# Patient Record
Sex: Female | Born: 1990 | Hispanic: Yes | Marital: Single | Smoking: Never smoker
Health system: Southern US, Community
[De-identification: ages and names within clinical notes are randomized; demographics above are authoritative.]

## PROBLEM LIST (undated history)

## (undated) ENCOUNTER — Inpatient Hospital Stay: Payer: Self-pay

## (undated) ENCOUNTER — Inpatient Hospital Stay (HOSPITAL_COMMUNITY): Payer: Self-pay

## (undated) DIAGNOSIS — IMO0002 Reserved for concepts with insufficient information to code with codable children: Secondary | ICD-10-CM

## (undated) DIAGNOSIS — R87619 Unspecified abnormal cytological findings in specimens from cervix uteri: Secondary | ICD-10-CM

## (undated) DIAGNOSIS — D649 Anemia, unspecified: Secondary | ICD-10-CM

## (undated) DIAGNOSIS — B977 Papillomavirus as the cause of diseases classified elsewhere: Secondary | ICD-10-CM

## (undated) HISTORY — PX: CHOLECYSTECTOMY: SHX55

## (undated) HISTORY — PX: CONDYLOMA EXCISION/FULGURATION: SHX1389

## (undated) HISTORY — PX: TONSILLECTOMY: SUR1361

## (undated) NOTE — *Deleted (*Deleted)
Preventive Care 21-39 Years Old, Female Preventive care refers to visits with your health care provider and lifestyle choices that can promote health and wellness. This includes:  A yearly physical exam. This may also be called an annual well check.  Regular dental visits and eye exams.  Immunizations.  Screening for certain conditions.  Healthy lifestyle choices, such as eating a healthy diet, getting regular exercise, not using drugs or products that contain nicotine and tobacco, and limiting alcohol use. What can I expect for my preventive care visit? Physical exam Your health care provider will check your:  Height and weight. This may be used to calculate body mass index (BMI), which tells if you are at a healthy weight.  Heart rate and blood pressure.  Skin for abnormal spots. Counseling Your health care provider may ask you questions about your:  Alcohol, tobacco, and drug use.  Emotional well-being.  Home and relationship well-being.  Sexual activity.  Eating habits.  Work and work environment.  Method of birth control.  Menstrual cycle.  Pregnancy history. What immunizations do I need?  Influenza (flu) vaccine  This is recommended every year. Tetanus, diphtheria, and pertussis (Tdap) vaccine  You may need a Td booster every 10 years. Varicella (chickenpox) vaccine  You may need this if you have not been vaccinated. Human papillomavirus (HPV) vaccine  If recommended by your health care provider, you may need three doses over 6 months. Measles, mumps, and rubella (MMR) vaccine  You may need at least one dose of MMR. You may also need a second dose. Meningococcal conjugate (MenACWY) vaccine  One dose is recommended if you are age 19-21 years and a first-year college student living in a residence hall, or if you have one of several medical conditions. You may also need additional booster doses. Pneumococcal conjugate (PCV13) vaccine  You may need  this if you have certain conditions and were not previously vaccinated. Pneumococcal polysaccharide (PPSV23) vaccine  You may need one or two doses if you smoke cigarettes or if you have certain conditions. Hepatitis A vaccine  You may need this if you have certain conditions or if you travel or work in places where you may be exposed to hepatitis A. Hepatitis B vaccine  You may need this if you have certain conditions or if you travel or work in places where you may be exposed to hepatitis B. Haemophilus influenzae type b (Hib) vaccine  You may need this if you have certain conditions. You may receive vaccines as individual doses or as more than one vaccine together in one shot (combination vaccines). Talk with your health care provider about the risks and benefits of combination vaccines. What tests do I need?  Blood tests  Lipid and cholesterol levels. These may be checked every 5 years starting at age 20.  Hepatitis C test.  Hepatitis B test. Screening  Diabetes screening. This is done by checking your blood sugar (glucose) after you have not eaten for a while (fasting).  Sexually transmitted disease (STD) testing.  BRCA-related cancer screening. This may be done if you have a family history of breast, ovarian, tubal, or peritoneal cancers.  Pelvic exam and Pap test. This may be done every 3 years starting at age 21. Starting at age 30, this may be done every 5 years if you have a Pap test in combination with an HPV test. Talk with your health care provider about your test results, treatment options, and if necessary, the need for more tests.   Follow these instructions at home: Eating and drinking   Eat a diet that includes fresh fruits and vegetables, whole grains, lean protein, and low-fat dairy.  Take vitamin and mineral supplements as recommended by your health care provider.  Do not drink alcohol if: ? Your health care provider tells you not to drink. ? You are  pregnant, may be pregnant, or are planning to become pregnant.  If you drink alcohol: ? Limit how much you have to 0-1 drink a day. ? Be aware of how much alcohol is in your drink. In the [place] one drink equals one 12 oz bottle of beer (355 mL), one 5 oz glass of wine (148 mL), or one 1 oz glass of hard liquor (44 mL). Lifestyle  Take daily care of your teeth and gums.  Stay active. Exercise for at least 30 minutes on 5 or more days each week.  Do not use any products that contain nicotine or tobacco, such as cigarettes, e-cigarettes, and chewing tobacco. If you need help quitting, ask your health care provider.  If you are sexually active, practice safe sex. Use a condom or other form of birth control (contraception) in order to prevent pregnancy and STIs (sexually transmitted infections). If you plan to become pregnant, see your health care provider for a preconception visit. What's next?  Visit your health care provider once a year for a well check visit.  Ask your health care provider how often you should have your eyes and teeth checked.  Stay up to date on all vaccines. This information is not intended to replace advice given to you by your health care provider. Make sure you discuss any questions you have with your health care provider. Document Revised: 05/07/2018 Document Reviewed: 05/07/2018 Elsevier Patient Education  2020 [employer] Breast Self-Awareness Breast self-awareness is knowing how your breasts look and feel. Doing breast self-awareness is important. It allows you to catch a breast problem early while it is still small and can be treated. All women should do breast self-awareness, including women who have had breast implants. Tell your doctor if you notice a change in your breasts. What you need:  A mirror.  A well-lit room. How to do a breast self-exam A breast self-exam is one way to learn what is normal for your breasts and to check for changes. To do a  breast self-exam: Look for changes  1. Take off all the clothes above your waist. 2. Stand in front of a mirror in a room with good lighting. 3. Put your hands on your hips. 4. Push your hands down. 5. Look at your breasts and nipples in the mirror to see if one breast or nipple looks different from the other. Check to see if: ? The shape of one breast is different. ? The size of one breast is different. ? There are wrinkles, dips, and bumps in one breast and not the other. 6. Look at each breast for changes in the skin, such as: ? Redness. ? Scaly areas. 7. Look for changes in your nipples, such as: ? Liquid around the nipples. ? Bleeding. ? Dimpling. ? Redness. ? A change in where the nipples are. Feel for changes  1. Lie on your back on the floor. 2. Feel each breast. To do this, follow these steps: ? Pick a breast to feel. ? Put the arm closest to that breast above your head. ? Use your other arm to feel the nipple area of your breast. Feel   the area with the pads of your three middle fingers by making small circles with your fingers. For the first circle, press lightly. For the second circle, press harder. For the third circle, press even harder. ? Keep making circles with your fingers at the different pressures as you move down your breast. Stop when you feel your ribs. ? Move your fingers a little toward the center of your body. ? Start making circles with your fingers again, this time going up until you reach your collarbone. ? Keep making up-and-down circles until you reach your armpit. Remember to keep using the three pressures. ? Feel the other breast in the same way. 3. Sit or stand in the tub or shower. 4. With soapy water on your skin, feel each breast the same way you did in step 2 when you were lying on the floor. Write down what you find Writing down what you find can help you remember what to tell your doctor. Write down:  What is normal for each breast.  Any  changes you find in each breast, including: ? The kind of changes you find. ? Whether you have pain. ? Size and location of any lumps.  When you last had your menstrual period. General tips  Check your breasts every month.  If you are breastfeeding, the best time to check your breasts is after you feed your baby or after you use a breast pump.  If you get menstrual periods, the best time to check your breasts is 5-7 days after your menstrual period is over.  With time, you will become comfortable with the self-exam, and you will begin to know if there are changes in your breasts. Contact a doctor if you:  See a change in the shape or size of your breasts or nipples.  See a change in the skin of your breast or nipples, such as red or scaly skin.  Have fluid coming from your nipples that is not normal.  Find a lump or thick area that was not there before.  Have pain in your breasts.  Have any concerns about your breast health. Summary  Breast self-awareness includes looking for changes in your breasts, as well as feeling for changes within your breasts.  Breast self-awareness should be done in front of a mirror in a well-lit room.  You should check your breasts every month. If you get menstrual periods, the best time to check your breasts is 5-7 days after your menstrual period is over.  Let your doctor know of any changes you see in your breasts, including changes in size, changes on the skin, pain or tenderness, or fluid from your nipples that is not normal. This information is not intended to replace advice given to you by your health care provider. Make sure you discuss any questions you have with your health care provider. Document Revised: 04/14/2018 Document Reviewed: 04/14/2018 Elsevier Patient Education  2020 Elsevier Inc.  

---

## 2010-05-19 ENCOUNTER — Inpatient Hospital Stay: Payer: Self-pay | Admitting: Unknown Physician Specialty

## 2011-03-03 ENCOUNTER — Emergency Department: Payer: Self-pay | Admitting: Internal Medicine

## 2011-03-16 ENCOUNTER — Emergency Department: Payer: Self-pay | Admitting: Emergency Medicine

## 2011-06-06 ENCOUNTER — Encounter: Payer: Self-pay | Admitting: Maternal & Fetal Medicine

## 2011-06-08 LAB — CBC
HCT: 33 % — AB (ref 36–46)
Hemoglobin: 11.1 g/dL — AB (ref 12.0–16.0)
Platelets: 260 10*3/uL (ref 150–399)

## 2011-06-08 LAB — ABO/RH: RH Type: POSITIVE

## 2011-06-08 LAB — CULTURE, OB URINE
Pap: ABNORMAL — AB
Urine Culture, OB: NO GROWTH
Urine Culture, OB: NO GROWTH

## 2011-06-08 LAB — GC/CHLAMYDIA PROBE AMP, GENITAL: Chlamydia: NEGATIVE

## 2011-08-19 ENCOUNTER — Ambulatory Visit: Payer: Self-pay | Admitting: Obstetrics and Gynecology

## 2011-08-23 ENCOUNTER — Ambulatory Visit: Payer: Self-pay | Admitting: Obstetrics and Gynecology

## 2011-08-27 LAB — PATHOLOGY REPORT

## 2011-09-10 NOTE — L&D Delivery Note (Signed)
Delivery Note At 6:20 PM a viable female was delivered via Vaginal, Spontaneous Delivery (Presentation: Left Occiput Anterior).  APGAR: 8, 9; weight 7 lb 8 oz (3402 g). No difficulty with del of shoulders.  Placenta status: Intact, Spontaneous.  Cord: 3 vessels; cord blood collected for CCBB as well as hospital sample  Anesthesia: None  Episiotomy: None Lacerations: L labial; 'skidmark' on R labia (not bldg; not repaired) Suture Repair: 3.0 vicryl Est. Blood Loss (mL): 350  Mom to postpartum.  Baby to nursery-stable.  Cam Hai 12/17/2011, 6:57 PM

## 2011-10-07 ENCOUNTER — Encounter (HOSPITAL_COMMUNITY): Payer: Self-pay

## 2011-10-07 ENCOUNTER — Inpatient Hospital Stay (HOSPITAL_COMMUNITY)
Admission: AD | Admit: 2011-10-07 | Discharge: 2011-10-07 | Disposition: A | Discharge status: Home / Self Care | Payer: Medicaid Other | Source: Ambulatory Visit | Attending: Obstetrics & Gynecology | Admitting: Obstetrics & Gynecology

## 2011-10-07 DIAGNOSIS — O99891 Other specified diseases and conditions complicating pregnancy: Secondary | ICD-10-CM | POA: Insufficient documentation

## 2011-10-07 DIAGNOSIS — J069 Acute upper respiratory infection, unspecified: Secondary | ICD-10-CM

## 2011-10-07 DIAGNOSIS — J029 Acute pharyngitis, unspecified: Secondary | ICD-10-CM | POA: Insufficient documentation

## 2011-10-07 HISTORY — DX: Unspecified abnormal cytological findings in specimens from cervix uteri: R87.619

## 2011-10-07 HISTORY — DX: Reserved for concepts with insufficient information to code with codable children: IMO0002

## 2011-10-07 HISTORY — DX: Papillomavirus as the cause of diseases classified elsewhere: B97.7

## 2011-10-07 HISTORY — DX: Anemia, unspecified: D64.9

## 2011-10-07 MED ORDER — DIPHENHYDRAMINE HCL 25 MG PO CAPS: 50.0000 mg | ORAL_CAPSULE | Freq: Every evening | ORAL | Status: DC | PRN

## 2011-10-07 MED ORDER — SALINE NASAL SPRAY 0.65 % NA SOLN: 1.0000 | NASAL | Status: DC | PRN

## 2011-10-07 NOTE — ED Provider Notes (Signed)
History     Chief Complaint  Patient presents with  . Sore Throat   HPI Ms. Kimberly Dunn is a 21 y/o G1P0 who presents today at 30w 5d with a cc of sore throat, congestion, and left ear pain.  She states that the symptoms began last Monday.  She has been taking tylenol without much relief. She is especially uncomfortable at night.  She has had a milld, unproductive cough.   She denies any sick contacts, fever, nausea, vomiting, or changes in bowel habits.     Past Medical History  Diagnosis Date  . Anemia   . Abnormal Pap smear   . HPV (human papilloma virus) infection     Past Surgical History  Procedure Date  . Tonsillectomy   . Cholecystectomy   . Condyloma excision/fulguration     Family History  Problem Relation Age of Onset  . Anesthesia problems Neg Hx     History  Substance Use Topics  . Smoking status: Never Smoker   . Smokeless tobacco: Never Used  . Alcohol Use: No    Allergies: No Known Allergies  Prescriptions prior to admission  Medication Sig Dispense Refill  . ferrous fumarate-iron polysaccharide complex (TANDEM) 162-115.2 MG CAPS Take 1 capsule by mouth daily with breakfast.      . Prenatal Vit-Fe Fumarate-FA (PRENATAL MULTIVITAMIN) TABS Take 1 tablet by mouth at bedtime.        Review of Systems  All other systems reviewed and are negative.   Physical Exam   Blood pressure 115/59, pulse 105, temperature 98.5 F (36.9 C), temperature source Oral, resp. rate 20, height 5' 7.25" (1.708 m), weight 100.88 kg (222 lb 6.4 oz), SpO2 98.00%.  Physical Exam  Constitutional: She appears well-developed and well-nourished.  HENT:  Head: Normocephalic and atraumatic.  Mouth/Throat: No oropharyngeal exudate.       Erythema and "cobblestone" appearance of Posterior oropharynx.  Eyes: EOM are normal. Pupils are equal, round, and reactive to light.  Neck: Neck supple.  Cardiovascular: Normal rate and regular rhythm.   Respiratory: Effort normal.    Lymphadenopathy:    She has no cervical adenopathy.  Skin: Skin is warm and dry.  Psychiatric: Her behavior is normal.    MAU Course  Procedures  MDM NST- FHT  140, reactive with good variablity.  No decels.   Assessment and Plan  A: Viral upper respiratory infection with post nasal drip and eustachian tube involvement. Strep unlikely by C[facility]OR criteria. P:  Continue with tylenol as needed.  Benadryl at night for sleep.  Saline spray for nose and salt water gargle. D/C home with precautions to return if fever/chills, reduced fetal movement, LOF, vaginal bleeding, or contractions >4-5/hour.   Arthor Captain 22/28/2013, 10:41 AM   Sharen Counter, CNM

## 2011-10-07 NOTE — Progress Notes (Signed)
Pt states sore throat started Thursday, now affecting left ear. Has taken tylenol for pain with no relief. Has h/a since Thursday as well.

## 2011-10-07 NOTE — ED Notes (Signed)
MWilliams CNM notified of pt in [facility]U for evaluation of upper respiratory congestion, headache, earache. Provider to see pt shortly.

## 2011-10-07 NOTE — Progress Notes (Signed)
Patient states she has had a sore throat with cough and left ear pain since 1-24. No fever or fever symptoms. States she has transferred her care from the [facility] to the [facility] at [facility]. Has her first appointment 1-30. Reports no problems with the pregnancy, reports good fetal movement.

## 2011-10-09 ENCOUNTER — Ambulatory Visit (INDEPENDENT_AMBULATORY_CARE_PROVIDER_SITE_OTHER): Payer: Self-pay | Admitting: Advanced Practice Midwife

## 2011-10-09 ENCOUNTER — Other Ambulatory Visit: Payer: Self-pay | Admitting: *Deleted

## 2011-10-09 ENCOUNTER — Encounter: Payer: Self-pay | Admitting: Advanced Practice Midwife

## 2011-10-09 DIAGNOSIS — D649 Anemia, unspecified: Secondary | ICD-10-CM | POA: Insufficient documentation

## 2011-10-09 DIAGNOSIS — Z3400 Encounter for supervision of normal first pregnancy, unspecified trimester: Secondary | ICD-10-CM | POA: Insufficient documentation

## 2011-10-09 DIAGNOSIS — IMO0002 Reserved for concepts with insufficient information to code with codable children: Secondary | ICD-10-CM | POA: Insufficient documentation

## 2011-10-09 DIAGNOSIS — R8789 Other abnormal findings in specimens from female genital organs: Secondary | ICD-10-CM

## 2011-10-09 DIAGNOSIS — B977 Papillomavirus as the cause of diseases classified elsewhere: Secondary | ICD-10-CM

## 2011-10-09 LAB — POCT URINALYSIS DIP (DEVICE)
Hgb urine dipstick: NEGATIVE
Ketones, ur: NEGATIVE mg/dL
Protein, ur: NEGATIVE mg/dL
Specific Gravity, Urine: 1.02 (ref 1.005–1.030)
Urobilinogen, UA: 0.2 mg/dL (ref 0.0–1.0)
pH: 7 (ref 5.0–8.0)

## 2011-10-09 MED ORDER — PRENATAL MULTIVITAMIN CH: 1.0000 | ORAL_TABLET | Freq: Every day | ORAL | Status: DC

## 2011-10-09 MED ORDER — DOCUSATE SODIUM 100 MG PO CAPS: 100.0000 mg | ORAL_CAPSULE | Freq: Two times a day (BID) | ORAL | Status: AC

## 2011-10-09 MED ORDER — FERROUS FUMARATE 325 (106 FE) MG PO TABS: 1.0000 | ORAL_TABLET | Freq: Every day | ORAL | Status: DC

## 2011-10-09 NOTE — Progress Notes (Signed)
Pulse: 100

## 2011-10-09 NOTE — Progress Notes (Signed)
New transfer from [place]. Records being sent. Doing well. Denies contractions. Bleeding or leaking. Discussed Iron and also discussed abn pap. Next pap due in March. Wants Gardasil after delivery.

## 2011-10-09 NOTE — Patient Instructions (Signed)
Normal Labor and Delivery Your caregiver must first be sure you are in labor. Signs of labor include:  You may pass what is called "the mucus plug" before labor begins. This is a small amount of blood stained mucus.   Regular uterine contractions.   The time between contractions get closer together.   The discomfort and pain gradually gets more intense.   Pains are mostly located in the back.   Pains get worse when walking.   The cervix (the opening of the uterus becomes thinner (begins to efface) and opens up (dilates).  Once you are in labor and admitted into the hospital or care center, your caregiver will do the following:  A complete physical examination.   Check your vital signs (blood pressure, pulse, temperature and the fetal heart rate).   Do a vaginal examination (using a sterile glove and lubricant) to determine:   The position (presentation) of the baby (head [vertex] or buttock first).   The level (station) of the baby's head in the birth canal.   The effacement and dilatation of the cervix.   You may have your pubic hair shaved and be given an enema depending on your caregiver and the circumstance.   An electronic monitor is usually placed on your abdomen. The monitor follows the length and intensity of the contractions, as well as the baby's heart rate.   Usually, your caregiver will insert an IV in your arm with a bottle of sugar water. This is done as a precaution so that medications can be given to you quickly during labor or delivery.  NORMAL LABOR AND DELIVERY IS DIVIDED UP INTO 3 STAGES: First Stage This is when regular contractions begin and the cervix begins to efface and dilate. This stage can last from 3 to 15 hours. The end of the first stage is when the cervix is 100% effaced and 10 centimeters dilated. Pain medications may be given by   Injection (morphine, demerol, etc.)   Regional anesthesia (spinal, caudal or epidural, anesthetics given in  different locations of the spine). Paracervical pain medication may be given, which is an injection of and anesthetic on each side of the cervix.  A pregnant woman may request to have "Natural Childbirth" which is not to have any medications or anesthesia during her labor and delivery. Second Stage This is when the baby comes down through the birth canal (vagina) and is born. This can take 1 to 4 hours. As the baby's head comes down through the birth canal, you may feel like you are going to have a bowel movement. You will get the urge to bear down and push until the baby is delivered. As the baby's head is being delivered, the caregiver will decide if an episiotomy (a cut in the perineum and vagina area) is needed to prevent tearing of the tissue in this area. The episiotomy is sewn up after the delivery of the baby and placenta. Sometimes a mask with nitrous oxide is given for the mother to breath during the delivery of the baby to help if there is too much pain. The end of Stage 2 is when the baby is fully delivered. Then when the umbilical cord stops pulsating it is clamped and cut. Third Stage The third stage begins after the baby is completely delivered and ends after the placenta (afterbirth) is delivered. This usually takes 5 to 30 minutes. After the placenta is delivered, a medication is given either by intravenous or injection to help contract   the uterus and prevent bleeding. The third stage is not painful and pain medication is usually not necessary. If an episiotomy was done, it is repaired at this time. After the delivery, the mother is watched and monitored closely for 1 to 2 hours to make sure there is no postpartum bleeding (hemorrhage). If there is a lot of bleeding, medication is given to contract the uterus and stop the bleeding. Document Released: 06/04/2008 Document Revised: 05/08/2011 Document Reviewed: 06/04/2008 [facility] Patient Information 2012 [place], [place].Pregnancy - Third  Trimester The third trimester of pregnancy (the last 3 months) is a period of the most rapid growth for you and your baby. The baby approaches a length of 20 inches and a weight of 6 to 10 pounds. The baby is adding on fat and getting ready for life outside your body. While inside, babies have periods of sleeping and waking, suck their thumbs, and hiccups. You can often feel small contractions of the uterus. This is false labor. It is also called Braxton-Hicks contractions. This is like a practice for labor. The usual problems in this stage of pregnancy include more difficulty breathing, swelling of the hands and feet from water retention, and having to urinate more often because of the uterus and baby pressing on your bladder.  PRENATAL EXAMS  Blood work may continue to be done during prenatal exams. These tests are done to check on your health and the probable health of your baby. Blood work is used to follow your blood levels (hemoglobin). Anemia (low hemoglobin) is common during pregnancy. Iron and vitamins are given to help prevent this. You may also continue to be checked for diabetes. Some of the past blood tests may be done again.   The size of the uterus is measured during each visit. This makes sure your baby is growing properly according to your pregnancy dates.   Your blood pressure is checked every prenatal visit. This is to make sure you are not getting toxemia.   Your urine is checked every prenatal visit for infection, diabetes and protein.   Your weight is checked at each visit. This is done to make sure gains are happening at the suggested rate and that you and your baby are growing normally.   Sometimes, an ultrasound is performed to confirm the position and the proper growth and development of the baby. This is a test done that bounces harmless sound waves off the baby so your caregiver can more accurately determine due dates.   Discuss the type of pain medication and anesthesia  you will have during your labor and delivery.   Discuss the possibility and anesthesia if a Cesarean Section might be necessary.   Inform your caregiver if there is any mental or physical violence at home.  Sometimes, a specialized non-stress test, contraction stress test and biophysical profile are done to make sure the baby is not having a problem. Checking the amniotic fluid surrounding the baby is called an amniocentesis. The amniotic fluid is removed by sticking a needle into the belly (abdomen). This is sometimes done near the end of pregnancy if an early delivery is required. In this case, it is done to help make sure the baby's lungs are mature enough for the baby to live outside of the womb. If the lungs are not mature and it is unsafe to deliver the baby, an injection of cortisone medication is given to the mother 1 to 2 days before the delivery. This helps the baby's lungs mature and  makes it safer to deliver the baby. CHANGES OCCURING IN THE THIRD TRIMESTER OF PREGNANCY Your body goes through many changes during pregnancy. They vary from person to person. Talk to your caregiver about changes you notice and are concerned about.  During the last trimester, you have probably had an increase in your appetite. It is normal to have cravings for certain foods. This varies from person to person and pregnancy to pregnancy.   You may begin to get stretch marks on your hips, abdomen, and breasts. These are normal changes in the body during pregnancy. There are no exercises or medications to take which prevent this change.   Constipation may be treated with a stool softener or adding bulk to your diet. Drinking lots of fluids, fiber in vegetables, fruits, and whole grains are helpful.   Exercising is also helpful. If you have been very active up until your pregnancy, most of these activities can be continued during your pregnancy. If you have been less active, it is helpful to start an exercise  program such as walking. Consult your caregiver before starting exercise programs.   Avoid all smoking, alcohol, un-prescribed drugs, herbs and "street drugs" during your pregnancy. These chemicals affect the formation and growth of the baby. Avoid chemicals throughout the pregnancy to ensure the delivery of a healthy infant.   Backache, varicose veins and hemorrhoids may develop or get worse.   You will tire more easily in the third trimester, which is normal.   The baby's movements may be stronger and more often.   You may become short of breath easily.   Your belly button may stick out.   A yellow discharge may leak from your breasts called colostrum.   You may have a bloody mucus discharge. This usually occurs a few days to a week before labor begins.  HOME CARE INSTRUCTIONS   Keep your caregiver's appointments. Follow your caregiver's instructions regarding medication use, exercise, and diet.   During pregnancy, you are providing food for you and your baby. Continue to eat regular, well-balanced meals. Choose foods such as meat, fish, milk and other low fat dairy products, vegetables, fruits, and whole-grain breads and cereals. Your caregiver will tell you of the ideal weight gain.   A physical sexual relationship may be continued throughout pregnancy if there are no other problems such as early (premature) leaking of amniotic fluid from the membranes, vaginal bleeding, or belly (abdominal) pain.   Exercise regularly if there are no restrictions. Check with your caregiver if you are unsure of the safety of your exercises. Greater weight gain will occur in the last 2 trimesters of pregnancy. Exercising helps:   Control your weight.   Get you in shape for labor and delivery.   You lose weight after you deliver.   Rest a lot with legs elevated, or as needed for leg cramps or low back pain.   Wear a good support or jogging bra for breast tenderness during pregnancy. This may help  if worn during sleep. Pads or tissues may be used in the bra if you are leaking colostrum.   Do not use hot tubs, steam rooms, or saunas.   Wear your seat belt when driving. This protects you and your baby if you are in an accident.   Avoid raw meat, cat litter boxes and soil used by cats. These carry germs that can cause birth defects in the baby.   It is easier to loose urine during pregnancy. Tightening up and  strengthening the pelvic muscles will help with this problem. You can practice stopping your urination while you are going to the bathroom. These are the same muscles you need to strengthen. It is also the muscles you would use if you were trying to stop from passing gas. You can practice tightening these muscles up 10 times a set and repeating this about 3 times per day. Once you know what muscles to tighten up, do not perform these exercises during urination. It is more likely to cause an infection by backing up the urine.   Ask for help if you have financial, counseling or nutritional needs during pregnancy. Your caregiver will be able to offer counseling for these needs as well as refer you for other special needs.   Make a list of emergency phone numbers and have them available.   Plan on getting help from family or friends when you go home from the hospital.   Make a trial run to the hospital.   Take prenatal classes with the father to understand, practice and ask questions about the labor and delivery.   Prepare the baby's room/nursery.   Do not travel out of the city unless it is absolutely necessary and with the advice of your caregiver.   Wear only low or no heal shoes to have better balance and prevent falling.  MEDICATIONS AND DRUG USE IN PREGNANCY  Take prenatal vitamins as directed. The vitamin should contain 1 milligram of folic acid. Keep all vitamins out of reach of children. Only a couple vitamins or tablets containing iron may be fatal to a baby or young child  when ingested.   Avoid use of all medications, including herbs, over-the-counter medications, not prescribed or suggested by your caregiver. Only take over-the-counter or prescription medicines for pain, discomfort, or fever as directed by your caregiver. Do not use aspirin, ibuprofen (Motrin, Advil, Nuprin) or naproxen (Aleve) unless OK'd by your caregiver.   Let your caregiver also know about herbs you may be using.   Alcohol is related to a number of birth defects. This includes fetal alcohol syndrome. All alcohol, in any form, should be avoided completely. Smoking will cause low birth rate and premature babies.   Street/illegal drugs are very harmful to the baby. They are absolutely forbidden. A baby born to an addicted mother will be addicted at birth. The baby will go through the same withdrawal an adult does.  SEEK MEDICAL CARE IF: You have any concerns or worries during your pregnancy. It is better to call with your questions if you feel they cannot wait, rather than worry about them. DECISIONS ABOUT CIRCUMCISION You may or may not know the sex of your baby. If you know your baby is a boy, it may be time to think about circumcision. Circumcision is the removal of the foreskin of the penis. This is the skin that covers the sensitive end of the penis. There is no proven medical need for this. Often this decision is made on what is popular at the time or based upon religious beliefs and social issues. You can discuss these issues with your caregiver or pediatrician. SEEK IMMEDIATE MEDICAL CARE IF:   An unexplained oral temperature above 102 F (38.9 C) develops, or as your caregiver suggests.   You have leaking of fluid from the vagina (birth canal). If leaking membranes are suspected, take your temperature and tell your caregiver of this when you call.   There is vaginal spotting, bleeding or passing clots. Tell  your caregiver of the amount and how many pads are used.   You develop a  bad smelling vaginal discharge with a change in the color from clear to white.   You develop vomiting that lasts more than 24 hours.   You develop chills or fever.   You develop shortness of breath.   You develop burning on urination.   You loose more than 2 pounds of weight or gain more than 2 pounds of weight or as suggested by your caregiver.   You notice sudden swelling of your face, hands, and feet or legs.   You develop belly (abdominal) pain. Round ligament discomfort is a common non-cancerous (benign) cause of abdominal pain in pregnancy. Your caregiver still must evaluate you.   You develop a severe headache that does not go away.   You develop visual problems, blurred or double vision.   If you have not felt your baby move for more than 1 hour. If you think the baby is not moving as much as usual, eat something with sugar in it and lie down on your left side for an hour. The baby should move at least 4 to 5 times per hour. Call right away if your baby moves less than that.   You fall, are in a car accident or any kind of trauma.   There is mental or physical violence at home.  Document Released: 08/20/2001 Document Revised: 05/08/2011 Document Reviewed: 02/22/2009 [facility] Patient Information 2012 [place], [place].

## 2011-10-14 ENCOUNTER — Encounter: Payer: Self-pay | Admitting: Obstetrics & Gynecology

## 2011-10-14 ENCOUNTER — Telehealth: Payer: Self-pay | Admitting: *Deleted

## 2011-10-14 DIAGNOSIS — O99340 Other mental disorders complicating pregnancy, unspecified trimester: Secondary | ICD-10-CM | POA: Insufficient documentation

## 2011-10-14 DIAGNOSIS — Z3400 Encounter for supervision of normal first pregnancy, unspecified trimester: Secondary | ICD-10-CM

## 2011-10-14 DIAGNOSIS — IMO0002 Reserved for concepts with insufficient information to code with codable children: Secondary | ICD-10-CM | POA: Insufficient documentation

## 2011-10-14 NOTE — Telephone Encounter (Signed)
Pt left message stating she has dental appt tomorrow and would like a dental referral letter faxed to [facility] is staying.   Fax #  [phone], she can be reached @ [phone] if there are any questions.

## 2011-10-18 ENCOUNTER — Telehealth: Payer: Self-pay | Admitting: Obstetrics and Gynecology

## 2011-10-18 NOTE — Telephone Encounter (Signed)
Patient called to verify the Iron Pills she was prescribed for. Called and spelled out the Iron pills she have listed under Med list. Pt. Satisfied.

## 2011-10-23 ENCOUNTER — Encounter: Payer: Self-pay | Admitting: Physician Assistant

## 2011-10-23 ENCOUNTER — Ambulatory Visit (INDEPENDENT_AMBULATORY_CARE_PROVIDER_SITE_OTHER): Payer: Medicaid Other | Admitting: Physician Assistant

## 2011-10-23 VITALS — BP 126/76 | Temp 97.8°F | Wt 229.1 lb

## 2011-10-23 DIAGNOSIS — O99340 Other mental disorders complicating pregnancy, unspecified trimester: Secondary | ICD-10-CM

## 2011-10-23 DIAGNOSIS — F489 Nonpsychotic mental disorder, unspecified: Secondary | ICD-10-CM

## 2011-10-23 LAB — POCT URINALYSIS DIP (DEVICE)
Bilirubin Urine: NEGATIVE
Glucose, UA: NEGATIVE mg/dL
Hgb urine dipstick: NEGATIVE
Ketones, ur: NEGATIVE mg/dL
Nitrite: NEGATIVE
Protein, ur: NEGATIVE mg/dL
Specific Gravity, Urine: 1.02 (ref 1.005–1.030)
Urobilinogen, UA: 0.2 mg/dL (ref 0.0–1.0)
pH: 7 (ref 5.0–8.0)

## 2011-10-23 NOTE — Patient Instructions (Signed)
Breastfeeding BENEFITS OF BREASTFEEDING For the baby  The first milk (colostrum) helps the baby's digestive system function better.   There are antibodies from the mother in the milk that help the baby fight off infections.   The baby has a lower incidence of asthma, allergies, and SIDS (sudden infant death syndrome).   The nutrients in breast milk are better than formulas for the baby and helps the baby's brain grow better.   Babies who breastfeed have less gas, colic, and constipation.  For the mother  Breastfeeding helps develop a very special bond between mother and baby.   It is more convenient, always available at the correct temperature and cheaper than formula feeding.   It burns calories in the mother and helps with losing weight that was gained during pregnancy.   It makes the uterus contract back down to normal size faster and slows bleeding following delivery.   Breastfeeding mothers have a lower risk of developing breast cancer.  NURSE FREQUENTLY  A healthy, full-term baby may breastfeed as often as every hour or space his or her feedings to every 3 hours.   How often to nurse will vary from baby to baby. Watch your baby for signs of hunger, not the clock.   Nurse as often as the baby requests, or when you feel the need to reduce the fullness of your breasts.   Awaken the baby if it has been 3 to 4 hours since the last feeding.   Frequent feeding will help the mother make more milk and will prevent problems like sore nipples and engorgement of the breasts.  BABY'S POSITION AT THE BREAST  Whether lying down or sitting, be sure that the baby's tummy is facing your tummy.   Support the breast with 4 fingers underneath the breast and the thumb above. Make sure your fingers are well away from the nipple and baby's mouth.   Stroke the baby's lips and cheek closest to the breast gently with your finger or nipple.   When the baby's mouth is open wide enough, place  all of your nipple and as much of the dark area around the nipple as possible into your baby's mouth.   Pull the baby in close so the tip of the nose and the baby's cheeks touch the breast during the feeding.  FEEDINGS  The length of each feeding varies from baby to baby and from feeding to feeding.   The baby must suck about 2 to 3 minutes for your milk to get to him or her. This is called a "let down." For this reason, allow the baby to feed on each breast as long as he or she wants. Your baby will end the feeding when he or she has received the right balance of nutrients.   To break the suction, put your finger into the corner of the baby's mouth and slide it between his or her gums before removing your breast from his or her mouth. This will help prevent sore nipples.  REDUCING BREAST ENGORGEMENT  In the first week after your baby is born, you may experience signs of breast engorgement. When breasts are engorged, they feel heavy, warm, full, and may be tender to the touch. You can reduce engorgement if you:   Nurse frequently, every 2 to 3 hours. Mothers who breastfeed early and often have fewer problems with engorgement.   Place light ice packs on your breasts between feedings. This reduces swelling. Wrap the ice packs in a   lightweight towel to protect your skin.   Apply moist hot packs to your breast for 5 to 10 minutes before each feeding. This increases circulation and helps the milk flow.   Gently massage your breast before and during the feeding.   Make sure that the baby empties at least one breast at every feeding before switching sides.   Use a breast pump to empty the breasts if your baby is sleepy or not nursing well. You may also want to pump if you are returning to work or or you feel you are getting engorged.   Avoid bottle feeds, pacifiers or supplemental feedings of water or juice in place of breastfeeding.   Be sure the baby is latched on and positioned properly while  breastfeeding.   Prevent fatigue, stress, and anemia.   Wear a supportive bra, avoiding underwire styles.   Eat a balanced diet with enough fluids.  If you follow these suggestions, your engorgement should improve in 24 to 48 hours. If you are still experiencing difficulty, call your lactation consultant or caregiver. IS MY BABY GETTING ENOUGH MILK? Sometimes, mothers worry about whether their babies are getting enough milk. You can be assured that your baby is getting enough milk if:  The baby is actively sucking and you hear swallowing.   The baby nurses at least 8 to 12 times in a 24 hour time period. Nurse your baby until he or she unlatches or falls asleep at the first breast (at least 10 to 20 minutes), then offer the second side.   The baby is wetting 5 to 6 disposable diapers (6 to 8 cloth diapers) in a 24 hour period by 5 to 6 days of age.   The baby is having at least 2 to 3 stools every 24 hours for the first few months. Breast milk is all the food your baby needs. It is not necessary for your baby to have water or formula. In fact, to help your breasts make more milk, it is best not to give your baby supplemental feedings during the early weeks.   The stool should be soft and yellow.   The baby should gain 4 to 7 ounces per week after he is 4 days old.  TAKE CARE OF YOURSELF Take care of your breasts by:  Bathing or showering daily.   Avoiding the use of soaps on your nipples.   Start feedings on your left breast at one feeding and on your right breast at the next feeding.   You will notice an increase in your milk supply 2 to 5 days after delivery. You may feel some discomfort from engorgement, which makes your breasts very firm and often tender. Engorgement "peaks" out within 24 to 48 hours. In the meantime, apply warm moist towels to your breasts for 5 to 10 minutes before feeding. Gentle massage and expression of some milk before feeding will soften your breasts, making  it easier for your baby to latch on. Wear a well fitting nursing bra and air dry your nipples for 10 to 15 minutes after each feeding.   Only use cotton bra pads.   Only use pure lanolin on your nipples after nursing. You do not need to wash it off before nursing.  Take care of yourself by:   Eating well-balanced meals and nutritious snacks.   Drinking milk, fruit juice, and water to satisfy your thirst (about 8 glasses a day).   Getting plenty of rest.   Increasing calcium in   your diet (1200 mg a day).   Avoiding foods that you notice affect the baby in a bad way.  SEEK MEDICAL CARE IF:   You have any questions or difficulty with breastfeeding.   You need help.   You have a hard, red, sore area on your breast, accompanied by a fever of 100.5 F (38.1 C) or more.   Your baby is too sleepy to eat well or is having trouble sleeping.   Your baby is wetting less than 6 diapers per day, by 5 days of age.   Your baby's skin or white part of his or her eyes is more yellow than it was in the hospital.   You feel depressed.  Document Released: 08/26/2005 Document Revised: 05/08/2011 Document Reviewed: 04/10/2009 [employer] Patient Information 2012 [employer]. 

## 2011-10-23 NOTE — Progress Notes (Signed)
Comfort measures reviewed for RLP and Sciatica. Discussed breastfeeding. GBS at next visit

## 2011-11-11 ENCOUNTER — Telehealth: Payer: Self-pay | Admitting: *Deleted

## 2011-11-11 DIAGNOSIS — IMO0002 Reserved for concepts with insufficient information to code with codable children: Secondary | ICD-10-CM

## 2011-11-11 DIAGNOSIS — D649 Anemia, unspecified: Secondary | ICD-10-CM

## 2011-11-11 DIAGNOSIS — O99340 Other mental disorders complicating pregnancy, unspecified trimester: Secondary | ICD-10-CM

## 2011-11-11 NOTE — Telephone Encounter (Signed)
Spoke with patient and advised her that she can take tylenol as needed for pain and claritin for nasal symptoms. Pt agrees and will followup on Wednesday as planned.

## 2011-11-11 NOTE — Telephone Encounter (Signed)
Pt left a message stating that she is having nasal congestion and a sore throat. Would like to know if she needs to come in for a visit, or what she can do.

## 2011-11-13 ENCOUNTER — Other Ambulatory Visit (HOSPITAL_COMMUNITY)
Admission: RE | Admit: 2011-11-13 | Discharge: 2011-11-13 | Disposition: A | Discharge status: Home / Self Care | Payer: Medicaid Other | Source: Ambulatory Visit | Attending: Advanced Practice Midwife | Admitting: Advanced Practice Midwife

## 2011-11-13 ENCOUNTER — Telehealth: Payer: Self-pay | Admitting: *Deleted

## 2011-11-13 ENCOUNTER — Ambulatory Visit (INDEPENDENT_AMBULATORY_CARE_PROVIDER_SITE_OTHER): Payer: Medicaid Other | Admitting: Advanced Practice Midwife

## 2011-11-13 DIAGNOSIS — Z113 Encounter for screening for infections with a predominantly sexual mode of transmission: Secondary | ICD-10-CM | POA: Insufficient documentation

## 2011-11-13 DIAGNOSIS — O99340 Other mental disorders complicating pregnancy, unspecified trimester: Secondary | ICD-10-CM

## 2011-11-13 DIAGNOSIS — Z01419 Encounter for gynecological examination (general) (routine) without abnormal findings: Secondary | ICD-10-CM | POA: Insufficient documentation

## 2011-11-13 DIAGNOSIS — Z3400 Encounter for supervision of normal first pregnancy, unspecified trimester: Secondary | ICD-10-CM

## 2011-11-13 DIAGNOSIS — F489 Nonpsychotic mental disorder, unspecified: Secondary | ICD-10-CM

## 2011-11-13 LAB — POCT URINALYSIS DIP (DEVICE)
Glucose, UA: NEGATIVE mg/dL
Ketones, ur: NEGATIVE mg/dL
Protein, ur: NEGATIVE mg/dL
Specific Gravity, Urine: 1.02 (ref 1.005–1.030)
Urobilinogen, UA: 1 mg/dL (ref 0.0–1.0)

## 2011-11-13 LAB — GC/CHLAMYDIA PROBE AMP, GENITAL: Gonorrhea: NEGATIVE

## 2011-11-13 LAB — HIV ANTIBODY (ROUTINE TESTING W REFLEX): HIV: NONREACTIVE

## 2011-11-13 NOTE — Patient Instructions (Signed)
Pregnancy - Third Trimester The third trimester of pregnancy (the last 3 months) is a period of the most rapid growth for you and your baby. The baby approaches a length of 20 inches and a weight of 6 to 10 pounds. The baby is adding on fat and getting ready for life outside your body. While inside, babies have periods of sleeping and waking, suck their thumbs, and hiccups. You can often feel small contractions of the uterus. This is false labor. It is also called Braxton-Hicks contractions. This is like a practice for labor. The usual problems in this stage of pregnancy include more difficulty breathing, swelling of the hands and feet from water retention, and having to urinate more often because of the uterus and baby pressing on your bladder.  PRENATAL EXAMS  Blood work may continue to be done during prenatal exams. These tests are done to check on your health and the probable health of your baby. Blood work is used to follow your blood levels (hemoglobin). Anemia (low hemoglobin) is common during pregnancy. Iron and vitamins are given to help prevent this. You may also continue to be checked for diabetes. Some of the past blood tests may be done again.   The size of the uterus is measured during each visit. This makes sure your baby is growing properly according to your pregnancy dates.   Your blood pressure is checked every prenatal visit. This is to make sure you are not getting toxemia.   Your urine is checked every prenatal visit for infection, diabetes and protein.   Your weight is checked at each visit. This is done to make sure gains are happening at the suggested rate and that you and your baby are growing normally.   Sometimes, an ultrasound is performed to confirm the position and the proper growth and development of the baby. This is a test done that bounces harmless sound waves off the baby so your caregiver can more accurately determine due dates.   Discuss the type of pain  medication and anesthesia you will have during your labor and delivery.   Discuss the possibility and anesthesia if a Cesarean Section might be necessary.   Inform your caregiver if there is any mental or physical violence at home.  Sometimes, a specialized non-stress test, contraction stress test and biophysical profile are done to make sure the baby is not having a problem. Checking the amniotic fluid surrounding the baby is called an amniocentesis. The amniotic fluid is removed by sticking a needle into the belly (abdomen). This is sometimes done near the end of pregnancy if an early delivery is required. In this case, it is done to help make sure the baby's lungs are mature enough for the baby to live outside of the womb. If the lungs are not mature and it is unsafe to deliver the baby, an injection of cortisone medication is given to the mother 1 to 2 days before the delivery. This helps the baby's lungs mature and makes it safer to deliver the baby. CHANGES OCCURING IN THE THIRD TRIMESTER OF PREGNANCY Your body goes through many changes during pregnancy. They vary from person to person. Talk to your caregiver about changes you notice and are concerned about.  During the last trimester, you have probably had an increase in your appetite. It is normal to have cravings for certain foods. This varies from person to person and pregnancy to pregnancy.   You may begin to get stretch marks on your hips,   abdomen, and breasts. These are normal changes in the body during pregnancy. There are no exercises or medications to take which prevent this change.   Constipation may be treated with a stool softener or adding bulk to your diet. Drinking lots of fluids, fiber in vegetables, fruits, and whole grains are helpful.   Exercising is also helpful. If you have been very active up until your pregnancy, most of these activities can be continued during your pregnancy. If you have been less active, it is helpful  to start an exercise program such as walking. Consult your caregiver before starting exercise programs.   Avoid all smoking, alcohol, un-prescribed drugs, herbs and "street drugs" during your pregnancy. These chemicals affect the formation and growth of the baby. Avoid chemicals throughout the pregnancy to ensure the delivery of a healthy infant.   Backache, varicose veins and hemorrhoids may develop or get worse.   You will tire more easily in the third trimester, which is normal.   The baby's movements may be stronger and more often.   You may become short of breath easily.   Your belly button may stick out.   A yellow discharge may leak from your breasts called colostrum.   You may have a bloody mucus discharge. This usually occurs a few days to a week before labor begins.  HOME CARE INSTRUCTIONS   Keep your caregiver's appointments. Follow your caregiver's instructions regarding medication use, exercise, and diet.   During pregnancy, you are providing food for you and your baby. Continue to eat regular, well-balanced meals. Choose foods such as meat, fish, milk and other low fat dairy products, vegetables, fruits, and whole-grain breads and cereals. Your caregiver will tell you of the ideal weight gain.   A physical sexual relationship may be continued throughout pregnancy if there are no other problems such as early (premature) leaking of amniotic fluid from the membranes, vaginal bleeding, or belly (abdominal) pain.   Exercise regularly if there are no restrictions. Check with your caregiver if you are unsure of the safety of your exercises. Greater weight gain will occur in the last 2 trimesters of pregnancy. Exercising helps:   Control your weight.   Get you in shape for labor and delivery.   You lose weight after you deliver.   Rest a lot with legs elevated, or as needed for leg cramps or low back pain.   Wear a good support or jogging bra for breast tenderness during  pregnancy. This may help if worn during sleep. Pads or tissues may be used in the bra if you are leaking colostrum.   Do not use hot tubs, steam rooms, or saunas.   Wear your seat belt when driving. This protects you and your baby if you are in an accident.   Avoid raw meat, cat litter boxes and soil used by cats. These carry germs that can cause birth defects in the baby.   It is easier to loose urine during pregnancy. Tightening up and strengthening the pelvic muscles will help with this problem. You can practice stopping your urination while you are going to the bathroom. These are the same muscles you need to strengthen. It is also the muscles you would use if you were trying to stop from passing gas. You can practice tightening these muscles up 10 times a set and repeating this about 3 times per day. Once you know what muscles to tighten up, do not perform these exercises during urination. It is more likely   to cause an infection by backing up the urine.   Ask for help if you have financial, counseling or nutritional needs during pregnancy. Your caregiver will be able to offer counseling for these needs as well as refer you for other special needs.   Make a list of emergency phone numbers and have them available.   Plan on getting help from family or friends when you go home from the hospital.   Make a trial run to the hospital.   Take prenatal classes with the father to understand, practice and ask questions about the labor and delivery.   Prepare the baby's room/nursery.   Do not travel out of the city unless it is absolutely necessary and with the advice of your caregiver.   Wear only low or no heal shoes to have better balance and prevent falling.  MEDICATIONS AND DRUG USE IN PREGNANCY  Take prenatal vitamins as directed. The vitamin should contain 1 milligram of folic acid. Keep all vitamins out of reach of children. Only a couple vitamins or tablets containing iron may be fatal  to a baby or young child when ingested.   Avoid use of all medications, including herbs, over-the-counter medications, not prescribed or suggested by your caregiver. Only take over-the-counter or prescription medicines for pain, discomfort, or fever as directed by your caregiver. Do not use aspirin, ibuprofen (Motrin, Advil, Nuprin) or naproxen (Aleve) unless OK'd by your caregiver.   Let your caregiver also know about herbs you may be using.   Alcohol is related to a number of birth defects. This includes fetal alcohol syndrome. All alcohol, in any form, should be avoided completely. Smoking will cause low birth rate and premature babies.   Street/illegal drugs are very harmful to the baby. They are absolutely forbidden. A baby born to an addicted mother will be addicted at birth. The baby will go through the same withdrawal an adult does.  SEEK MEDICAL CARE IF: You have any concerns or worries during your pregnancy. It is better to call with your questions if you feel they cannot wait, rather than worry about them. DECISIONS ABOUT CIRCUMCISION You may or may not know the sex of your baby. If you know your baby is a boy, it may be time to think about circumcision. Circumcision is the removal of the foreskin of the penis. This is the skin that covers the sensitive end of the penis. There is no proven medical need for this. Often this decision is made on what is popular at the time or based upon religious beliefs and social issues. You can discuss these issues with your caregiver or pediatrician. SEEK IMMEDIATE MEDICAL CARE IF:   An unexplained oral temperature above 102 F (38.9 C) develops, or as your caregiver suggests.   You have leaking of fluid from the vagina (birth canal). If leaking membranes are suspected, take your temperature and tell your caregiver of this when you call.   There is vaginal spotting, bleeding or passing clots. Tell your caregiver of the amount and how many pads are  used.   You develop a bad smelling vaginal discharge with a change in the color from clear to white.   You develop vomiting that lasts more than 24 hours.   You develop chills or fever.   You develop shortness of breath.   You develop burning on urination.   You loose more than 2 pounds of weight or gain more than 2 pounds of weight or as suggested by your   caregiver.   You notice sudden swelling of your face, hands, and feet or legs.   You develop belly (abdominal) pain. Round ligament discomfort is a common non-cancerous (benign) cause of abdominal pain in pregnancy. Your caregiver still must evaluate you.   You develop a severe headache that does not go away.   You develop visual problems, blurred or double vision.   If you have not felt your baby move for more than 1 hour. If you think the baby is not moving as much as usual, eat something with sugar in it and lie down on your left side for an hour. The baby should move at least 4 to 5 times per hour. Call right away if your baby moves less than that.   You fall, are in a car accident or any kind of trauma.   There is mental or physical violence at home.  Document Released: 08/20/2001 Document Revised: 08/15/2011 Document Reviewed: 02/22/2009 [employer] Patient Information 2012 [employer]. 

## 2011-11-13 NOTE — Telephone Encounter (Signed)
Kimberly Dunn from [facility] left message requesting information to be faxed of the OTC meds [name] is able to take. I prepared written letter and faxed as requested to [phone].  Copy to be scanned to media tab.

## 2011-11-13 NOTE — Progress Notes (Signed)
Edema- legs.  Pressure/pain- ligament pain.  "has lots of discharge I have to change often" Pt requests HIV testing

## 2011-11-13 NOTE — Progress Notes (Signed)
GBS done

## 2011-11-16 LAB — CULTURE, BETA STREP (GROUP B ONLY)

## 2011-11-20 ENCOUNTER — Ambulatory Visit (INDEPENDENT_AMBULATORY_CARE_PROVIDER_SITE_OTHER): Payer: Medicaid Other | Admitting: Family Medicine

## 2011-11-20 VITALS — BP 113/69 | Temp 98.3°F | Wt 235.9 lb

## 2011-11-20 DIAGNOSIS — IMO0002 Reserved for concepts with insufficient information to code with codable children: Secondary | ICD-10-CM

## 2011-11-20 DIAGNOSIS — B977 Papillomavirus as the cause of diseases classified elsewhere: Secondary | ICD-10-CM

## 2011-11-20 DIAGNOSIS — R8789 Other abnormal findings in specimens from female genital organs: Secondary | ICD-10-CM

## 2011-11-20 DIAGNOSIS — O99340 Other mental disorders complicating pregnancy, unspecified trimester: Secondary | ICD-10-CM

## 2011-11-20 LAB — POCT URINALYSIS DIP (DEVICE)
Bilirubin Urine: NEGATIVE
Ketones, ur: NEGATIVE mg/dL
Nitrite: NEGATIVE
Protein, ur: NEGATIVE mg/dL
pH: 7.5 (ref 5.0–8.0)

## 2011-11-20 NOTE — Progress Notes (Signed)
Edema- feet.  Pain/pressure- lower back, "cramps"  Pulse- 99

## 2011-11-20 NOTE — Progress Notes (Signed)
S: Doing well.  Reports good fetal movement.  Occasional Braxton-Hicks.  Thin white vaginal discharge, no odor.  No vaginal bleeding or loss of fluid.  O: vitals reviewed FH 37cm FHT 49  A/P: 21 year old G1P0 at [redacted]w[redacted]d -urine GC/Chlamydia today -labor precautions reviewed -continue prenatal vitamin -follow up in 1 week

## 2011-11-20 NOTE — Patient Instructions (Signed)

## 2011-11-21 ENCOUNTER — Inpatient Hospital Stay (HOSPITAL_COMMUNITY): Payer: Medicaid Other

## 2011-11-21 ENCOUNTER — Observation Stay (HOSPITAL_COMMUNITY)
Admission: AD | Admit: 2011-11-21 | Discharge: 2011-11-23 | DRG: 781 | Disposition: A | Discharge status: Home / Self Care | Payer: Medicaid Other | Source: Ambulatory Visit | Attending: Obstetrics & Gynecology | Admitting: Obstetrics & Gynecology

## 2011-11-21 ENCOUNTER — Encounter (HOSPITAL_COMMUNITY): Payer: Self-pay | Admitting: *Deleted

## 2011-11-21 DIAGNOSIS — W19XXXA Unspecified fall, initial encounter: Secondary | ICD-10-CM

## 2011-11-21 DIAGNOSIS — D649 Anemia, unspecified: Secondary | ICD-10-CM | POA: Diagnosis present

## 2011-11-21 DIAGNOSIS — S99929A Unspecified injury of unspecified foot, initial encounter: Secondary | ICD-10-CM

## 2011-11-21 DIAGNOSIS — O9A219 Injury, poisoning and certain other consequences of external causes complicating pregnancy, unspecified trimester: Secondary | ICD-10-CM

## 2011-11-21 DIAGNOSIS — M545 Low back pain, unspecified: Secondary | ICD-10-CM | POA: Diagnosis present

## 2011-11-21 DIAGNOSIS — O99891 Other specified diseases and conditions complicating pregnancy: Principal | ICD-10-CM | POA: Diagnosis present

## 2011-11-21 DIAGNOSIS — O479 False labor, unspecified: Secondary | ICD-10-CM | POA: Diagnosis present

## 2011-11-21 DIAGNOSIS — O99019 Anemia complicating pregnancy, unspecified trimester: Secondary | ICD-10-CM | POA: Diagnosis present

## 2011-11-21 DIAGNOSIS — IMO0002 Reserved for concepts with insufficient information to code with codable children: Secondary | ICD-10-CM | POA: Diagnosis present

## 2011-11-21 DIAGNOSIS — O99340 Other mental disorders complicating pregnancy, unspecified trimester: Secondary | ICD-10-CM

## 2011-11-21 DIAGNOSIS — R109 Unspecified abdominal pain: Secondary | ICD-10-CM | POA: Diagnosis present

## 2011-11-21 DIAGNOSIS — M25579 Pain in unspecified ankle and joints of unspecified foot: Secondary | ICD-10-CM | POA: Diagnosis present

## 2011-11-21 DIAGNOSIS — T1490XA Injury, unspecified, initial encounter: Secondary | ICD-10-CM

## 2011-11-21 DIAGNOSIS — S99912A Unspecified injury of left ankle, initial encounter: Secondary | ICD-10-CM

## 2011-11-21 DIAGNOSIS — R296 Repeated falls: Secondary | ICD-10-CM | POA: Diagnosis present

## 2011-11-21 LAB — GC/CHLAMYDIA PROBE AMP, URINE: Chlamydia, Swab/Urine, PCR: NEGATIVE

## 2011-11-21 MED ORDER — FERROUS FUMARATE 325 (106 FE) MG PO TABS
1.0000 | ORAL_TABLET | Freq: Every day | ORAL | Status: DC
  Administered 2011-11-21 – 2011-11-22 (×2): 106 mg via ORAL
  Filled 2011-11-21 (×2): qty 1

## 2011-11-21 MED ORDER — PRENATAL MULTIVITAMIN CH: 1.0000 | ORAL_TABLET | Freq: Every day | ORAL | Status: DC

## 2011-11-21 MED ORDER — ZOLPIDEM TARTRATE 10 MG PO TABS: 10.0000 mg | ORAL_TABLET | Freq: Every evening | ORAL | Status: DC | PRN

## 2011-11-21 MED ORDER — ACETAMINOPHEN 325 MG PO TABS
650.0000 mg | ORAL_TABLET | ORAL | Status: DC | PRN
  Administered 2011-11-21: 650 mg via ORAL
  Filled 2011-11-21: qty 2

## 2011-11-21 MED ORDER — FERROUS FUMARATE 325 (106 FE) MG PO TABS: 1.0000 | ORAL_TABLET | Freq: Every day | ORAL | Status: DC

## 2011-11-21 MED ORDER — PRENATAL MULTIVITAMIN CH
1.0000 | ORAL_TABLET | Freq: Every day | ORAL | Status: DC
  Administered 2011-11-21 – 2011-11-22 (×2): 1 via ORAL
  Filled 2011-11-21 (×2): qty 1

## 2011-11-21 MED ORDER — DOCUSATE SODIUM 100 MG PO CAPS: 100.0000 mg | ORAL_CAPSULE | Freq: Every day | ORAL | Status: DC

## 2011-11-21 MED ORDER — DOCUSATE SODIUM 100 MG PO CAPS
100.0000 mg | ORAL_CAPSULE | Freq: Every day | ORAL | Status: DC
  Administered 2011-11-21 – 2011-11-22 (×2): 100 mg via ORAL
  Filled 2011-11-21 (×2): qty 1

## 2011-11-21 MED ORDER — CALCIUM CARBONATE ANTACID 500 MG PO CHEW: 2.0000 | CHEWABLE_TABLET | ORAL | Status: DC | PRN

## 2011-11-21 NOTE — Progress Notes (Signed)
Pt turned her ankle and fell down on her knee today causing an abrasion to her right knee

## 2011-11-21 NOTE — MAU Provider Note (Signed)
Attestation of Attending Supervision of Advanced Practitioner: Evaluation and management procedures were performed by the PA/NP/CNM/OB Fellow under my supervision/collaboration. Chart reviewed, and agree with management.  Plan is to obtain obstetric ultrasound and monitor overnight given her abdominal pain and contractions  Jaynie Collins, M.D. 11/21/2011 8:46 PM

## 2011-11-21 NOTE — Progress Notes (Signed)
Pt admission due to a fall

## 2011-11-21 NOTE — Progress Notes (Signed)
Chart review done.  Agree with resident note.   

## 2011-11-21 NOTE — H&P (Signed)
CSN: [id number]  Arrival date and time: 11/21/11 1815  First Provider Initiated Contact with Patient 11/21/11 1838  Chief Complaint   Patient presents with   .  Fall    HPI  This is a 21 y.o. at [redacted]w[redacted]d who presents s/p fall today. She stepped off curve and twisted her left ankle, falling onto her right knee and hands. Did not strike belly. Does report pain in upper abdomen with tightening and low back pain. No leaking or bleeding.  OB History    Grav  Para  Term  Preterm  Abortions  TAB  SAB  Ect  Mult  Living    1               Past Medical History   Diagnosis  Date   .  Anemia    .  Abnormal Pap smear    .  HPV (human papilloma virus) infection     Past Surgical History   Procedure  Date   .  Tonsillectomy    .  Cholecystectomy    .  Condyloma excision/fulguration     Family History   Problem  Relation  Age of Onset   .  Anesthesia problems  Neg Hx    .  Diabetes  Mother    .  Hypertension  Father    .  Hypertension  Sister    .  Anemia  Sister    .  Depression  Sister     History   Substance Use Topics   .  Smoking status:  Never Smoker   .  Smokeless tobacco:  Never Used   .  Alcohol Use:  No    Allergies: No Known Allergies  Prescriptions prior to admission   Medication  Sig  Dispense  Refill   .  docusate sodium (COLACE) 100 MG capsule  Take 100 mg by mouth at bedtime.     .  Prenatal Vit-Fe Fumarate-FA (PRENATAL MULTIVITAMIN) TABS  Take 1 tablet by mouth at bedtime.  30 tablet  4   .  ferrous fumarate (HEMOCYTE - 106 MG FE) 325 (106 FE) MG TABS  Take 1 tablet by mouth at bedtime.     Marland Kitchen  DISCONTD: ferrous fumarate (HEMOCYTE - 106 MG FE) 325 (106 FE) MG TABS  Take 1 tablet (106 mg of iron total) by mouth daily.  30 each  0    ROS  As above  Physical Exam   Blood pressure 120/74, pulse 75, temperature 97.6 F (36.4 C), temperature source Oral, resp. rate 20, height 5\' 8"  (1.727 m), weight 248 lb (112.492 kg), last menstrual period 03/06/2011.  Physical Exam    Constitutional: She is oriented to person, place, and time. She appears well-developed and well-nourished. No distress.  HENT:  Head: Normocephalic.  Cardiovascular: Normal rate.  Respiratory: Effort normal.  GI: Soft. She exhibits no distension and no mass. There is tenderness (slight over RUQ). There is no rebound and no guarding.  Genitourinary: Vagina normal and uterus normal. No vaginal discharge found.  Musculoskeletal: Normal range of motion. She exhibits tenderness.  Abrasion right knee Tender over left lateral malleolus, minimal swelling  Neurological: She is alert and oriented to person, place, and time.  Skin: Skin is warm and dry.  Psychiatric: She has a normal mood and affect.  FHR reassuring, not strictly reactive.  Irregular mild contractions  MAU Course   Radiology *RADIOLOGY REPORT*  Clinical Data: Fall  LEFT ANKLE COMPLETE - 3+ VIEW  Comparison: None.  Findings: No acute fracture and no dislocation. Soft tissue  swelling over the medial malleolus.  IMPRESSION:  No acute bony injury. Soft tissue swelling over the medial  malleolus is noted.  Original Report Authenticated By: Donavan Burnet, M.D.   Assessment and Plan   A: IUP at [redacted]w[redacted]d  S/P fall  Left ankle pain, without fracture. Soft tissue injury Right knee abrasion   P: Admit to antenatal for 23 hour obs Supportive treatment for ankle (RICE) Continuous EFM OB Ultrasound for placenta, BPP K-Betke pending

## 2011-11-21 NOTE — MAU Note (Signed)
Pt states she fell down around 1730. Pt  Fell on her knee and left side.

## 2011-11-21 NOTE — H&P (Signed)
Attestation of Attending Supervision of Advanced Practitioner: Evaluation and management procedures were performed by the PA/NP/CNM/OB Fellow under my supervision/collaboration. Chart reviewed, and agree with management and plan.  Jaynie Collins, M.D. 11/21/2011 8:52 PM

## 2011-11-21 NOTE — MAU Provider Note (Signed)
  History     CSN: [id number]  Arrival date and time: 11/21/11 1815   First Provider Initiated Contact with Patient 11/21/11 1838      Chief Complaint  Patient presents with  . Fall   HPI This is a 21 y.o. at [redacted]w[redacted]d who presents s/p fall today. She stepped off curve and twisted her left ankle, falling onto her right knee and hands. Did not strike belly. Does report pain in upper abdomen with tightening and low back pain. No leaking or bleeding.  OB History    Grav Para Term Preterm Abortions TAB SAB Ect Mult Living   1               Past Medical History  Diagnosis Date  . Anemia   . Abnormal Pap smear   . HPV (human papilloma virus) infection     Past Surgical History  Procedure Date  . Tonsillectomy   . Cholecystectomy   . Condyloma excision/fulguration     Family History  Problem Relation Age of Onset  . Anesthesia problems Neg Hx   . Diabetes Mother   . Hypertension Father   . Hypertension Sister   . Anemia Sister   . Depression Sister     History  Substance Use Topics  . Smoking status: Never Smoker   . Smokeless tobacco: Never Used  . Alcohol Use: No    Allergies: No Known Allergies  Prescriptions prior to admission  Medication Sig Dispense Refill  . docusate sodium (COLACE) 100 MG capsule Take 100 mg by mouth at bedtime.       . Prenatal Vit-Fe Fumarate-FA (PRENATAL MULTIVITAMIN) TABS Take 1 tablet by mouth at bedtime.  30 tablet  4  . ferrous fumarate (HEMOCYTE - 106 MG FE) 325 (106 FE) MG TABS Take 1 tablet by mouth at bedtime.      Marland Kitchen DISCONTD: ferrous fumarate (HEMOCYTE - 106 MG FE) 325 (106 FE) MG TABS Take 1 tablet (106 mg of iron total) by mouth daily.  30 each  0    ROS As above  Physical Exam   Blood pressure 120/74, pulse 75, temperature 97.6 F (36.4 C), temperature source Oral, resp. rate 20, height 5\' 8"  (1.727 m), weight 248 lb (112.492 kg), last menstrual period 03/06/2011.  Physical Exam  Constitutional: She is oriented to  person, place, and time. She appears well-developed and well-nourished. No distress.  HENT:  Head: Normocephalic.  Cardiovascular: Normal rate.   Respiratory: Effort normal.  GI: Soft. She exhibits no distension and no mass. There is tenderness (slight over RUQ). There is no rebound and no guarding.  Genitourinary: Vagina normal and uterus normal. No vaginal discharge found.  Musculoskeletal: Normal range of motion. She exhibits tenderness.       Abrasion right knee Tender over left lateral malleolus, minimal swelling  Neurological: She is alert and oriented to person, place, and time.  Skin: Skin is warm and dry.  Psychiatric: She has a normal mood and affect.  FHR reassuring, not strictly reactive. Irregular mild contractions  MAU Course  Procedures  Assessment and Plan  A:  IUP at [redacted]w[redacted]d       S/P fall      Left ankle pain      Right knee abrasion P:  Will check xray of ankle      Monitor for 4 hrs      K-Betke        [facility] 11/21/2011, 7:49 PM

## 2011-11-21 NOTE — MAU Note (Signed)
Stepped off curb and fell into street,twisted left ankle, landed on rt knee.  Caught on hands.

## 2011-11-22 ENCOUNTER — Encounter (HOSPITAL_COMMUNITY): Payer: Self-pay | Admitting: *Deleted

## 2011-11-22 NOTE — Progress Notes (Signed)
 FACULTY PRACTICE ANTEPARTUM(COMPREHENSIVE) NOTE  Kimberly Dunn is a 21 y.o. G1P0 at [redacted]w[redacted]d  who is admitted for monitoring due to contractions after she sustained a fall on her knees and arms, no abdominal trauma.   Length of Stay:  1  Days  Subjective: Patient reports good fetal movement.  She reports rare uterine contractions, no bleeding and no loss of fluid per vagina.  Vitals:  Blood pressure 113/55, pulse 56, temperature 98.5 F (36.9 C), temperature source Oral, resp. rate 20, height 5\' 8"  (1.727 m), weight 112.492 kg (248 lb), last menstrual period 03/06/2011. Physical Examination: General appearance - alert, well appearing, and in no distress Fundal Height:  size equals dates Pelvic Exam:  deferred Extremities: extremities normal, atraumatic, no cyanosis or edema and Homans sign is negative, no sign of DVT with DTRs 2+ bilaterally Membranes:intact  Fetal Monitoring:  Baseline: 120s bpm, Variability: moderate, Accelerations: Reactive and Decelerations: Absent  Labs:  Recent Results (from the past 24 hour(s))  KLEIHAUER-BETKE STAIN   Collection Time   11/21/11  8:25 PM      Component Value Range   Fetal Cells % 0.0     Quantitation Fetal Hemoglobin 0      Imaging Studies:    Final read of ultrasound pending, but preliminary ultrasound did not show abruption. Baby is cephalic, AFI 11.95,BPP 10/10 Ankle X-ray did not show a fracture, just soft swelling over the malleolus  Medications:  Scheduled    . docusate sodium  100 mg Oral QHS  . ferrous fumarate  1 tablet Oral QHS  . prenatal multivitamin  1 tablet Oral QHS  . DISCONTD: docusate sodium  100 mg Oral Daily  . DISCONTD: ferrous fumarate  1 tablet Oral Daily  . DISCONTD: prenatal multivitamin  1 tablet Oral QHS   I have reviewed the patient's current medications.  ASSESSMENT: Patient Active Problem List  Diagnoses  . Supervision of normal first pregnancy  . Anemia  . Abnormal Pap smear and cervical HPV  (human papillomavirus)  . 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 Edema or excessive weight gain, antepartum    PLAN: Continue 24 hour observation. No signs/symptoms of abruption or PTL; materno-fetal unit stable Continue routine antenatal care, plan to discharge later today if stable.   , A 11/22/2011,7:19 AM

## 2011-11-23 DIAGNOSIS — W19XXXA Unspecified fall, initial encounter: Secondary | ICD-10-CM | POA: Diagnosis present

## 2011-11-23 NOTE — Discharge Summary (Signed)
Physician Discharge Summary  Patient ID: Kimberly Dunn MRN: [id number] DOB/AGE: 10/08/1990 20 y.o.  Admit date: 11/21/2011 Discharge date: 11/23/2011   Discharge Diagnoses:  Principal Problem:  *Fall from standing   Consults: None  Significant Diagnostic Studies: labs: KB negative NST showed reassuring FHR and contractions x-ray of left ankle(s) U/S-Fetal BPP 8/8, vtx, nml fluid  Hospital Course: Admitted after a fall and found to be contracting after prolonged monitoring in MAU.  Admitted for 24 hour obs, with contractions continuing.  She had a neg. KB and reassuring FHR tracing.  She did not develop bleeding or further labor and was deemed stable for discharge.    Treatments: Observation   Disposition: 01-Home or Self Care  Discharged Condition: good  Discharge Orders    Future Appointments: Provider: Department: Dept Phone: Center:   11/27/2011 9:00 AM Aviva Signs, CNM [facility] [phone] [facility]     Future Orders Please Complete By Expires   Fetal Kick Count:  Lie on our left side for one hour after a meal, and count the number of times your baby kicks.  If it is less than 5 times, get up, move around and drink some juice.  Repeat the test 30 minutes later.  If it is still less than 5 kicks in an hour, notify your doctor.      Discharge diet:  No restrictions      Discharge instructions      Comments:   If fetal movement diminishes or you have significant vaginal bleeding, return immediately.   Discharge activity:  No Restrictions        Medication List  As of 11/23/2011 12:03 PM   TAKE these medications         docusate sodium 100 MG capsule   Commonly known as: COLACE   Take 100 mg by mouth at bedtime.      ferrous fumarate 325 (106 FE) MG Tabs   Commonly known as: HEMOCYTE - 106 mg FE   Take 1 tablet by mouth at bedtime.      prenatal multivitamin Tabs   Take 1 tablet by mouth at bedtime.           Follow-up Information    Follow up with [facility]. (Keep appointment for 11/27/2011 if symptoms worsen if symptoms worsen)    Contact information:   [phone]         Signed: Avelardo Reesman S 11/23/2011, 12:03 PM

## 2011-11-23 NOTE — Progress Notes (Signed)
Patient ID: Kimberly Dunn, female   DOB: 1991/02/18, 21 y.o.   MRN: [id number] History Doing well, no complaints.  No bleeding or worsening pain.  Reports good FM.  Physical exam Filed Vitals:   11/23/11 0757  BP: 99/54  Pulse: 69  Temp: 98.7 F (37.1 C)  Resp: 18  NST performed on 11/23/2011 was reviewed and was found to be reactive.   TOCO shows few contractions. Abdomen is soft, gravid, and non-tender.  Assessment Patient Active Problem List  Diagnoses  . Supervision of normal first pregnancy  . Anemia  . 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 Edema or excessive weight gain, antepartum  . Fall from standing   Plan D/C home after fall.  No evidence of Abruption.

## 2011-11-23 NOTE — Discharge Instructions (Signed)
Normal Labor and Delivery Your caregiver must first be sure you are in labor. Signs of labor include:  You may pass what is called "the mucus plug" before labor begins. This is a small amount of blood stained mucus.   Regular uterine contractions.   The time between contractions get closer together.   The discomfort and pain gradually gets more intense.   Pains are mostly located in the back.   Pains get worse when walking.   The cervix (the opening of the uterus becomes thinner (begins to efface) and opens up (dilates).  Once you are in labor and admitted into the hospital or care center, your caregiver will do the following:  A complete physical examination.   Check your vital signs (blood pressure, pulse, temperature and the fetal heart rate).   Do a vaginal examination (using a sterile glove and lubricant) to determine:   The position (presentation) of the baby (head [vertex] or buttock first).   The level (station) of the baby's head in the birth canal.   The effacement and dilatation of the cervix.   You may have your pubic hair shaved and be given an enema depending on your caregiver and the circumstance.   An electronic monitor is usually placed on your abdomen. The monitor follows the length and intensity of the contractions, as well as the baby's heart rate.   Usually, your caregiver will insert an IV in your arm with a bottle of sugar water. This is done as a precaution so that medications can be given to you quickly during labor or delivery.  NORMAL LABOR AND DELIVERY IS DIVIDED UP INTO 3 STAGES: First Stage This is when regular contractions begin and the cervix begins to efface and dilate. This stage can last from 3 to 15 hours. The end of the first stage is when the cervix is 100% effaced and 10 centimeters dilated. Pain medications may be given by   Injection (morphine, demerol, etc.)   Regional anesthesia (spinal, caudal or epidural, anesthetics given in  different locations of the spine). Paracervical pain medication may be given, which is an injection of and anesthetic on each side of the cervix.  A pregnant woman may request to have "Natural Childbirth" which is not to have any medications or anesthesia during her labor and delivery. Second Stage This is when the baby comes down through the birth canal (vagina) and is born. This can take 1 to 4 hours. As the baby's head comes down through the birth canal, you may feel like you are going to have a bowel movement. You will get the urge to bear down and push until the baby is delivered. As the baby's head is being delivered, the caregiver will decide if an episiotomy (a cut in the perineum and vagina area) is needed to prevent tearing of the tissue in this area. The episiotomy is sewn up after the delivery of the baby and placenta. Sometimes a mask with nitrous oxide is given for the mother to breath during the delivery of the baby to help if there is too much pain. The end of Stage 2 is when the baby is fully delivered. Then when the umbilical cord stops pulsating it is clamped and cut. Third Stage The third stage begins after the baby is completely delivered and ends after the placenta (afterbirth) is delivered. This usually takes 5 to 30 minutes. After the placenta is delivered, a medication is given either by intravenous or injection to help contract   the uterus and prevent bleeding. The third stage is not painful and pain medication is usually not necessary. If an episiotomy was done, it is repaired at this time. After the delivery, the mother is watched and monitored closely for 1 to 2 hours to make sure there is no postpartum bleeding (hemorrhage). If there is a lot of bleeding, medication is given to contract the uterus and stop the bleeding. Document Released: 06/04/2008 Document Revised: 08/15/2011 Document Reviewed: 06/04/2008 [employer] Patient Information 2012 [employer]. 

## 2011-11-23 NOTE — Progress Notes (Signed)
Per report, sprain left ankle - no swelling noted, no redness, per pt. - no pain.  0/10

## 2011-11-27 ENCOUNTER — Encounter: Payer: Self-pay | Admitting: Advanced Practice Midwife

## 2011-11-27 ENCOUNTER — Ambulatory Visit (INDEPENDENT_AMBULATORY_CARE_PROVIDER_SITE_OTHER): Payer: Medicaid Other | Admitting: Advanced Practice Midwife

## 2011-11-27 VITALS — BP 126/80 | Temp 99.1°F | Wt 236.2 lb

## 2011-11-27 DIAGNOSIS — O99340 Other mental disorders complicating pregnancy, unspecified trimester: Secondary | ICD-10-CM

## 2011-11-27 DIAGNOSIS — Z3400 Encounter for supervision of normal first pregnancy, unspecified trimester: Secondary | ICD-10-CM

## 2011-11-27 DIAGNOSIS — Z23 Encounter for immunization: Secondary | ICD-10-CM

## 2011-11-27 LAB — POCT URINALYSIS DIP (DEVICE)
Bilirubin Urine: NEGATIVE
Ketones, ur: NEGATIVE mg/dL
Specific Gravity, Urine: 1.02 (ref 1.005–1.030)
pH: 7 (ref 5.0–8.0)

## 2011-11-27 MED ORDER — TETANUS-DIPHTH-ACELL PERTUSSIS 5-2.5-18.5 LF-MCG/0.5 IM SUSP
0.5000 mL | Freq: Once | INTRAMUSCULAR | Status: AC
  Administered 2011-11-27: 0.5 mL via INTRAMUSCULAR

## 2011-11-27 NOTE — Progress Notes (Signed)
Addended by: Jill Side on: 11/27/2011 10:17 AM   Modules accepted: Orders

## 2011-11-27 NOTE — Progress Notes (Signed)
P=107, Patient reports fell last 11/21/11 and came to MAU was admitted and was discharged 11/23/11. States since then feels good, no vaginal bleeding and very little contractions or cramps. States got flu shot in January at [facility]. C/o leg cramps often. Desires TDP today.

## 2011-11-27 NOTE — Progress Notes (Signed)
Addended by: Jill Side on: 11/27/2011 10:14 AM   Modules accepted: Orders

## 2011-11-27 NOTE — Progress Notes (Signed)
Doing well. TDAP today. Reviewed labor signs.

## 2011-11-29 LAB — CULTURE, OB URINE

## 2011-11-30 ENCOUNTER — Inpatient Hospital Stay (HOSPITAL_COMMUNITY)
Admission: AD | Admit: 2011-11-30 | Discharge: 2011-11-30 | Disposition: A | Discharge status: Home / Self Care | Payer: Medicaid Other | Source: Ambulatory Visit | Attending: Obstetrics & Gynecology | Admitting: Obstetrics & Gynecology

## 2011-11-30 DIAGNOSIS — O99891 Other specified diseases and conditions complicating pregnancy: Secondary | ICD-10-CM | POA: Insufficient documentation

## 2011-11-30 NOTE — MAU Note (Signed)
Pt states, " My panties got damp at 3 Pm and since then it has gotten more wet. I started having some mentrual like cramps on the way to the hospital."

## 2011-11-30 NOTE — MAU Provider Note (Signed)
  History     CSN: [id number]  Arrival date and time: 11/30/11 2113  Chief Complaint  Patient presents with  . Rupture of Membranes   HPI  Kimberly Dunn is a 21 y.o. G1P0000 who presents at [redacted]w[redacted]d with leakage of fluid since 1700. Initially felt her underwear was soaked, then changed and has just felt damp. No bleeding or discharge. + fetal movement. [facility]. No contractions. Some discomfort when baby kicks. Some pelvic pressure when standing, but none currently. Estimated Date of Delivery: 12/11/11 based on LMP. No complications with pregnancy thus far.   Past Medical History  Diagnosis Date  . Anemia   . Abnormal Pap smear   . HPV (human papilloma virus) infection     Past Surgical History  Procedure Date  . Tonsillectomy   . Cholecystectomy   . Condyloma excision/fulguration     Family History  Problem Relation Age of Onset  . Anesthesia problems Neg Hx   . Diabetes Mother   . Hypertension Father   . Hypertension Sister   . Anemia Sister   . Depression Sister     History  Substance Use Topics  . Smoking status: Never Smoker   . Smokeless tobacco: Never Used  . Alcohol Use: No    Allergies: No Known Allergies  Prescriptions prior to admission  Medication Sig Dispense Refill  . docusate sodium (COLACE) 100 MG capsule Take 100 mg by mouth at bedtime.       . ferrous fumarate (HEMOCYTE - 106 MG FE) 325 (106 FE) MG TABS Take 1 tablet by mouth at bedtime.      . Prenatal Vit-Fe Fumarate-FA (PRENATAL MULTIVITAMIN) TABS Take 1 tablet by mouth at bedtime.  30 tablet  4    Review of Systems  Constitutional: Negative for fever.  Eyes: Negative for blurred vision, double vision and photophobia.  Respiratory: Negative for shortness of breath.   Cardiovascular: Negative for chest pain.  Gastrointestinal: Negative for nausea, vomiting and abdominal pain.  Genitourinary: Negative for hematuria.  Neurological: Negative for headaches.    Physical Exam   Blood pressure 122/71, pulse 80, temperature 97.8 F (36.6 C), temperature source Oral, resp. rate 20, height 5\' 7"  (1.702 m), weight 111.585 kg (246 lb), last menstrual period 03/06/2011.  Physical Exam  Constitutional: She is oriented to person, place, and time. She appears well-developed and well-nourished. No distress.  HENT:  Head: Normocephalic and atraumatic.  Eyes: EOM are normal.  Neck: Normal range of motion.  Cardiovascular: Normal rate.   Respiratory: Effort normal. No respiratory distress.  GI: Soft. She exhibits distension (gravid).  Genitourinary: Uterus normal. Vaginal discharge (white) found.  Musculoskeletal: Normal range of motion. She exhibits no edema.  Neurological: She is alert and oriented to person, place, and time. No cranial nerve deficit.  Skin: Skin is warm and dry. She is not diaphoretic. No erythema.  Psychiatric: She has a normal mood and affect. Her behavior is normal. Judgment and thought content normal.    MAU Course  Procedures  MDM Speculum Exam: No pooling. Negative ferning under microscope. Unable to visualize cervix. FHR: 140 baseline. Moderate variability. + accels, no decels.  No contractions  Assessment and Plan  20 y.o. G1P0000 who presents at 104w3d No ROM: Negative pooling and ferning. No contractions on monitor. Discharge home. Advised on labor precautions Follow up appt 3/27 in the [facility].  Case discussed with Sid Falcon.  Ala Dach 11/30/2011, 11:03 PM

## 2011-11-30 NOTE — Discharge Instructions (Signed)
Normal Labor and Delivery Your caregiver must first be sure you are in labor. Signs of labor include:  You may pass what is called "the mucus plug" before labor begins. This is a small amount of blood stained mucus.   Regular uterine contractions.   The time between contractions get closer together.   The discomfort and pain gradually gets more intense.   Pains are mostly located in the back.   Pains get worse when walking.   The cervix (the opening of the uterus becomes thinner (begins to efface) and opens up (dilates).  Once you are in labor and admitted into the hospital or care center, your caregiver will do the following:  A complete physical examination.   Check your vital signs (blood pressure, pulse, temperature and the fetal heart rate).   Do a vaginal examination (using a sterile glove and lubricant) to determine:   The position (presentation) of the baby (head [vertex] or buttock first).   The level (station) of the baby's head in the birth canal.   The effacement and dilatation of the cervix.   You may have your pubic hair shaved and be given an enema depending on your caregiver and the circumstance.   An electronic monitor is usually placed on your abdomen. The monitor follows the length and intensity of the contractions, as well as the baby's heart rate.   Usually, your caregiver will insert an IV in your arm with a bottle of sugar water. This is done as a precaution so that medications can be given to you quickly during labor or delivery.  NORMAL LABOR AND DELIVERY IS DIVIDED UP INTO 3 STAGES: First Stage This is when regular contractions begin and the cervix begins to efface and dilate. This stage can last from 3 to 15 hours. The end of the first stage is when the cervix is 100% effaced and 10 centimeters dilated. Pain medications may be given by   Injection (morphine, demerol, etc.)   Regional anesthesia (spinal, caudal or epidural, anesthetics given in  different locations of the spine). Paracervical pain medication may be given, which is an injection of and anesthetic on each side of the cervix.  A pregnant woman may request to have "Natural Childbirth" which is not to have any medications or anesthesia during her labor and delivery. Second Stage This is when the baby comes down through the birth canal (vagina) and is born. This can take 1 to 4 hours. As the baby's head comes down through the birth canal, you may feel like you are going to have a bowel movement. You will get the urge to bear down and push until the baby is delivered. As the baby's head is being delivered, the caregiver will decide if an episiotomy (a cut in the perineum and vagina area) is needed to prevent tearing of the tissue in this area. The episiotomy is sewn up after the delivery of the baby and placenta. Sometimes a mask with nitrous oxide is given for the mother to breath during the delivery of the baby to help if there is too much pain. The end of Stage 2 is when the baby is fully delivered. Then when the umbilical cord stops pulsating it is clamped and cut. Third Stage The third stage begins after the baby is completely delivered and ends after the placenta (afterbirth) is delivered. This usually takes 5 to 30 minutes. After the placenta is delivered, a medication is given either by intravenous or injection to help contract   the uterus and prevent bleeding. The third stage is not painful and pain medication is usually not necessary. If an episiotomy was done, it is repaired at this time. After the delivery, the mother is watched and monitored closely for 1 to 2 hours to make sure there is no postpartum bleeding (hemorrhage). If there is a lot of bleeding, medication is given to contract the uterus and stop the bleeding. Document Released: 06/04/2008 Document Revised: 08/15/2011 Document Reviewed: 06/04/2008 [employer] Patient Information 2012 [employer]. 

## 2011-12-04 ENCOUNTER — Ambulatory Visit (INDEPENDENT_AMBULATORY_CARE_PROVIDER_SITE_OTHER): Payer: Medicaid Other | Admitting: Advanced Practice Midwife

## 2011-12-04 ENCOUNTER — Encounter: Payer: Self-pay | Admitting: Advanced Practice Midwife

## 2011-12-04 DIAGNOSIS — O99340 Other mental disorders complicating pregnancy, unspecified trimester: Secondary | ICD-10-CM

## 2011-12-04 LAB — POCT URINALYSIS DIP (DEVICE)
Glucose, UA: NEGATIVE mg/dL
Protein, ur: 30 mg/dL — AB
Urobilinogen, UA: 1 mg/dL (ref 0.0–1.0)

## 2011-12-04 NOTE — Progress Notes (Signed)
Pulse- 89.  Pressure- pelvic, "feels like spasms on left side"

## 2011-12-04 NOTE — Patient Instructions (Signed)
Embarazo - Tercer trimestre (Pregnancy - Third Trimester) El tercer trimestre del embarazo (los ltimos 3 meses) es el perodo de cambios ms rpidos que atraviesan usted y el beb. El aumento de peso es ms rpido. El beb alcanza un largo de aproximadamente 50 cm (20 pulgadas) y pesa entre 2,700 y 4,500 kg (6 a 10 libras). El beb gana ms tejido graso y ya est listo para la vida fuera del cuerpo de la madre. Mientras estn en el interior, los bebs tienen perodos de sueo y vigilia, succionan el pulgar y tienen hipo. Quizs sienta pequeas contracciones del tero. Este es el falso trabajo de parto. Tambin se las conoce como contracciones de Braxton-Hicks. Es como una prctica del parto. Los problemas ms habituales de esta etapa del embarazo incluyen mayor dificultad para respirar, hinchazn de las manos y los pies por retencin de lquidos y la necesidad de orinar con ms frecuencia debido a que el tero y el beb presionan sobre la vejiga.  EXAMENES PRENATALES  Durante los exmenes prenatales, deber seguir realizando pruebas de sangre, segn avance el embarazo. Estas pruebas se realizan para controlar su salud y la del beb. Tambin se realizan anlisis de sangre para conocer los niveles de hemoglobina. La anemia (bajo nivel de hemoglobina) es frecuente durante el embarazo. Para prevenirla, se administran hierro y vitaminas. Tambin le harn nuevas pruebas para descartar la diabetes. Podrn repetirle algunas de las pruebas que le hicieron previamente.   En cada visita le medirn el tamao del tero. Es para asegurarse de que el beb se desarrolla correctamente.   Tambin en cada visita la pesarn. Esto se realiza para asegurarse de que aumenta de peso al ritmo indicado y que usted y su beb evolucionan normalmente.   En algunas ocasiones se realiza una ecografa para confirmar el correcto desarrollo y evolucin del beb. Esta prueba se realiza con ondas sonoras inofensivas para el beb, de modo  que el profesional pueda calcular con ms precisin la fecha del parto.   Discuta las posibilidades de la anestesia si necesita cesrea.  Algunas veces se realizan pruebas especializadas del lquido amnitico que rodea al beb. Esta prueba se denomina amniocentesis. El lquido amnitico se obtiene introduciendo una aguja en el abdomen (vientre). En ocasiones se lleva a cabo cerca del final del embarazo, si es necesario adelantar el parto. En este caso se realiza para asegurarse de que los pulmones del beb estn lo suficientemente maduros como para que pueda vivir fuera del tero. CAMBIOS QUE OCURREN EN EL TERCER TRIMESTRE DEL EMBARAZO Su organismo atravesar diferentes cambios durante el embarazo que varan de una persona a otra. Converse con el profesional que la asiste acerca los cambios que usted note y que la preocupen.  Durante el ltimo trimestre probablemente sienta un aumento del apetito. Es normal tener "antojos" de ciertas comidas. Esto vara de una persona a otra y de un embarazo a otro.   Podrn aparecer las primeras estras en las caderas, abdomen y mamas. Estos son cambios normales del cuerpo durante el embarazo. No existen medicamentos ni ejercicios que puedan prevenir estos cambios.   El estreimiento puede tratarse con un laxante o agregando fibra a su dieta. Beber grandes cantidades de lquidos, tomar fibras en forma de verduras, frutas y granos integrales es de gran ayuda.   Tambin es beneficioso practicar actividad fsica. Si ha sido una persona activa hasta el embarazo, podr continuar con la mayora de las actividades durante el mismo. Si ha sido menos activa, puede ser beneficioso   que comience con un programa de ejercicios, como realizar caminatas. Consulte con el profesional que la asiste antes de comenzar un programa de ejercicios.   Evite el consumo de cigarrillos, el alcohol, los medicamentos no prescritos y las "drogas de la calle" durante el embarazo. Estas sustancias  qumicas afectan la formacin y el desarrollo del beb. Evite estas sustancias durante todo el embarazo para asegurar el nacimiento de un beb sano.   Dolor de espalda, venas varicosas y hemorroides podran aparecer o empeorar.   Los movimientos del beb pueden ser ms bruscos y aparecer ms a menudo.   Puede que note dificultades para respirar facilmente.   El ombligo podra salrsele hacia afuera.   Puede segregar un lquido amarillento (calostro) de las mamas.   Puede segregar mucus con sangre. Esto normalmente ocurre unos pocos das a una semana antes de que comience el trabajo de parto.  INSTRUCCIONES PARA EL CUIDADO DOMICILIARIO  La mayor parte de los cuidados que se aconsejan son los mismos que los indicados para las primeras etapas del embarazo. Es importante que concurra a todas las citas con el profesional y siga sus instrucciones con respecto a los medicamentos que deba utilizar, a la actividad fsica y a la dieta.   Durante el embarazo debe obtener nutrientes para usted y para su beb. Consuma alimentos balanceados a intervalos regulares. Elija alimentos como carne, pescado, leche y otros productos lcteos descremados, verduras, frutas, panes integrales y cereales. El profesional le informar cul es el aumento de peso ideal.   Las relaciones sexuales pueden continuarse hasta casi el final del embarazo, si no se presentan otros problemas como prdida prematura (antes de tiempo) de lquido amnitico, hemorragia vaginal o dolor abdominal (en el vientre).   Realice actividad fsica todos los das, si no tiene restricciones. Consulte con el profesional que la asiste si no sabe con certeza si determinados ejercicios son seguros. El mayor aumento de peso se produce en los dos ltimos trimestres del embarazo.   Haga reposo con frecuencia, con las piernas elevadas, o segn lo necesite para evitar los calambres y el dolor de cintura.   Use un buen sostn o como los que se usan para hacer  deportes para aliviar la sensibilidad de las mamas. Tambin puede serle til si lo usa mientras duerme. Si pierde calostro, podr utilizar apsitos en el sostn.   No utilice la baera con agua caliente, baos turcos y saunas.   Colquese el cinturn de seguridad cuando conduzca. Este la proteger a usted y al beb en caso de accidente.   Evite comer carne cruda y el contacto con los utensilios y desperdicios de los gatos. Estos elementos contienen grmenes que pueden causar defectos de nacimiento en el beb.   Es fcil perder algo de orina durante el embarazo. Apretar y fortalecer los msculos de la pelvis la ayudar con este problema. Practique detener la miccin cuando est en el bao. Estos son los mismos msculos que necesita fortalecer. Son tambin los mismos msculos que utiliza cuando trata de evitar los gases. Puede practicar apretando estos msculos diez veces, y repetir esto tres veces por da aproximadamente. Una vez que conozca qu msculos debe contraer, no realice estos ejercicios durante la miccin. Puede favorecerle una infeccin si la orina vuelve hacia atrs.   Pida ayuda si tiene necesidades econmicas, de asesoramiento o nutricionales durante el embarazo. El profesional podr ayudarla con respecto a estas necesidades, o derivarla a otros especialistas.   Practique la ida hasta el hospital a modo   de prueba.   Tome clases prenatales junto con su pareja para comprender, practicar y hacer preguntas acerca del trabajo de parto y el nacimiento.   Prepare la habitacin del beb.   No viaje fuera de la ciudad a menos que sea absolutamente necesario y con el consejo del mdico.   Use slo zapatos bajos sin taco para tener un mejor equilibrio y prevenir cadas.  EL CONSUMO DE MEDICAMENTOS Y DROGAS DURANTE EL EMBARAZO  Contine tomando las vitaminas apropiadas para esta etapa tal como se le indic. Las vitaminas deben contener un miligramo de cido flico y deben suplementarse con  hierro. Guarde todas las vitaminas fuera del alcance de los nios. La ingestin de slo un par de vitaminas o comprimidos que contengan hierro pueden ocasionar la muerte en un beb o en un nio pequeo.   Evite el uso de medicamentos, inclusive los de venta libre, que no hayan sido prescritos o indicados por el profesional que la asiste. Algunos medicamentos pueden causar problemas fsicos al beb. Utilice los medicamentos de venta libre o de prescripcin para el dolor, el malestar o la fiebre, segn se lo indique el profesional que lo asiste. No utilice aspirina, ibuprofeno (Motrin, Advil, Nuprin) o naproxeno (Aleve) a menos que el profesional la autorice.   El alcohol se asocia a cierto nmero de defectos del nacimiento, incluido el sndrome de alcoholismo fetal. Debe evitar el consumo de alcohol en cualquiera de sus formas. El cigarrillo causa nacimientos prematuros y bebs de bajo peso al nacer. Las drogas de la calle son muy nocivas para el beb y estn absolutamente prohibidas. Un beb que nace de una madre adicta, ser adicto al nacer. Ese beb tendr los mismos sntomas de abstinencia que un adulto.   Infrmele al profesional si consume alguna droga.  SOLICITE ATENCIN MDICA SI: Tiene alguna preocupacin durante el embarazo. Es mejor que llame para formular las preguntas si no puede esperar hasta la prxima visita, que sentirse preocupada por ellas.  DECISIONES ACERCA DE LA CIRCUNCISIN Usted puede saber o no cul es el sexo de su beb. Si es un varn, ste es el momento de pensar acerca de la circuncisin. La circuncisin es la extirpacin del prepucio. Esta es la piel que cubre el extremo sensible del pene. No hay un motivo mdico que lo justifique. Generalmente la decisin se toma segn lo que sea popular en ese momento, o se basa en creencias religiosas. Podr conversar estos temas con el profesional que la asiste. SOLICITE ATENCIN MDICA DE INMEDIATO SI:  La temperatura oral se eleva  sin motivo por encima de 102 F (38.9 C) o segn le indique el profesional que la asiste.   Tiene una prdida de lquido por la vagina (canal de parto). Si sospecha una ruptura de las membranas, tmese la temperatura y llame al profesional para informarlo sobre esto.   Observa unas pequeas manchas, una hemorragia vaginal o elimina cogulos. Avsele al profesional acerca de la cantidad y de cuntos apsitos est utilizando.   Presenta un olor desagradable en la secrecin vaginal y observa un cambio en el color, de transparente a blanco.   Ha vomitado durante ms de 24 horas.   Presenta escalofros o fiebre.   Comienza a sentir falta de aire.   Siente ardor al orinar.   Baja o sube ms de 900 g (ms de 2 libras), o segn lo indicado por el profesional que la asiste. Observa que sbitamente se le hinchan el rostro, las manos, los pies o las   piernas.   Presenta dolor abdominal. Las molestias en el ligamento redondo son una causa benigna (no cancerosa) frecuente de dolor abdominal durante el embarazo, pero el profesional que la asiste deber evaluarlo.   Presenta dolor de cabeza intenso que no se alivia.   Si no siente los movimientos del beb durante ms de tres horas. Si piensa que el beb no se mueve tanto como lo haca habitualmente, coma algo que contenga azcar y recustese sobre el lado izquierdo durante una hora. El beb debe moverse al menos 4  5 veces por hora. Comunquese inmediatamente si el beb se mueve menos que lo indicado.   Se cae, se ve involucrada en un accidente automovilstico o sufre algn tipo de traumatismo.   En su hogar hay violencia mental o fsica.  Document Released: 06/05/2005 Document Revised: 08/15/2011 [employer] Patient Information 2012 [employer]. 

## 2011-12-04 NOTE — Progress Notes (Signed)
Well, no c/o, rev'd precautions/labor signs. Planning on breastfeeding.

## 2011-12-11 ENCOUNTER — Ambulatory Visit (INDEPENDENT_AMBULATORY_CARE_PROVIDER_SITE_OTHER): Payer: Medicaid Other | Admitting: Advanced Practice Midwife

## 2011-12-11 VITALS — BP 130/84 | Temp 98.2°F | Wt 239.5 lb

## 2011-12-11 DIAGNOSIS — B977 Papillomavirus as the cause of diseases classified elsewhere: Secondary | ICD-10-CM

## 2011-12-11 DIAGNOSIS — D649 Anemia, unspecified: Secondary | ICD-10-CM

## 2011-12-11 DIAGNOSIS — R8789 Other abnormal findings in specimens from female genital organs: Secondary | ICD-10-CM

## 2011-12-11 DIAGNOSIS — O99340 Other mental disorders complicating pregnancy, unspecified trimester: Secondary | ICD-10-CM

## 2011-12-11 DIAGNOSIS — O99019 Anemia complicating pregnancy, unspecified trimester: Secondary | ICD-10-CM

## 2011-12-11 DIAGNOSIS — IMO0002 Reserved for concepts with insufficient information to code with codable children: Secondary | ICD-10-CM

## 2011-12-11 LAB — POCT URINALYSIS DIP (DEVICE)
Glucose, UA: NEGATIVE mg/dL
Nitrite: NEGATIVE
Specific Gravity, Urine: 1.02 (ref 1.005–1.030)
Urobilinogen, UA: 1 mg/dL (ref 0.0–1.0)

## 2011-12-11 NOTE — Patient Instructions (Signed)
It was nice to meet you Please make an appointment in one week We will arrange for you to have twice weekly testing

## 2011-12-11 NOTE — Progress Notes (Signed)
Pulse- 87  Edema- legs  Pain/pressure- "cramps, lower abd" Pt requests to be checked for dilation

## 2011-12-11 NOTE — Progress Notes (Signed)
Doing well, no complaints.  Infrequent contractions.  Good fetal movement.  Labor precautions reviewed.  Will arrange for twice weekly NST, return in 1 week if not delivered.

## 2011-12-11 NOTE — Progress Notes (Signed)
Pt seen and agree.

## 2011-12-14 NOTE — MAU Provider Note (Signed)
I examined pt and agree with documentation above and resident plan of care. MUHAMMAD,Burlene Montecalvo  

## 2011-12-16 ENCOUNTER — Telehealth: Payer: Self-pay | Admitting: *Deleted

## 2011-12-16 DIAGNOSIS — D649 Anemia, unspecified: Secondary | ICD-10-CM

## 2011-12-16 DIAGNOSIS — IMO0002 Reserved for concepts with insufficient information to code with codable children: Secondary | ICD-10-CM

## 2011-12-16 DIAGNOSIS — O99340 Other mental disorders complicating pregnancy, unspecified trimester: Secondary | ICD-10-CM

## 2011-12-16 NOTE — Telephone Encounter (Signed)
Pt left a message inquiring if she can have her membranes swept since she is 40 weeks.

## 2011-12-16 NOTE — Telephone Encounter (Signed)
Returned patients call female that answered states that Kimberly Dunn in unavailable. I told her to let patient know that we returned her call.

## 2011-12-17 ENCOUNTER — Inpatient Hospital Stay (HOSPITAL_COMMUNITY)
Admission: AD | Admit: 2011-12-17 | Discharge: 2011-12-17 | Disposition: A | Discharge status: Home / Self Care | Payer: Medicaid Other | Attending: Obstetrics and Gynecology | Admitting: Obstetrics and Gynecology

## 2011-12-17 ENCOUNTER — Encounter (HOSPITAL_COMMUNITY): Payer: Self-pay | Admitting: *Deleted

## 2011-12-17 ENCOUNTER — Inpatient Hospital Stay (HOSPITAL_COMMUNITY)
Admission: AD | Admit: 2011-12-17 | Discharge: 2011-12-19 | DRG: 774 | Disposition: A | Discharge status: Home / Self Care | Payer: Medicaid Other | Source: Ambulatory Visit | Attending: Obstetrics & Gynecology | Admitting: Obstetrics & Gynecology

## 2011-12-17 DIAGNOSIS — O479 False labor, unspecified: Secondary | ICD-10-CM | POA: Insufficient documentation

## 2011-12-17 DIAGNOSIS — Z3400 Encounter for supervision of normal first pregnancy, unspecified trimester: Secondary | ICD-10-CM

## 2011-12-17 DIAGNOSIS — IMO0002 Reserved for concepts with insufficient information to code with codable children: Secondary | ICD-10-CM

## 2011-12-17 DIAGNOSIS — D649 Anemia, unspecified: Secondary | ICD-10-CM

## 2011-12-17 DIAGNOSIS — IMO0001 Reserved for inherently not codable concepts without codable children: Secondary | ICD-10-CM

## 2011-12-17 DIAGNOSIS — O99340 Other mental disorders complicating pregnancy, unspecified trimester: Secondary | ICD-10-CM

## 2011-12-17 LAB — CBC
HCT: 37.5 % (ref 36.0–46.0)
Hemoglobin: 12.9 g/dL (ref 12.0–15.0)
MCH: 29.9 pg (ref 26.0–34.0)
MCHC: 34.4 g/dL (ref 30.0–36.0)
MCV: 86.8 fL (ref 78.0–100.0)
Platelets: 266 10*3/uL (ref 150–400)
RBC: 4.32 MIL/uL (ref 3.87–5.11)
RDW: 14.1 % (ref 11.5–15.5)
WBC: 13.2 10*3/uL — ABNORMAL HIGH (ref 4.0–10.5)

## 2011-12-17 MED ORDER — OXYCODONE-ACETAMINOPHEN 5-325 MG PO TABS
1.0000 | ORAL_TABLET | ORAL | Status: DC | PRN
  Administered 2011-12-18 – 2011-12-19 (×3): 1 via ORAL
  Filled 2011-12-17 (×3): qty 1

## 2011-12-17 MED ORDER — BENZOCAINE-MENTHOL 20-0.5 % EX AERO
1.0000 "application " | INHALATION_SPRAY | CUTANEOUS | Status: DC | PRN
  Administered 2011-12-18: 1 via TOPICAL

## 2011-12-17 MED ORDER — ONDANSETRON HCL 4 MG/2ML IJ SOLN: 4.0000 mg | INTRAMUSCULAR | Status: DC | PRN

## 2011-12-17 MED ORDER — LANOLIN HYDROUS EX OINT: TOPICAL_OINTMENT | CUTANEOUS | Status: DC | PRN

## 2011-12-17 MED ORDER — OXYTOCIN 20 UNITS IN LACTATED RINGERS INFUSION - SIMPLE
INTRAVENOUS | Status: AC
  Filled 2011-12-17: qty 1000

## 2011-12-17 MED ORDER — CITRIC ACID-SODIUM CITRATE 334-500 MG/5ML PO SOLN: 30.0000 mL | ORAL | Status: DC | PRN

## 2011-12-17 MED ORDER — FLEET ENEMA 7-19 GM/118ML RE ENEM: 1.0000 | ENEMA | RECTAL | Status: DC | PRN

## 2011-12-17 MED ORDER — SODIUM CHLORIDE 0.9 % IJ SOLN: 3.0000 mL | INTRAMUSCULAR | Status: DC | PRN

## 2011-12-17 MED ORDER — OXYTOCIN 20 UNITS IN LACTATED RINGERS INFUSION - SIMPLE: 125.0000 mL/h | Freq: Once | INTRAVENOUS | Status: DC

## 2011-12-17 MED ORDER — SODIUM CHLORIDE 0.9 % IV SOLN: 250.0000 mL | INTRAVENOUS | Status: DC | PRN

## 2011-12-17 MED ORDER — SODIUM CHLORIDE 0.9 % IJ SOLN: 3.0000 mL | Freq: Two times a day (BID) | INTRAMUSCULAR | Status: DC

## 2011-12-17 MED ORDER — OXYCODONE-ACETAMINOPHEN 5-325 MG PO TABS
1.0000 | ORAL_TABLET | ORAL | Status: DC | PRN
  Administered 2011-12-17: 1 via ORAL
  Filled 2011-12-17: qty 1

## 2011-12-17 MED ORDER — IBUPROFEN 600 MG PO TABS
600.0000 mg | ORAL_TABLET | Freq: Four times a day (QID) | ORAL | Status: DC | PRN
  Administered 2011-12-17: 600 mg via ORAL
  Filled 2011-12-17: qty 1

## 2011-12-17 MED ORDER — DIBUCAINE 1 % RE OINT: 1.0000 "application " | TOPICAL_OINTMENT | RECTAL | Status: DC | PRN

## 2011-12-17 MED ORDER — OXYTOCIN BOLUS FROM INFUSION
500.0000 mL | Freq: Once | INTRAVENOUS | Status: DC
  Filled 2011-12-17: qty 500

## 2011-12-17 MED ORDER — ONDANSETRON HCL 4 MG/2ML IJ SOLN: 4.0000 mg | Freq: Four times a day (QID) | INTRAMUSCULAR | Status: DC | PRN

## 2011-12-17 MED ORDER — ZOLPIDEM TARTRATE 5 MG PO TABS: 5.0000 mg | ORAL_TABLET | Freq: Every evening | ORAL | Status: DC | PRN

## 2011-12-17 MED ORDER — PRENATAL MULTIVITAMIN CH
1.0000 | ORAL_TABLET | Freq: Every day | ORAL | Status: DC
  Administered 2011-12-18 – 2011-12-19 (×2): 1 via ORAL
  Filled 2011-12-17 (×2): qty 1

## 2011-12-17 MED ORDER — LIDOCAINE HCL (PF) 1 % IJ SOLN
30.0000 mL | INTRAMUSCULAR | Status: DC | PRN
  Administered 2011-12-17: 30 mL via SUBCUTANEOUS
  Filled 2011-12-17: qty 30

## 2011-12-17 MED ORDER — LACTATED RINGERS IV SOLN: 500.0000 mL | INTRAVENOUS | Status: DC | PRN

## 2011-12-17 MED ORDER — ONDANSETRON HCL 4 MG PO TABS: 4.0000 mg | ORAL_TABLET | ORAL | Status: DC | PRN

## 2011-12-17 MED ORDER — WITCH HAZEL-GLYCERIN EX PADS: 1.0000 "application " | MEDICATED_PAD | CUTANEOUS | Status: DC | PRN

## 2011-12-17 MED ORDER — FENTANYL CITRATE 0.05 MG/ML IJ SOLN: 100.0000 ug | INTRAMUSCULAR | Status: DC | PRN

## 2011-12-17 MED ORDER — DIPHENHYDRAMINE HCL 25 MG PO CAPS: 25.0000 mg | ORAL_CAPSULE | Freq: Four times a day (QID) | ORAL | Status: DC | PRN

## 2011-12-17 MED ORDER — TETANUS-DIPHTH-ACELL PERTUSSIS 5-2.5-18.5 LF-MCG/0.5 IM SUSP: 0.5000 mL | Freq: Once | INTRAMUSCULAR | Status: DC

## 2011-12-17 MED ORDER — OXYTOCIN 20 UNITS IN LACTATED RINGERS INFUSION - SIMPLE: 125.0000 mL/h | INTRAVENOUS | Status: DC

## 2011-12-17 MED ORDER — SENNOSIDES-DOCUSATE SODIUM 8.6-50 MG PO TABS
2.0000 | ORAL_TABLET | Freq: Every day | ORAL | Status: DC
  Administered 2011-12-18: 2 via ORAL

## 2011-12-17 MED ORDER — ACETAMINOPHEN 325 MG PO TABS: 650.0000 mg | ORAL_TABLET | ORAL | Status: DC | PRN

## 2011-12-17 MED ORDER — MISOPROSTOL 200 MCG PO TABS
1000.0000 ug | ORAL_TABLET | Freq: Once | ORAL | Status: AC
  Administered 2011-12-17: 1000 ug via RECTAL
  Filled 2011-12-17: qty 5

## 2011-12-17 MED ORDER — SIMETHICONE 80 MG PO CHEW: 80.0000 mg | CHEWABLE_TABLET | ORAL | Status: DC | PRN

## 2011-12-17 MED ORDER — IBUPROFEN 600 MG PO TABS
600.0000 mg | ORAL_TABLET | Freq: Four times a day (QID) | ORAL | Status: DC
  Administered 2011-12-18 – 2011-12-19 (×7): 600 mg via ORAL
  Filled 2011-12-17 (×7): qty 1

## 2011-12-17 NOTE — MAU Note (Signed)
Dr. Konrad Dolores notified patient c/o of contractions. SVE 1/70/-2. Ctx every 6 mins on the monitor. Reactive fetal tracing. Orders to discharge patient home with labor precautions

## 2011-12-17 NOTE — Progress Notes (Signed)
Patient ID: Kimberly Dunn, female   DOB: 23-Jan-1991, 21 y.o.   MRN: [id number] In to see pt due to earlier RN report of pt dizzy when amb to BR along with golf ball-sized clots x 2. Orders had been given for IV Pit (had no Pit after birth due to pt request, and was saline locked) and Cytotec . Pt currently without c/o dizziness. Feels well. VSS FF with scant lochia Reassured pt CBC in AM Alayne Estrella, [facility] 12/17/2011 11:45 PM

## 2011-12-17 NOTE — MAU Note (Signed)
Pt reports contractions x 5 hours, denies bleeding or ROM

## 2011-12-17 NOTE — Progress Notes (Signed)
Patient up to bathroom to void at 2030. Patient states "I feel dizzy and faint like" Used steady to get patient back to bed safely. Vitals signs WNL and patient States "She feel better". Fundus checked and boggy but firm up with massage. Patient bleeding moderate amount with many golf ball size clots passed. Patient called back out at 2100 and states she feel "Dizzy" again. Vitals Signs WNL. CNM on call notified and orders received and initiated.

## 2011-12-17 NOTE — Discharge Instructions (Signed)
Normal Labor and Delivery Your caregiver must first be sure you are in labor. Signs of labor include:  You may pass what is called "the mucus plug" before labor begins. This is a small amount of blood stained mucus.   Regular uterine contractions.   The time between contractions get closer together.   The discomfort and pain gradually gets more intense.   Pains are mostly located in the back.   Pains get worse when walking.   The cervix (the opening of the uterus becomes thinner (begins to efface) and opens up (dilates).  Once you are in labor and admitted into the hospital or care center, your caregiver will do the following:  A complete physical examination.   Check your vital signs (blood pressure, pulse, temperature and the fetal heart rate).   Do a vaginal examination (using a sterile glove and lubricant) to determine:   The position (presentation) of the baby (head [vertex] or buttock first).   The level (station) of the baby's head in the birth canal.   The effacement and dilatation of the cervix.   You may have your pubic hair shaved and be given an enema depending on your caregiver and the circumstance.   An electronic monitor is usually placed on your abdomen. The monitor follows the length and intensity of the contractions, as well as the baby's heart rate.   Usually, your caregiver will insert an IV in your arm with a bottle of sugar water. This is done as a precaution so that medications can be given to you quickly during labor or delivery.  NORMAL LABOR AND DELIVERY IS DIVIDED UP INTO 3 STAGES: First Stage This is when regular contractions begin and the cervix begins to efface and dilate. This stage can last from 3 to 15 hours. The end of the first stage is when the cervix is 100% effaced and 10 centimeters dilated. Pain medications may be given by   Injection (morphine, demerol, etc.)   Regional anesthesia (spinal, caudal or epidural, anesthetics given in  different locations of the spine). Paracervical pain medication may be given, which is an injection of and anesthetic on each side of the cervix.  A pregnant woman may request to have "Natural Childbirth" which is not to have any medications or anesthesia during her labor and delivery. Second Stage This is when the baby comes down through the birth canal (vagina) and is born. This can take 1 to 4 hours. As the baby's head comes down through the birth canal, you may feel like you are going to have a bowel movement. You will get the urge to bear down and push until the baby is delivered. As the baby's head is being delivered, the caregiver will decide if an episiotomy (a cut in the perineum and vagina area) is needed to prevent tearing of the tissue in this area. The episiotomy is sewn up after the delivery of the baby and placenta. Sometimes a mask with nitrous oxide is given for the mother to breath during the delivery of the baby to help if there is too much pain. The end of Stage 2 is when the baby is fully delivered. Then when the umbilical cord stops pulsating it is clamped and cut. Third Stage The third stage begins after the baby is completely delivered and ends after the placenta (afterbirth) is delivered. This usually takes 5 to 30 minutes. After the placenta is delivered, a medication is given either by intravenous or injection to help contract   the uterus and prevent bleeding. The third stage is not painful and pain medication is usually not necessary. If an episiotomy was done, it is repaired at this time. After the delivery, the mother is watched and monitored closely for 1 to 2 hours to make sure there is no postpartum bleeding (hemorrhage). If there is a lot of bleeding, medication is given to contract the uterus and stop the bleeding. Document Released: 06/04/2008 Document Revised: 08/15/2011 Document Reviewed: 06/04/2008 [employer] Patient Information 2012 [employer]. 

## 2011-12-17 NOTE — Telephone Encounter (Signed)
Pt has clinic appt 12/18/11 for Ob follow up.  Her request can be addressed @ that time.

## 2011-12-17 NOTE — H&P (Signed)
Chief Complaint:  Active Labor   First Provider Initiated Contact with Patient 12/17/11 1646      Kimberly Dunn is  21 y.o. G1P0000.  Patient's last menstrual period was 03/06/2011..  [redacted]w[redacted]d  She presents complaining of Contractions . Onset is described as ongoing and has been present since midnight last night when she lost her mucous plug. Denies bleeding or LOF. + FM  Obstetrical/Gynecological History: OB History    Grav Para Term Preterm Abortions TAB SAB Ect Mult Living   1 0 0 0 0 0 0 0 0 0       Past Medical History: Past Medical History  Diagnosis Date  . Anemia   . Abnormal Pap smear   . HPV (human papilloma virus) infection     Past Surgical History: Past Surgical History  Procedure Date  . Tonsillectomy   . Cholecystectomy   . Condyloma excision/fulguration     Family History: Family History  Problem Relation Age of Onset  . Anesthesia problems Neg Hx   . Diabetes Mother   . Hypertension Father   . Hypertension Sister   . Anemia Sister   . Depression Sister     Social History: History  Substance Use Topics  . Smoking status: Never Smoker   . Smokeless tobacco: Never Used  . Alcohol Use: No    Allergies: No Known Allergies  Prescriptions prior to admission  Medication Sig Dispense Refill  . diphenhydrAMINE (BENADRYL) 25 MG tablet Take 25 mg by mouth every 6 (six) hours as needed. For allergies      . docusate sodium (COLACE) 100 MG capsule Take 100 mg by mouth daily as needed. For constipation      . ferrous fumarate (HEMOCYTE - 106 MG FE) 325 (106 FE) MG TABS Take 1 tablet by mouth at bedtime.      Marland Kitchen guaiFENesin (MUCINEX) 600 MG 12 hr tablet Take 600 mg by mouth 2 (two) times daily as needed. For congestion      . loratadine (CLARITIN) 10 MG tablet Take 10 mg by mouth daily as needed. For allergies      . Prenatal Vit-Fe Fumarate-FA (PRENATAL MULTIVITAMIN) TABS Take 1 tablet by mouth at bedtime.  30 tablet  4    Review of Systems - Negative  except what has been reviewed in the HPI  Physical Exam   Blood pressure 96/71, pulse 79, temperature 98.6 F (37 C), temperature source Oral, resp. rate 18, last menstrual period 03/06/2011, SpO2 99.00%.  General: General appearance - alert, well appearing, and in no distress, oriented to person, place, and time and overweight Mental status - alert, oriented to person, place, and time, normal mood, behavior, speech, dress, motor activity, and thought processes, affect appropriate to mood Eyes - left eye normal, right eye normal Chest - clear to auscultation, no wheezes, rales or rhonchi, symmetric air entry Heart - normal rate, regular rhythm, normal S1, S2, no murmurs, rubs, clicks or gallops Abdomen - gravid, non tender, contracting Neurological - alert, oriented, normal speech, no focal findings or movement disorder noted, screening mental status exam normal, DTR's normal and symmetric Extremities - peripheral pulses normal, no pedal edema, no clubbing or cyanosis Focused Gynecological Exam: 5/90/vtx/-1/BBOW  Labs: A pos, antibody neg, HIV NR, HepB neg, GC/Chl neg/neg, GBS neg, 1 hour: 80. Quad: declined   Assessment: Active Labor - GBS neg Fetal testing c/w [facility]  Plan: Active Labor at [redacted]w[redacted]d Expectant management Desires un-medicated birth, has doula  Clemencia Helzer E. 12/17/2011,4:48 PM

## 2011-12-17 NOTE — MAU Note (Signed)
Pt states she was seen in MAU abour midnight and was 1cm and has been having pain every since.Pt states pain become worse at about 1500

## 2011-12-18 ENCOUNTER — Encounter: Payer: Medicaid Other | Admitting: Physician Assistant

## 2011-12-18 LAB — CBC
HCT: 26.1 % — ABNORMAL LOW (ref 36.0–46.0)
Hemoglobin: 8.9 g/dL — ABNORMAL LOW (ref 12.0–15.0)
MCV: 87.6 fL (ref 78.0–100.0)
Platelets: 233 10*3/uL (ref 150–400)
RBC: 2.98 MIL/uL — ABNORMAL LOW (ref 3.87–5.11)
WBC: 11.6 10*3/uL — ABNORMAL HIGH (ref 4.0–10.5)

## 2011-12-18 MED ORDER — METHYLERGONOVINE MALEATE 0.2 MG PO TABS
0.2000 mg | ORAL_TABLET | Freq: Four times a day (QID) | ORAL | Status: DC
  Administered 2011-12-18 – 2011-12-19 (×4): 0.2 mg via ORAL
  Filled 2011-12-18 (×4): qty 1

## 2011-12-18 MED ORDER — BENZOCAINE-MENTHOL 20-0.5 % EX AERO
INHALATION_SPRAY | CUTANEOUS | Status: AC
  Administered 2011-12-18: 1 via TOPICAL
  Filled 2011-12-18: qty 56

## 2011-12-18 NOTE — Progress Notes (Addendum)
Post Partum Day 1 Subjective: voiding, tolerating PO, + flatus and Dizziness w/ ambulation.   Objective: Blood pressure 100/67, pulse 77, temperature 98.4 F (36.9 C), temperature source Oral, resp. rate 18, height 5\' 8"  (1.727 m), weight 111.131 kg (245 lb), last menstrual period 03/06/2011, SpO2 97.00%, unknown if currently breastfeeding.  Physical Exam:  General: alert, cooperative, appears stated age and no distress Lochia: appropriate Uterine Fundus: firm DVT Evaluation: No evidence of DVT seen on physical exam. 1+ LE edema   Basename 12/18/11 0605 12/17/11 1730  HGB 8.9* 12.9  HCT 26.1* 37.5    Assessment/Plan: Discharge home pending improvement in orthostatic symptoms, Breastfeeding and Contraception nexplanon.   LOS: 1 day   [name], [name] 12/18/2011, 8:09 AM    I have seen and examined this patient and I agree with the above. Plan d/c home tomorrow. Cam Hai 8:47 AM 12/18/2011

## 2011-12-18 NOTE — Progress Notes (Signed)
UR chart review completed.  

## 2011-12-19 LAB — CBC
Hemoglobin: 8.5 g/dL — ABNORMAL LOW (ref 12.0–15.0)
MCH: 29.1 pg (ref 26.0–34.0)
MCV: 89.4 fL (ref 78.0–100.0)
Platelets: 196 10*3/uL (ref 150–400)
RDW: 14.7 % (ref 11.5–15.5)

## 2011-12-19 MED ORDER — IBUPROFEN 600 MG PO TABS: 600.0000 mg | ORAL_TABLET | Freq: Four times a day (QID) | ORAL | Status: AC

## 2011-12-19 MED ORDER — BENZOCAINE-MENTHOL 20-0.5 % EX AERO
INHALATION_SPRAY | CUTANEOUS | Status: AC
  Filled 2011-12-19: qty 56

## 2011-12-19 NOTE — Discharge Summary (Signed)
Obstetric Discharge Summary Reason for Admission: onset of labor Prenatal Procedures: none Intrapartum Procedures: spontaneous vaginal delivery Postpartum Procedures: none Complications-Operative and Postpartum: hemorrhage from persistent uterine atony up to 1 day post partum which responded well to methergine Hemoglobin  Date Value Range Status  12/19/2011 8.5* 12.0-15.0 (g/dL) Final     HCT  Date Value Range Status  12/19/2011 26.1* 36.0-46.0 (%) Final    Physical Exam:  General: alert, cooperative and appears stated age 21: appropriate Uterine Fundus: firm DVT Evaluation: No evidence of DVT seen on physical exam. Trace LE edema  Orthostatic SBP appropriate (lying 99, sitting 106, and standing 109 w/ HR ranging from 83-93)   Discharge Diagnoses: Post-date pregnancy  Discharge Information: Date: 12/19/2011 Activity: unrestricted and pelvic rest Diet: routine Medications: PNV, Ibuprofen, Colace and Percocet Condition: stable and improved Instructions: refer to practice specific booklet Discharge to: home   Newborn Data: Live born female  Birth Weight: 7 lb 8 oz (3402 g) APGAR: 8, 9  Home with mother.  Kimberly Dunn 12/19/2011, 7:40 AM  Patient seen and examined.  Agree with above note.    Kimberly Dunn 12/19/2011 11:23 AM

## 2011-12-19 NOTE — H&P (Signed)
Agree with note. 

## 2011-12-19 NOTE — Discharge Instructions (Signed)
Vaginal Delivery Care After  Change your pad on each trip to the bathroom.   Wipe gently with toilet paper during your hospital stay. Always wipe from front to back. A spray bottle with warm tap water could also be used or a towelette if available.   Place your soiled pad and toilet paper in a bathroom wastebasket with a plastic bag liner.   During your hospital stay, save any clots. If you pass a clot while on the toilet, do not flush it. Also, if your vaginal flow seems excessive to you, notify nursing personnel.   The first time you get out of bed after delivery, wait for assistance from a nurse. Do not get up alone at any time if you feel weak or dizzy.   Bend and extend your ankles forcefully so that you feel the calves of your legs get hard. Do this 6 times every hour when you are in bed and awake.   Do not sit with one foot under you, dangle your legs over the edge of the bed, or maintain a position that hinders the circulation in your legs.   Many women experience after pains for 2 to 3 days after delivery. These after pains are mild uterine contractions. Ask the nurse for a pain medication if you need something for this. Sometimes breastfeeding stimulates after pains; if you find this to be true, ask for the medication  -  hour before the next feeding.   For you and your infant's protection, do not go beyond the door(s) of the obstetric unit. Do not carry your baby in your arms in the hallway. When taking your baby to and from your room, put your baby in the bassinet and push the bassinet.   Mothers may have their babies in their room as much as they desire.  Document Released: 08/23/2000 Document Revised: 08/15/2011 Document Reviewed: 07/24/2007 [employer] Patient Information 2012 [employer]. 

## 2011-12-19 NOTE — Progress Notes (Signed)
Patient was referred for history of depression/anxiety.  * Referral screened out by Clinical Social Worker because none of the following criteria appear to apply:  ~ History of anxiety/depression during this pregnancy, or of post-partum depression.  ~ Diagnosis of anxiety and/or depression within last 3 years  ~ History of depression due to pregnancy loss/loss of child  OR  * Patient's symptoms currently being treated with medication and/or therapy.  Please contact the Clinical Social Worker if needs arise, or by the patient's request.  Pt is currently being seen by a therapist, as per pt.       

## 2012-01-07 NOTE — Progress Notes (Signed)
UR Chart review completed.  

## 2012-01-15 ENCOUNTER — Ambulatory Visit: Payer: Medicaid Other | Admitting: Advanced Practice Midwife

## 2012-01-15 ENCOUNTER — Encounter: Payer: Self-pay | Admitting: Advanced Practice Midwife

## 2012-01-15 VITALS — BP 108/70 | HR 78 | Temp 97.9°F | Ht 67.0 in | Wt 214.0 lb

## 2012-01-15 DIAGNOSIS — R300 Dysuria: Secondary | ICD-10-CM

## 2012-01-15 DIAGNOSIS — N390 Urinary tract infection, site not specified: Secondary | ICD-10-CM

## 2012-01-15 DIAGNOSIS — N898 Other specified noninflammatory disorders of vagina: Secondary | ICD-10-CM

## 2012-01-15 LAB — POCT URINALYSIS DIP (DEVICE)
Glucose, UA: NEGATIVE mg/dL
Ketones, ur: NEGATIVE mg/dL
Protein, ur: NEGATIVE mg/dL
Specific Gravity, Urine: >=1.03 (ref 1.005–1.030)
Urobilinogen, UA: 0.2 mg/dL (ref 0.0–1.0)

## 2012-01-15 MED ORDER — SULFAMETHOXAZOLE-TRIMETHOPRIM 800-160 MG PO TABS: 1.0000 | ORAL_TABLET | Freq: Two times a day (BID) | ORAL | Status: AC

## 2012-01-15 NOTE — Progress Notes (Unsigned)
Patient reports burning and pain upon urination- also causes some abdominal pain. Had a physical yesterday and was told she had a UTI but has not been prescribed any medications. Clean catch UA done

## 2012-01-15 NOTE — Patient Instructions (Signed)
Breastfeeding BENEFITS OF BREASTFEEDING For the baby  The first milk (colostrum) helps the baby's digestive system function better.   There are antibodies from the mother in the milk that help the baby fight off infections.   The baby has a lower incidence of asthma, allergies, and SIDS (sudden infant death syndrome).   The nutrients in breast milk are better than formulas for the baby and helps the baby's brain grow better.   Babies who breastfeed have less gas, colic, and constipation.  For the mother  Breastfeeding helps develop a very special bond between mother and baby.   It is more convenient, always available at the correct temperature and cheaper than formula feeding.   It burns calories in the mother and helps with losing weight that was gained during pregnancy.   It makes the uterus contract back down to normal size faster and slows bleeding following delivery.   Breastfeeding mothers have a lower risk of developing breast cancer.  NURSE FREQUENTLY  A healthy, full-term baby may breastfeed as often as every hour or space his or her feedings to every 3 hours.   How often to nurse will vary from baby to baby. Watch your baby for signs of hunger, not the clock.   Nurse as often as the baby requests, or when you feel the need to reduce the fullness of your breasts.   Awaken the baby if it has been 3 to 4 hours since the last feeding.   Frequent feeding will help the mother make more milk and will prevent problems like sore nipples and engorgement of the breasts.  BABY'S POSITION AT THE BREAST  Whether lying down or sitting, be sure that the baby's tummy is facing your tummy.   Support the breast with 4 fingers underneath the breast and the thumb above. Make sure your fingers are well away from the nipple and baby's mouth.   Stroke the baby's lips and cheek closest to the breast gently with your finger or nipple.   When the baby's mouth is open wide enough, place all  of your nipple and as much of the dark area around the nipple as possible into your baby's mouth.   Pull the baby in close so the tip of the nose and the baby's cheeks touch the breast during the feeding.  FEEDINGS  The length of each feeding varies from baby to baby and from feeding to feeding.   The baby must suck about 2 to 3 minutes for your milk to get to him or her. This is called a "let down." For this reason, allow the baby to feed on each breast as long as he or she wants. Your baby will end the feeding when he or she has received the right balance of nutrients.   To break the suction, put your finger into the corner of the baby's mouth and slide it between his or her gums before removing your breast from his or her mouth. This will help prevent sore nipples.  REDUCING BREAST ENGORGEMENT  In the first week after your baby is born, you may experience signs of breast engorgement. When breasts are engorged, they feel heavy, warm, full, and may be tender to the touch. You can reduce engorgement if you:   Nurse frequently, every 2 to 3 hours. Mothers who breastfeed early and often have fewer problems with engorgement.   Place light ice packs on your breasts between feedings. This reduces swelling. Wrap the ice packs in a   lightweight towel to protect your skin.   Apply moist hot packs to your breast for 5 to 10 minutes before each feeding. This increases circulation and helps the milk flow.   Gently massage your breast before and during the feeding.   Make sure that the baby empties at least one breast at every feeding before switching sides.   Use a breast pump to empty the breasts if your baby is sleepy or not nursing well. You may also want to pump if you are returning to work or or you feel you are getting engorged.   Avoid bottle feeds, pacifiers or supplemental feedings of water or juice in place of breastfeeding.   Be sure the baby is latched on and positioned properly while  breastfeeding.   Prevent fatigue, stress, and anemia.   Wear a supportive bra, avoiding underwire styles.   Eat a balanced diet with enough fluids.  If you follow these suggestions, your engorgement should improve in 24 to 48 hours. If you are still experiencing difficulty, call your lactation consultant or caregiver. IS MY BABY GETTING ENOUGH MILK? Sometimes, mothers worry about whether their babies are getting enough milk. You can be assured that your baby is getting enough milk if:  The baby is actively sucking and you hear swallowing.   The baby nurses at least 8 to 12 times in a 24 hour time period. Nurse your baby until he or she unlatches or falls asleep at the first breast (at least 10 to 20 minutes), then offer the second side.   The baby is wetting 5 to 6 disposable diapers (6 to 8 cloth diapers) in a 24 hour period by 5 to 6 days of age.   The baby is having at least 2 to 3 stools every 24 hours for the first few months. Breast milk is all the food your baby needs. It is not necessary for your baby to have water or formula. In fact, to help your breasts make more milk, it is best not to give your baby supplemental feedings during the early weeks.   The stool should be soft and yellow.   The baby should gain 4 to 7 ounces per week after he is 4 days old.  TAKE CARE OF YOURSELF Take care of your breasts by:  Bathing or showering daily.   Avoiding the use of soaps on your nipples.   Start feedings on your left breast at one feeding and on your right breast at the next feeding.   You will notice an increase in your milk supply 2 to 5 days after delivery. You may feel some discomfort from engorgement, which makes your breasts very firm and often tender. Engorgement "peaks" out within 24 to 48 hours. In the meantime, apply warm moist towels to your breasts for 5 to 10 minutes before feeding. Gentle massage and expression of some milk before feeding will soften your breasts, making  it easier for your baby to latch on. Wear a well fitting nursing bra and air dry your nipples for 10 to 15 minutes after each feeding.   Only use cotton bra pads.   Only use pure lanolin on your nipples after nursing. You do not need to wash it off before nursing.  Take care of yourself by:   Eating well-balanced meals and nutritious snacks.   Drinking milk, fruit juice, and water to satisfy your thirst (about 8 glasses a day).   Getting plenty of rest.   Increasing calcium in   your diet (1200 mg a day).   Avoiding foods that you notice affect the baby in a bad way.  SEEK MEDICAL CARE IF:   You have any questions or difficulty with breastfeeding.   You need help.   You have a hard, red, sore area on your breast, accompanied by a fever of 100.5 F (38.1 C) or more.   Your baby is too sleepy to eat well or is having trouble sleeping.   Your baby is wetting less than 6 diapers per day, by 35 days of age.   Your baby's skin or white part of his or her eyes is more yellow than it was in the hospital.   You feel depressed.  Document Released: 08/26/2005 Document Revised: 08/15/2011 Document Reviewed: 04/10/2009 [facility] Patient Information 2012 [place], [place]. Urinary Tract Infection Infections of the urinary tract can start in several places. A bladder infection (cystitis), a kidney infection (pyelonephritis), and a prostate infection (prostatitis) are different types of urinary tract infections (UTIs). They usually get better if treated with medicines (antibiotics) that kill germs. Take all the medicine until it is gone. You or your child may feel better in a few days, but TAKE ALL MEDICINE or the infection may not respond and may become more difficult to treat. HOME CARE INSTRUCTIONS   Drink enough water and fluids to keep the urine clear or pale yellow. Cranberry juice is especially recommended, in addition to large amounts of water.   Avoid caffeine, tea, and carbonated  beverages. They tend to irritate the bladder.   Alcohol may irritate the prostate.   Only take over-the-counter or prescription medicines for pain, discomfort, or fever as directed by your caregiver.  To prevent further infections:  Empty the bladder often. Avoid holding urine for long periods of time.   After a bowel movement, women should cleanse from front to back. Use each tissue only once.   Empty the bladder before and after sexual intercourse.  FINDING OUT THE RESULTS OF YOUR TEST Not all test results are available during your visit. If your or your child's test results are not back during the visit, make an appointment with your caregiver to find out the results. Do not assume everything is normal if you have not heard from your caregiver or the medical facility. It is important for you to follow up on all test results. SEEK MEDICAL CARE IF:   There is back pain.   Your baby is older than 3 months with a rectal temperature of 100.5 F (38.1 C) or higher for more than 1 day.   Your or your child's problems (symptoms) are no better in 3 days. Return sooner if you or your child is getting worse.  SEEK IMMEDIATE MEDICAL CARE IF:   There is severe back pain or lower abdominal pain.   You or your child develops chills.   You have a fever.   Your baby is older than 3 months with a rectal temperature of 102 F (38.9 C) or higher.   Your baby is 74 months old or younger with a rectal temperature of 100.4 F (38 C) or higher.   There is nausea or vomiting.   There is continued burning or discomfort with urination.  MAKE SURE YOU:   Understand these instructions.   Will watch your condition.   Will get help right away if you are not doing well or get worse.  Document Released: 06/05/2005 Document Revised: 08/15/2011 Document Reviewed: 01/08/2007 [employer] Patient Information 2012  [employer]. Contraception Choices Contraception (birth control) is the use of any methods  or devices to prevent pregnancy. Below are some methods to help avoid pregnancy. HORMONAL METHODS   Contraceptive implant. This is a thin, plastic tube containing progesterone hormone. It does not contain estrogen hormone. Your caregiver inserts the tube in the inner part of the upper arm. The tube can remain in place for up to 3 years. After 3 years, the implant must be removed. The implant prevents the ovaries from releasing an egg (ovulation), thickens the cervical mucus which prevents sperm from entering the uterus, and thins the lining of the inside of the uterus.   Progesterone-only injections. These injections are given every 3 months by your caregiver to prevent pregnancy. This synthetic progesterone hormone stops the ovaries from releasing eggs. It also thickens cervical mucus and changes the uterine lining. This makes it harder for sperm to survive in the uterus.   Birth control pills. These pills contain estrogen and progesterone hormone. They work by stopping the egg from forming in the ovary (ovulation). Birth control pills are prescribed by a caregiver.Birth control pills can also be used to treat heavy periods.   Minipill. This type of birth control pill contains only the progesterone hormone. They are taken every day of each month and must be prescribed by your caregiver.   Birth control patch. The patch contains hormones similar to those in birth control pills. It must be changed once a week and is prescribed by a caregiver.   Vaginal ring. The ring contains hormones similar to those in birth control pills. It is left in the vagina for 3 weeks, removed for 1 week, and then a new one is put back in place. The patient must be comfortable inserting and removing the ring from the vagina.A caregiver's prescription is necessary.   Emergency contraception. Emergency contraceptives prevent pregnancy after unprotected sexual intercourse. This pill can be taken right after sex or up to 5 days  after unprotected sex. It is most effective the sooner you take the pills after having sexual intercourse. Emergency contraceptive pills are available without a prescription. Check with your pharmacist. Do not use emergency contraception as your only form of birth control.  BARRIER METHODS   Female condom. This is a thin sheath (latex or rubber) that is worn over the penis during sexual intercourse. It can be used with spermicide to increase effectiveness.   Female condom. This is a soft, loose-fitting sheath that is put into the vagina before sexual intercourse.   Diaphragm. This is a soft, latex, dome-shaped barrier that must be fitted by a caregiver. It is inserted into the vagina, along with a spermicidal jelly. It is inserted before intercourse. The diaphragm should be left in the vagina for 6 to 8 hours after intercourse.   Cervical cap. This is a round, soft, latex or plastic cup that fits over the cervix and must be fitted by a caregiver. The cap can be left in place for up to 48 hours after intercourse.   Sponge. This is a soft, circular piece of polyurethane foam. The sponge has spermicide in it. It is inserted into the vagina after wetting it and before sexual intercourse.   Spermicides. These are chemicals that kill or block sperm from entering the cervix and uterus. They come in the form of creams, jellies, suppositories, foam, or tablets. They do not require a prescription. They are inserted into the vagina with an applicator before having sexual intercourse. The  process must be repeated every time you have sexual intercourse.  INTRAUTERINE CONTRACEPTION  Intrauterine device (IUD). This is a T-shaped device that is put in a woman's uterus during a menstrual period to prevent pregnancy. There are 2 types:   Copper IUD. This type of IUD is wrapped in copper wire and is placed inside the uterus. Copper makes the uterus and fallopian tubes produce a fluid that kills sperm. It can stay in  place for 10 years.   Hormone IUD. This type of IUD contains the hormone progestin (synthetic progesterone). The hormone thickens the cervical mucus and prevents sperm from entering the uterus, and it also thins the uterine lining to prevent implantation of a fertilized egg. The hormone can weaken or kill the sperm that get into the uterus. It can stay in place for 5 years.  PERMANENT METHODS OF CONTRACEPTION  Female tubal ligation. This is when the woman's fallopian tubes are surgically sealed, tied, or blocked to prevent the egg from traveling to the uterus.   Female sterilization. This is when the female has the tubes that carry sperm tied off (vasectomy).This blocks sperm from entering the vagina during sexual intercourse. After the procedure, the man can still ejaculate fluid (semen).  NATURAL PLANNING METHODS  Natural family planning. This is not having sexual intercourse or using a barrier method (condom, diaphragm, cervical cap) on days the woman could become pregnant.   Calendar method. This is keeping track of the length of each menstrual cycle and identifying when you are fertile.   Ovulation method. This is avoiding sexual intercourse during ovulation.   Symptothermal method. This is avoiding sexual intercourse during ovulation, using a thermometer and ovulation symptoms.   Post-ovulation method. This is timing sexual intercourse after you have ovulated.  Regardless of which type or method of contraception you choose, it is important that you use condoms to protect against the transmission of sexually transmitted diseases (STDs). Talk with your caregiver about which form of contraception is most appropriate for you. Document Released: 08/26/2005 Document Revised: 08/15/2011 Document Reviewed: 01/02/2011 [facility] Patient Information 2012 [place], [place].

## 2012-01-15 NOTE — Progress Notes (Unsigned)
  Subjective:     Kimberly Dunn is a 21 y.o. female who presents for a postpartum visit. She is {1-10:13787} {time; units:18646} postpartum following a {delivery:12449}. I have fully reviewed the prenatal and intrapartum course. The delivery was at *** gestational weeks. Outcome: {delivery outcome:32078}. Anesthesia: {anesthesia types:812}. Postpartum course has been ***. Baby's course has been ***. Baby is feeding by {breast/bottle:69}. Bleeding {vag bleed:12292}. Bowel function is {normal:32111}. Bladder function is {normal:32111}. Patient {is/is not:9024} sexually active. Contraception method is {contraceptive method:5051}. Postpartum depression screening: {neg default:13464::"negative"}.  {Common ambulatory SmartLinks:19316}  Review of Systems {ros; complete:30496}   Objective:    BP 108/70  Pulse 78  Temp(Src) 97.9 F (36.6 C) (Oral)  Ht 5\' 7"  (1.702 m)  Wt 214 lb (97.07 kg)  BMI 33.52 kg/m2  Breastfeeding? Yes  General:  {gen appearance:16600}   Breasts:  {breast exam:1202::"inspection negative, no nipple discharge or bleeding, no masses or nodularity palpable"}  Lungs: {lung exam:16931}  Heart:  {heart exam:5510}  Abdomen: {abdomen exam:16834}   Vulva:  {labia exam:12198}  Vagina: {vagina exam:12200}  Cervix:  {cervix exam:14595}  Corpus: {uterus exam:12215}  Adnexa:  {adnexa exam:12223}  Rectal Exam: {rectal/vaginal exam:12274}        Assessment:    *** postpartum exam. Pap smear {done:10129} at today's visit.   Plan:    1. Contraception: {method:5051} 2. *** 3. Follow up in: {1-10:13787} {time; units:19136} or as needed.

## 2012-01-16 LAB — WET PREP, GENITAL
Trich, Wet Prep: NONE SEEN
Yeast Wet Prep HPF POC: NONE SEEN

## 2012-02-12 ENCOUNTER — Ambulatory Visit: Payer: Medicaid Other | Admitting: Physician Assistant

## 2012-03-09 ENCOUNTER — Ambulatory Visit (INDEPENDENT_AMBULATORY_CARE_PROVIDER_SITE_OTHER): Payer: Medicaid Other | Admitting: Physician Assistant

## 2012-03-09 ENCOUNTER — Encounter: Payer: Self-pay | Admitting: Physician Assistant

## 2012-03-09 VITALS — BP 115/75 | HR 69 | Temp 97.5°F | Ht 68.0 in | Wt 214.2 lb

## 2012-03-09 DIAGNOSIS — Z01812 Encounter for preprocedural laboratory examination: Secondary | ICD-10-CM

## 2012-03-09 DIAGNOSIS — Z30017 Encounter for initial prescription of implantable subdermal contraceptive: Secondary | ICD-10-CM

## 2012-03-09 LAB — POCT PREGNANCY, URINE: Preg Test, Ur: NEGATIVE

## 2012-03-09 MED ORDER — ETONOGESTREL 68 MG ~~LOC~~ IMPL
68.0000 mg | DRUG_IMPLANT | Freq: Once | SUBCUTANEOUS | Status: AC
  Administered 2012-03-09: 68 mg via SUBCUTANEOUS

## 2012-03-09 NOTE — Patient Instructions (Signed)
Contraceptive Implant Information A contraceptive implant is a plastic rod that is inserted under the skin. It is usually inserted under the skin of your upper arm. It continually releases small amounts of progestin (synthetic progesterone) into the bloodstream. This prevents an egg from being released from the ovary. It also thickens the cervical mucus to prevent sperm from entering the cervix, and it thins the uterine lining to prevent a fertilized egg from attaching to the uterus. They can be effective for up to 3 years. Implants do not provide protection against sexually transmitted diseases (STDs).  The procedure to insert an implant usually takes about 10 minutes. There may be minor bruising, swelling, and discomfort at the insertion site for a couple days. The implant begins to work within the first day. Other contraceptive protection should be used for 2 weeks. Follow up with your caregiver to get rechecked as directed. Your caregiver will make sure you are a good candidate for the contraceptive implant. Discuss with your caregiver the possible side effects of the implant ADVANTAGES  It prevents pregnancy for up to 3 years.   It is easily reversible.   It is convenient.   The progestins may protect against uterine and ovarian cancer.   It can be used when breastfeeding.   It can be used by women who cannot take estrogen.  DISADVANTAGES  You may have irregular or unplanned vaginal bleeding.   You may develop side effects, including headache, weight gain, acne, breast tenderness, or mood changes.   You may have tissue or nerve damage after insertion (rare).   It may be difficult and uncomfortable to remove.   Certain medications may interfere with the effectiveness of the implants.  REMOVAL OF IMPLANT The implant should be removed in 3 years or as directed by your caregiver. The implants effect wears off in a few hours after removal. Your ability to get pregnant (fertility) is  restored within a couple of weeks. New implants can be inserted as soon as the old ones are removed if desired. DO NOT GET THE IMPLANT IF:   You are pregnant.   You have a history of breast cancer, osteoporosis, blood clots, heart disease, diabetes, high blood pressure, liver disease, tumors, or stroke.    You have undiagnosed vaginal bleeding.   You have overly sensitive to certain parts of the implant.  Document Released: 08/15/2011 Document Reviewed: 08/13/2011 [employer] Patient Information 2012 [employer]. 

## 2012-03-09 NOTE — Progress Notes (Signed)
Pt presents for Nexplanon insertion. Patient given informed consent, signed copy in the chart, time out was performed. Pregnancy test was negative. Appropriate time out taken.  Patient's right arm was prepped and draped in the usual sterile fashion.. The ruler used to measure and mark insertion area.  Pt was prepped with alcohol swab and then injected with 2 cc of 1% lidocaine with epinephrine.  Pt was prepped with betadine, nexplanon removed form packaging,  Device confirmed in needle, then inserted full length of needle and withdrawn per handbook instructions.  Pt insertion site covered with pressure dressing.   Minimal blood loss.  Pt tolerated the procedure well.    1. Pre-procedure lab exam    2. Nexplanon insertion  etonogestrel (IMPLANON) implant 68 mg   Remove pressure drsg in 24 hours Back-up method x 5 days.  Kimberly Dunn E. 3:57 PM

## 2012-09-21 ENCOUNTER — Encounter (HOSPITAL_COMMUNITY): Payer: Self-pay | Admitting: *Deleted

## 2012-09-21 ENCOUNTER — Emergency Department (HOSPITAL_COMMUNITY)
Admission: EM | Admit: 2012-09-21 | Discharge: 2012-09-21 | Discharge status: Against Medical Advice | Payer: Self-pay | Attending: Emergency Medicine | Admitting: Emergency Medicine

## 2012-09-21 DIAGNOSIS — R197 Diarrhea, unspecified: Secondary | ICD-10-CM | POA: Insufficient documentation

## 2012-09-21 DIAGNOSIS — R112 Nausea with vomiting, unspecified: Secondary | ICD-10-CM | POA: Insufficient documentation

## 2012-09-21 DIAGNOSIS — Z3202 Encounter for pregnancy test, result negative: Secondary | ICD-10-CM | POA: Insufficient documentation

## 2012-09-21 DIAGNOSIS — R1033 Periumbilical pain: Secondary | ICD-10-CM | POA: Insufficient documentation

## 2012-09-21 LAB — URINALYSIS, ROUTINE W REFLEX MICROSCOPIC
Glucose, UA: NEGATIVE mg/dL
Ketones, ur: 15 mg/dL — AB
Nitrite: NEGATIVE
Protein, ur: NEGATIVE mg/dL
pH: 5.5 (ref 5.0–8.0)

## 2012-09-21 LAB — URINE MICROSCOPIC-ADD ON

## 2012-09-21 LAB — POCT PREGNANCY, URINE: Preg Test, Ur: NEGATIVE

## 2012-09-21 NOTE — ED Notes (Signed)
Patient with abdominal pain since last night.  Patient took 2 diet pills together and that is when her stomach started hurting.  She took hydroxyut and zantrax.  Patient also c/o n/v and diarrhea.  Pain is located on her umbilical area and radiates to her her sides.

## 2012-09-22 LAB — COMPREHENSIVE METABOLIC PANEL
ALT: 7 U/L (ref 0–35)
AST: 13 U/L (ref 0–37)
Albumin: 4.5 g/dL (ref 3.5–5.2)
Calcium: 9.7 mg/dL (ref 8.4–10.5)
Creatinine, Ser: 0.75 mg/dL (ref 0.50–1.10)
Sodium: 141 mEq/L (ref 135–145)
Total Protein: 8.5 g/dL — ABNORMAL HIGH (ref 6.0–8.3)

## 2012-09-24 ENCOUNTER — Emergency Department (HOSPITAL_COMMUNITY)
Admission: EM | Admit: 2012-09-24 | Discharge: 2012-09-24 | Disposition: A | Discharge status: Home / Self Care | Payer: Self-pay | Attending: Emergency Medicine | Admitting: Emergency Medicine

## 2012-09-24 ENCOUNTER — Encounter (HOSPITAL_COMMUNITY): Payer: Self-pay | Admitting: Emergency Medicine

## 2012-09-24 DIAGNOSIS — Z9089 Acquired absence of other organs: Secondary | ICD-10-CM | POA: Insufficient documentation

## 2012-09-24 DIAGNOSIS — R109 Unspecified abdominal pain: Secondary | ICD-10-CM

## 2012-09-24 DIAGNOSIS — R197 Diarrhea, unspecified: Secondary | ICD-10-CM | POA: Insufficient documentation

## 2012-09-24 DIAGNOSIS — Z862 Personal history of diseases of the blood and blood-forming organs and certain disorders involving the immune mechanism: Secondary | ICD-10-CM | POA: Insufficient documentation

## 2012-09-24 DIAGNOSIS — Z79899 Other long term (current) drug therapy: Secondary | ICD-10-CM | POA: Insufficient documentation

## 2012-09-24 DIAGNOSIS — R110 Nausea: Secondary | ICD-10-CM | POA: Insufficient documentation

## 2012-09-24 DIAGNOSIS — Z3202 Encounter for pregnancy test, result negative: Secondary | ICD-10-CM | POA: Insufficient documentation

## 2012-09-24 DIAGNOSIS — R1013 Epigastric pain: Secondary | ICD-10-CM | POA: Insufficient documentation

## 2012-09-24 DIAGNOSIS — Z8619 Personal history of other infectious and parasitic diseases: Secondary | ICD-10-CM | POA: Insufficient documentation

## 2012-09-24 LAB — POCT PREGNANCY, URINE: Preg Test, Ur: NEGATIVE

## 2012-09-24 LAB — CBC WITH DIFFERENTIAL/PLATELET
Basophils Absolute: 0 10*3/uL (ref 0.0–0.1)
Eosinophils Relative: 2 % (ref 0–5)
Lymphocytes Relative: 30 % (ref 12–46)
MCV: 82.2 fL (ref 78.0–100.0)
Neutro Abs: 2.5 10*3/uL (ref 1.7–7.7)
Neutrophils Relative %: 59 % (ref 43–77)
Platelets: 285 10*3/uL (ref 150–400)
RBC: 4.16 MIL/uL (ref 3.87–5.11)
RDW: 13.6 % (ref 11.5–15.5)
WBC: 4.2 10*3/uL (ref 4.0–10.5)

## 2012-09-24 LAB — COMPREHENSIVE METABOLIC PANEL
ALT: 11 U/L (ref 0–35)
AST: 14 U/L (ref 0–37)
Alkaline Phosphatase: 48 U/L (ref 39–117)
CO2: 24 mEq/L (ref 19–32)
Calcium: 8.7 mg/dL (ref 8.4–10.5)
GFR calc non Af Amer: >90 mL/min (ref >90)
Potassium: 3.3 mEq/L — ABNORMAL LOW (ref 3.5–5.1)
Sodium: 140 mEq/L (ref 135–145)

## 2012-09-24 LAB — URINALYSIS, MICROSCOPIC ONLY
Glucose, UA: NEGATIVE mg/dL
Protein, ur: NEGATIVE mg/dL
Specific Gravity, Urine: 1.021 (ref 1.005–1.030)

## 2012-09-24 MED ORDER — GI COCKTAIL ~~LOC~~
30.0000 mL | Freq: Once | ORAL | Status: AC
  Administered 2012-09-24: 30 mL via ORAL
  Filled 2012-09-24: qty 30

## 2012-09-24 MED ORDER — LORAZEPAM 2 MG/ML IJ SOLN
1.0000 mg | Freq: Once | INTRAMUSCULAR | Status: AC
  Administered 2012-09-24: 1 mg via INTRAVENOUS
  Filled 2012-09-24: qty 1

## 2012-09-24 MED ORDER — PANTOPRAZOLE SODIUM 40 MG PO TBEC: 40.0000 mg | DELAYED_RELEASE_TABLET | Freq: Every day | ORAL | Status: DC

## 2012-09-24 MED ORDER — SODIUM CHLORIDE 0.9 % IV BOLUS (SEPSIS)
1000.0000 mL | Freq: Once | INTRAVENOUS | Status: AC
  Administered 2012-09-24: 1000 mL via INTRAVENOUS

## 2012-09-24 MED ORDER — PANTOPRAZOLE SODIUM 40 MG IV SOLR
40.0000 mg | Freq: Once | INTRAVENOUS | Status: AC
  Administered 2012-09-24: 40 mg via INTRAVENOUS
  Filled 2012-09-24: qty 40

## 2012-09-24 MED ORDER — MORPHINE SULFATE 4 MG/ML IJ SOLN
4.0000 mg | Freq: Once | INTRAMUSCULAR | Status: AC
  Administered 2012-09-24: 4 mg via INTRAVENOUS
  Filled 2012-09-24: qty 1

## 2012-09-24 MED ORDER — SODIUM CHLORIDE 0.9 % IV SOLN
1000.0000 mL | Freq: Once | INTRAVENOUS | Status: AC
  Administered 2012-09-24: 1000 mL via INTRAVENOUS

## 2012-09-24 MED ORDER — FAMOTIDINE 20 MG PO TABS
20.0000 mg | ORAL_TABLET | Freq: Once | ORAL | Status: AC
  Administered 2012-09-24: 20 mg via ORAL
  Filled 2012-09-24: qty 1

## 2012-09-24 MED ORDER — ONDANSETRON HCL 4 MG/2ML IJ SOLN
4.0000 mg | Freq: Once | INTRAMUSCULAR | Status: AC
  Administered 2012-09-24: 4 mg via INTRAVENOUS
  Filled 2012-09-24: qty 2

## 2012-09-24 NOTE — ED Provider Notes (Signed)
History     CSN: [id number]  Arrival date & time 09/24/12  0841   First MD Initiated Contact with Patient 09/24/12 [phone]      Chief Complaint  Patient presents with  . Abdominal Pain    (Consider location/radiation/quality/duration/timing/severity/associated sxs/prior treatment) Patient is a 22 y.o. female presenting with abdominal pain. The history is provided by the patient.  Abdominal Pain The primary symptoms of the illness include abdominal pain. The primary symptoms of the illness do not include fever, shortness of breath or dysuria.  Symptoms associated with the illness do not include chills, constipation or back pain.  pt c/o epigastric pain for past 3-4 days, constant, but waxes and wanes a bit in intensity. No radiation or back pain. No hx same pain. Occasionally worse w eating. No other specific exacerbating or allev factors. Nausea, no vomiting. Few episodes diarrhea, not bloody or bilious. No abd distension. Hx cholecystectomy. No mid to lower abd or pelvic pain. No dysuria or gu c/o. No cp or sob. No cough or uri c/o. No hx pancreatitis or pud. No known ill contacts or bad food ingestion.     Past Medical History  Diagnosis Date  . Anemia   . Abnormal Pap smear   . HPV (human papilloma virus) infection     Past Surgical History  Procedure Date  . Tonsillectomy   . Cholecystectomy   . Condyloma excision/fulguration     Family History  Problem Relation Age of Onset  . Anesthesia problems Neg Hx   . Diabetes Mother   . Hypertension Father   . Hypertension Sister   . Anemia Sister   . Depression Sister     History  Substance Use Topics  . Smoking status: Never Smoker   . Smokeless tobacco: Never Used  . Alcohol Use: No    OB History    Grav Para Term Preterm Abortions TAB SAB Ect Mult Living   1 1 1  0 0 0 0 0 0 1      Review of Systems  Constitutional: Negative for fever and chills.  HENT: Negative for neck pain.   Eyes: Negative for redness.    Respiratory: Negative for shortness of breath.   Cardiovascular: Negative for chest pain.  Gastrointestinal: Positive for abdominal pain. Negative for constipation.  Genitourinary: Negative for dysuria and flank pain.  Musculoskeletal: Negative for back pain.  Skin: Negative for rash.  Neurological: Negative for headaches.  Hematological: Does not bruise/bleed easily.  Psychiatric/Behavioral: Negative for confusion.    Allergies  Review of patient's allergies indicates no known allergies.  Home Medications   Current Outpatient Rx  Name  Route  Sig  Dispense  Refill  . OVER THE COUNTER MEDICATION      Pt states she is taking a medication called hydroxycut and xantrax diet supplement           BP 120/72  Pulse 85  Temp 98.7 F (37.1 C) (Oral)  Resp 18  SpO2 100%  Physical Exam  Nursing note and vitals reviewed. Constitutional: She is oriented to person, place, and time. She appears well-developed and well-nourished. No distress.  HENT:  Nose: Nose normal.  Mouth/Throat: Oropharynx is clear and moist.  Eyes: Conjunctivae normal are normal. No scleral icterus.  Neck: Neck supple. No tracheal deviation present.  Cardiovascular: Normal rate, regular rhythm, normal heart sounds and intact distal pulses.   Pulmonary/Chest: Effort normal. No respiratory distress.  Abdominal: Soft. Normal appearance and bowel sounds are normal. She exhibits  no distension and no mass. There is no rebound and no guarding.       Epigastric tenderness, no rebound or guarding.   Genitourinary:       No cva tenderness  Musculoskeletal: She exhibits no edema and no tenderness.  Neurological: She is alert and oriented to person, place, and time.  Skin: Skin is warm and dry. No rash noted.  Psychiatric: She has a normal mood and affect.    ED Course  Procedures (including critical care time)  Results for orders placed during the hospital encounter of 09/24/12  CBC WITH DIFFERENTIAL       Component Value Range   WBC 4.2  4.0 - 10.5 K/uL   RBC 4.16  3.87 - 5.11 MIL/uL   Hemoglobin 11.4 (*) 12.0 - 15.0 g/dL   HCT 40.9 (*) 81.1 - 91.4 %   MCV 82.2  78.0 - 100.0 fL   MCH 27.4  26.0 - 34.0 pg   MCHC 33.3  30.0 - 36.0 g/dL   RDW 78.2  95.6 - 21.3 %   Platelets 285  150 - 400 K/uL   Neutrophils Relative 59  43 - 77 %   Neutro Abs 2.5  1.7 - 7.7 K/uL   Lymphocytes Relative 30  12 - 46 %   Lymphs Abs 1.3  0.7 - 4.0 K/uL   Monocytes Relative 9  3 - 12 %   Monocytes Absolute 0.4  0.1 - 1.0 K/uL   Eosinophils Relative 2  0 - 5 %   Eosinophils Absolute 0.1  0.0 - 0.7 K/uL   Basophils Relative 1  0 - 1 %   Basophils Absolute 0.0  0.0 - 0.1 K/uL  COMPREHENSIVE METABOLIC PANEL      Component Value Range   Sodium 140  135 - 145 mEq/L   Potassium 3.3 (*) 3.5 - 5.1 mEq/L   Chloride 106  96 - 112 mEq/L   CO2 24  19 - 32 mEq/L   Glucose, Bld 95  70 - 99 mg/dL   BUN 6  6 - 23 mg/dL   Creatinine, Ser 0.86  0.50 - 1.10 mg/dL   Calcium 8.7  8.4 - 57.8 mg/dL   Total Protein 6.9  6.0 - 8.3 g/dL   Albumin 3.6  3.5 - 5.2 g/dL   AST 14  0 - 37 U/L   ALT 11  0 - 35 U/L   Alkaline Phosphatase 48  39 - 117 U/L   Total Bilirubin 0.2 (*) 0.3 - 1.2 mg/dL   GFR calc non Af Amer >90  >90 mL/min   GFR calc Af Amer >90  >90 mL/min  LIPASE, BLOOD      Component Value Range   Lipase 22  11 - 59 U/L  URINALYSIS, MICROSCOPIC ONLY      Component Value Range   Color, Urine YELLOW  YELLOW   APPearance CLOUDY (*) CLEAR   Specific Gravity, Urine 1.021  1.005 - 1.030   pH 5.5  5.0 - 8.0   Glucose, UA NEGATIVE  NEGATIVE mg/dL   Hgb urine dipstick LARGE (*) NEGATIVE   Bilirubin Urine NEGATIVE  NEGATIVE   Ketones, ur NEGATIVE  NEGATIVE mg/dL   Protein, ur NEGATIVE  NEGATIVE mg/dL   Urobilinogen, UA 0.2  0.0 - 1.0 mg/dL   Nitrite NEGATIVE  NEGATIVE   Leukocytes, UA SMALL (*) NEGATIVE   WBC, UA 3-6  <3 WBC/hpf   RBC / HPF 11-20  <3 RBC/hpf  Bacteria, UA MANY (*) RARE   Squamous Epithelial / LPF  FEW (*) RARE  POCT PREGNANCY, URINE      Component Value Range   Preg Test, Ur NEGATIVE  NEGATIVE       MDM  Iv ns bolus. protonix iv. zofran iv.   Reviewed nursing notes and prior charts for additional history.   Pt states is not breastfeeding.  Morphine iv.   abd soft nt.  Labs still pending.   Recheck abd soft nt. No pain.   Pt stable for d/c.       Suzi Roots, MD 09/24/12 [phone]

## 2012-09-24 NOTE — ED Notes (Signed)
Pt escorted to discharge window. Verbalized understanding discharge instructions. In no acute distress.   

## 2012-09-24 NOTE — ED Notes (Signed)
MD at bedside. 

## 2012-09-24 NOTE — ED Notes (Signed)
Pt c/o n/v/d, since Monday.

## 2012-09-24 NOTE — ED Notes (Signed)
Pt presenting to ed with c/o abdominal pain with nausea, vomiting and diarrhea x 4 days

## 2013-04-01 ENCOUNTER — Ambulatory Visit (INDEPENDENT_AMBULATORY_CARE_PROVIDER_SITE_OTHER): Payer: Medicaid Other | Admitting: Obstetrics & Gynecology

## 2013-04-01 ENCOUNTER — Encounter: Payer: Self-pay | Admitting: Obstetrics & Gynecology

## 2013-04-01 VITALS — BP 110/77 | HR 80 | Temp 97.5°F | Ht 68.0 in | Wt 221.2 lb

## 2013-04-01 DIAGNOSIS — F329 Major depressive disorder, single episode, unspecified: Secondary | ICD-10-CM

## 2013-04-01 DIAGNOSIS — Z309 Encounter for contraceptive management, unspecified: Secondary | ICD-10-CM

## 2013-04-01 MED ORDER — IBUPROFEN 600 MG PO TABS: 600.0000 mg | ORAL_TABLET | Freq: Four times a day (QID) | ORAL | Status: DC | PRN

## 2013-04-01 MED ORDER — ESCITALOPRAM OXALATE 10 MG PO TABS: 10.0000 mg | ORAL_TABLET | Freq: Every day | ORAL | Status: DC

## 2013-04-01 NOTE — Progress Notes (Signed)
Subjective:     Patient ID: Posey Boyer, female   DOB: July 01, 1991, 22 y.o.   MRN: [id number]  HPI  Here for nexplanon removal - having significant pain without menstrual bleeding each month - headaches each morning for the last 3 months  - had it put it 03/09/12 - no problems in first 9months but this year has been a problem.   - wants to do OCPs instead- did well on them in the past but had her daughter because she didn't take it on time - not a smoker - thinks she might have been on orthocyclen  Has a pmhx of depression and was on zoloft in the past (hx of prior suicide attempts)  Wondering if she needs to be on medication again - no SI/HI - tearful often, does have trouble sleeping - some anhedonia    Review of Systems No fevers, abd pain, weakness, vision changes     Objective:   Physical Exam Filed Vitals:   04/01/13 1450  BP: 110/77  Pulse: 80  Temp: 97.5 F (36.4 C)     GEN: NAD CV: RRR PULM: LCTAB EXT: nexplanon palpated in right arm PSYCH: no obvious depression anxiety, great affect     Assessment:     Depression  Contraception management       Plan:     1) Contraceptive management - discussed with all of above information options including nexplanon removal, treating cramping and leaving in place. - after long discussion, pt opted to leave nexplanon in place - will treat cramps with ibuprofen 600mg  po prn - advised to let us know should symptoms not improve.   2) headaches - direct correlation with stopping drinking >5 sodas a day and cutting out caffeine completely - advised trial of small amount of caffeine to see if this improves headaches  3) depression - appears to be a relapse of chronic depression - was highly activated when on zoloft - will try lexapro 10mg  daily - no SI/HI - urged to f/u with PCP for further management. Pt states will make appt in 2-4 weeks and number given.   Jasminemarie Sherrard, Redmond Baseman, MD

## 2013-04-01 NOTE — Patient Instructions (Addendum)
Please call the clinic at [phone] to schedule a family medicine appointment   Depression, Adult Depression refers to feeling sad, low, down in the dumps, blue, gloomy, or empty. In general, there are two kinds of depression: 1. Depression that we all experience from time to time because of upsetting life experiences, including the loss of a job or the ending of a relationship (normal sadness or normal grief). This kind of depression is considered normal, is short lived, and resolves within a few days to 2 weeks. (Depression experienced after the loss of a loved one is called bereavement. Bereavement often lasts longer than 2 weeks but normally gets better with time.) 2. Clinical depression, which lasts longer than normal sadness or normal grief or interferes with your ability to function at home, at work, and in school. It also interferes with your personal relationships. It affects almost every aspect of your life. Clinical depression is an illness. Symptoms of depression also can be caused by conditions other than normal sadness and grief or clinical depression. Examples of these conditions are listed as follows:  Physical illness Some physical illnesses, including underactive thyroid gland (hypothyroidism), severe anemia, specific types of cancer, diabetes, uncontrolled seizures, heart and lung problems, strokes, and chronic pain are commonly associated with symptoms of depression.  Side effects of some prescription medicine In some people, certain types of prescription medicine can cause symptoms of depression.  Substance abuse Abuse of alcohol and illicit drugs can cause symptoms of depression. SYMPTOMS Symptoms of normal sadness and normal grief include the following:  Feeling sad or crying for short periods of time.  Not caring about anything (apathy).  Difficulty sleeping or sleeping too much.  No longer able to enjoy the things you used to enjoy.  Desire to be by oneself all the  time (social isolation).  Lack of energy or motivation.  Difficulty concentrating or remembering.  Change in appetite or weight.  Restlessness or agitation. Symptoms of clinical depression include the same symptoms of normal sadness or normal grief and also the following symptoms:  Feeling sad or crying all the time.  Feelings of guilt or worthlessness.  Feelings of hopelessness or helplessness.  Thoughts of suicide or the desire to harm yourself (suicidal ideation).  Loss of touch with reality (psychotic symptoms). Seeing or hearing things that are not real (hallucinations) or having false beliefs about your life or the people around you (delusions and paranoia). DIAGNOSIS  The diagnosis of clinical depression usually is based on the severity and duration of the symptoms. Your caregiver also will ask you questions about your medical history and substance use to find out if physical illness, use of prescription medicine, or substance abuse is causing your depression. Your caregiver also may order blood tests. TREATMENT  Typically, normal sadness and normal grief do not require treatment. However, sometimes antidepressant medicine is prescribed for bereavement to ease the depressive symptoms until they resolve. The treatment for clinical depression depends on the severity of your symptoms but typically includes antidepressant medicine, counseling with a mental health professional, or a combination of both. Your caregiver will help to determine what treatment is best for you. Depression caused by physical illness usually goes away with appropriate medical treatment of the illness. If prescription medicine is causing depression, talk with your caregiver about stopping the medicine, decreasing the dose, or substituting another medicine. Depression caused by abuse of alcohol or illicit drugs abuse goes away with abstinence from these substances. Some adults need professional help in  order to  stop drinking or using drugs. SEEK IMMEDIATE CARE IF:  You have thoughts about hurting yourself or others.  You lose touch with reality (have psychotic symptoms).  You are taking medicine for depression and have a serious side effect. FOR MORE INFORMATION National Alliance on Mental Illness: www.nami.[employer]: http://www.maynard.net/ Document Released: 08/23/2000 Document Revised: 02/25/2012 Document Reviewed: 11/25/2011 [facility] Patient Information 2014 [place], [place].

## 2013-05-20 ENCOUNTER — Ambulatory Visit (INDEPENDENT_AMBULATORY_CARE_PROVIDER_SITE_OTHER): Payer: Medicaid Other | Admitting: Obstetrics & Gynecology

## 2013-05-20 ENCOUNTER — Encounter: Payer: Self-pay | Admitting: Obstetrics & Gynecology

## 2013-05-20 VITALS — BP 118/83 | HR 84 | Ht 67.0 in | Wt 218.7 lb

## 2013-05-20 DIAGNOSIS — Z309 Encounter for contraceptive management, unspecified: Secondary | ICD-10-CM

## 2013-05-20 MED ORDER — NORGESTIMATE-ETH ESTRADIOL 0.25-35 MG-MCG PO TABS: 1.0000 | ORAL_TABLET | Freq: Every day | ORAL | Status: DC

## 2013-05-20 NOTE — Patient Instructions (Signed)
Hormonal Contraception Information Estrogen and progesterone (progestin) are hormones used in many forms of birth control (contraception). These 2 hormones make up most hormonal contraceptives. Hormonal contraceptives use either:   A combination of estrogen hormone and progesterone hormone in the form of a:  Pill. Typical pill packs include 21 days of active hormone pills and 7 days of non-hormonal pills.During the non-hormone week, you will have your period. There are certain types of pills that include more days of active hormones.  Patch. The patch is placed on the lower abdomen every week for 3 weeks, and not on the fourth week.  Vaginal ring. The ring is placed in the vagina and left there for 3 weeks, and removed for 1 week.  Progesterone alone in the form of a(n):  Pill. Hormone pills are taken every day of the cycle.  Intrauterine device (IUD). The IUD is inserted during a menstrual period and removed or replaced every 5 years or less.  Implant. Plastic rods are placed under the skin of the upper arm and are removed or replaced every 3 years or less.  Injection. The injection is given once every 90 days. Pregnancy can still occur with any of these hormonal contraceptive methods. If there is any suspicion of pregnancy, take a pregnancy test and talk to your caregiver. ESTROGEN AND PROGESTERONE CONTRACEPTIVES Estrogen and progesterone contraceptives can prevent pregnancy by:  Stopping the actions of other reproductive hormones.   Stopping the release of an egg (ovulation).  Changing the lining of the uterus. This change makes it more difficult for an egg to implant. Side effects from estrogen occur more often in the first 2 or 3 months. Talk to your caregiver about what side effects may affect you. If you develop persistent side effects or they are severe, talk to your caregiver. PROGESTERONE CONTRACEPTIVES Progesterone only contraceptives can prevent pregnancy by:   Blocking  ovulation.  Preventing the entry of sperm into the uterus by keeping the cervical mucus thick and sticky.  Slowing the action of fallopian tubes to slow sperm transport.  Changing the lining of the uterus. This change makes it more difficult for an egg to implant. Side effects of progesterone can vary. Talk to your caregiver about what side effects may affect you. If you develop persistent side effects or they are severe, talk to your caregiver. Document Released: 09/15/2007 Document Revised: 11/18/2011 Document Reviewed: 12/29/2010 [employer] Patient Information 2014 [employer].  

## 2013-05-20 NOTE — Progress Notes (Signed)
Patient ID: Kimberly Dunn, female   DOB: [birth year], 22 y.o.   MRN: [id number] Patient given informed consent, she signed consent form. She has gained weight since her Nexplanon was placed.  She wants it removed due to cramping and depression.  Appropriate time out taken.  Patient's right arm was prepped and draped in the usual sterile fashion. Patient was prepped with alcohol swab and then injected with 3 ml of 1 % lidocaine.  She was prepped with betadine.  A #11 blade was used to make a small incision over the Nexplanon rod.  The rod had migrated pretty deep from its implantation site. Using curved forceps, the end of the rod was grasped and the scar tissue was removed and the device was removed intact.  There was minimal blood loss. The incision site was covered with guaze and a pressure bandage to reduce any bruising.  The patient tolerated the procedure well and was given post procedure instructions. Pt wants to use OCP's  For contraception.  Reviewed use.

## 2013-05-24 ENCOUNTER — Encounter: Payer: Self-pay | Admitting: *Deleted

## 2014-07-11 ENCOUNTER — Encounter: Payer: Self-pay | Admitting: Obstetrics & Gynecology

## 2015-01-16 DIAGNOSIS — N390 Urinary tract infection, site not specified: Secondary | ICD-10-CM | POA: Insufficient documentation

## 2015-01-16 DIAGNOSIS — Z79899 Other long term (current) drug therapy: Secondary | ICD-10-CM | POA: Insufficient documentation

## 2015-01-16 LAB — URINALYSIS COMPLETE WITH MICROSCOPIC (ARMC ONLY)
Bacteria, UA: NONE SEEN
Bilirubin Urine: NEGATIVE
Glucose, UA: 50 mg/dL — AB
Ketones, ur: NEGATIVE mg/dL
Nitrite: NEGATIVE
PROTEIN: 100 mg/dL — AB
SPECIFIC GRAVITY, URINE: 1.008 (ref 1.005–1.030)
pH: 6 (ref 5.0–8.0)

## 2015-01-16 NOTE — ED Notes (Signed)
Pt states uti symptoms x 1 weeks. Now worsened. Pt states pain with urination, frequency, scant amounts. Today she is now passing blood.

## 2015-01-17 ENCOUNTER — Emergency Department
Admission: EM | Admit: 2015-01-17 | Discharge: 2015-01-17 | Disposition: A | Discharge status: Home / Self Care | Payer: Medicaid Other | Attending: Emergency Medicine | Admitting: Emergency Medicine

## 2015-01-17 DIAGNOSIS — A499 Bacterial infection, unspecified: Secondary | ICD-10-CM

## 2015-01-17 DIAGNOSIS — N390 Urinary tract infection, site not specified: Secondary | ICD-10-CM

## 2015-01-17 MED ORDER — CIPROFLOXACIN HCL 500 MG PO TABS: 500.0000 mg | ORAL_TABLET | Freq: Once | ORAL | Status: DC

## 2015-01-17 MED ORDER — PHENAZOPYRIDINE HCL 200 MG PO TABS: 200.0000 mg | ORAL_TABLET | Freq: Once | ORAL | Status: DC

## 2015-01-17 MED ORDER — CIPROFLOXACIN HCL 500 MG PO TABS
ORAL_TABLET | ORAL | Status: AC
  Administered 2015-01-17: 500 mg via ORAL
  Filled 2015-01-17: qty 1

## 2015-01-17 MED ORDER — PHENAZOPYRIDINE HCL 200 MG PO TABS
200.0000 mg | ORAL_TABLET | Freq: Once | ORAL | Status: AC
  Administered 2015-01-17: 200 mg via ORAL

## 2015-01-17 MED ORDER — PHENAZOPYRIDINE HCL 200 MG PO TABS
ORAL_TABLET | ORAL | Status: AC
Start: 2015-01-17 — End: 2015-01-17
  Administered 2015-01-17: 200 mg via ORAL
  Filled 2015-01-17: qty 1

## 2015-01-17 MED ORDER — CIPROFLOXACIN HCL 500 MG PO TABS
500.0000 mg | ORAL_TABLET | Freq: Once | ORAL | Status: AC
  Administered 2015-01-17: 500 mg via ORAL

## 2015-01-17 NOTE — Discharge Instructions (Signed)

## 2015-01-17 NOTE — ED Provider Notes (Signed)
[facility] Emergency Department Provider Note  ____________________________________________  Time seen: 12:55 AM  I have reviewed the triage vital signs and the nursing notes.   HISTORY  Chief Complaint Urinary Tract Infection      HPI Kimberly Dunn is a 24 y.o. female presents with dysuria 1 week. patient denies any back pain, patient denies fever. Patient admits to previous episodes of urinary tract infection. Patient denies any vaginal discharge.  delete   Past Medical History  Diagnosis Date  . Anemia   . Abnormal Pap smear   . HPV (human papilloma virus) infection     Patient Active Problem List   Diagnosis Date Noted  . Fall from standing 11/23/2011  . Edema or excessive weight gain, antepartum 10/14/2011  . Anemia 10/09/2011  . Abnormal Pap smear and cervical HPV (human papillomavirus) 10/09/2011    Past Surgical History  Procedure Laterality Date  . Tonsillectomy    . Cholecystectomy    . Condyloma excision/fulguration      Current Outpatient Rx  Name  Route  Sig  Dispense  Refill  . escitalopram (LEXAPRO) 10 MG tablet   Oral   Take 1 tablet (10 mg total) by mouth daily.   30 tablet   1   . ibuprofen (ADVIL,MOTRIN) 600 MG tablet   Oral   Take 1 tablet (600 mg total) by mouth every 6 (six) hours as needed for pain.   30 tablet   1   . Multiple Vitamins-Calcium (ONE-A-DAY WOMENS PO)   Oral   Take by mouth.         . norgestimate-ethinyl estradiol (ORTHO-CYCLEN,SPRINTEC,PREVIFEM) 0.25-35 MG-MCG tablet   Oral   Take 1 tablet by mouth daily.   1 Package   11   . pantoprazole (PROTONIX) 40 MG tablet   Oral   Take 1 tablet (40 mg total) by mouth daily.   20 tablet   0     Allergies Review of patient's allergies indicates no known allergies.  Family History  Problem Relation Age of Onset  . Anesthesia problems Neg Hx   . Diabetes Mother   . Hypertension Father   . Hypertension Sister   . Anemia  Sister   . Depression Sister     Social History History  Substance Use Topics  . Smoking status: Never Smoker   . Smokeless tobacco: Never Used  . Alcohol Use: No    Review of Systems  Constitutional: Negative for fever. Eyes: Negative for visual changes. ENT: Negative for sore throat. Cardiovascular: Negative for chest pain. Respiratory: Negative for shortness of breath. Gastrointestinal: Negative for abdominal pain, vomiting and diarrhea. Genitourinary: positive for dysuria. Musculoskeletal: Negative for back pain. Skin: Negative for rash. Neurological: Negative for headaches, focal weakness or numbness.   10-point ROS otherwise negative.  ____________________________________________   PHYSICAL EXAM:  VITAL SIGNS: ED Triage Vitals  Enc Vitals Group     BP 01/16/15 2128 142/70 mmHg     Pulse Rate 01/16/15 2128 80     Resp 01/16/15 2128 18     Temp 01/16/15 2128 98.5 F (36.9 C)     Temp Source 01/16/15 2128 Oral     SpO2 01/16/15 2128 99 %     Weight 01/16/15 2128 185 lb (83.915 kg)     Height 01/16/15 2128 5\' 8"  (1.727 m)     Head Cir --      Peak Flow --      Pain Score --  Pain Loc --      Pain Edu? --      Excl. in GC? --      Constitutional: Alert and oriented. Well appearing and in no distress. Eyes: Conjunctivae are normal. PERRL. Normal extraocular movements. ENT   Head: Normocephalic and atraumatic.   Nose: No congestion/rhinnorhea.   Mouth/Throat: Mucous membranes are moist.   Neck: No stridor. Hematological/Lymphatic/Immunilogical: No cervical lymphadenopathy. Cardiovascular: Normal rate, regular rhythm. Normal and symmetric distal pulses are present in all extremities. No murmurs, rubs, or gallops. Respiratory: Normal respiratory effort without tachypnea nor retractions. Breath sounds are clear and equal bilaterally. No wheezes/rales/rhonchi. Gastrointestinal: Soft and nontender. No distention. There is no CVA  tenderness. Genitourinary: deferred Musculoskeletal: Nontender with normal range of motion in all extremities. No joint effusions.  No lower extremity tenderness nor edema. Neurologic:  Normal speech and language. No gross focal neurologic deficits are appreciated. Speech is normal.  Skin:  Skin is warm, dry and intact. No rash noted. Psychiatric: Mood and affect are normal. Speech and behavior are normal. Patient exhibits appropriate insight and judgment.  ____________________________________________    LABS (pertinent positives/negatives)  Labs Reviewed  URINALYSIS COMPLETEWITH MICROSCOPIC ([facility])  - Abnormal; Notable for the following:    Color, Urine RED (*)    APPearance CLOUDY (*)    Glucose, UA 50 (*)    Hgb urine dipstick 2+ (*)    Protein, ur 100 (*)    Leukocytes, UA 2+ (*)    Squamous Epithelial / LPF 0-5 (*)    All other components within normal limits     ____________________________________________     ____________________________________________   INITIAL IMPRESSION / ASSESSMENT AND PLAN / ED COURSE  Pertinent labs & imaging results that were available during my care of the patient were reviewed by me and considered in my medical decision making (see chart for details).  Given history and physical exam urine data consistent with urinary tract infection patient received ciprofloxacin 500 mg Pyridium 200 mg.  ____________________________________________   FINAL CLINICAL IMPRESSION(S) / ED DIAGNOSES  Final diagnoses:  UTI (urinary tract infection), bacterial      Darci Currentandolph N Talise Sligh, MD 01/17/15 [phone]

## 2015-01-20 ENCOUNTER — Telehealth (HOSPITAL_COMMUNITY): Payer: Self-pay | Admitting: Emergency Medicine

## 2017-02-23 ENCOUNTER — Encounter: Payer: Self-pay | Admitting: Emergency Medicine

## 2017-02-23 DIAGNOSIS — Z79899 Other long term (current) drug therapy: Secondary | ICD-10-CM | POA: Insufficient documentation

## 2017-02-23 DIAGNOSIS — O200 Threatened abortion: Secondary | ICD-10-CM | POA: Insufficient documentation

## 2017-02-23 LAB — HCG, QUANTITATIVE, PREGNANCY: hCG, Beta Chain, Quant, S: 1326 m[IU]/mL — ABNORMAL HIGH (ref <5)

## 2017-02-23 LAB — ABO/RH: ABO/RH(D): A POS

## 2017-02-23 NOTE — ED Triage Notes (Signed)
Patient reports brownish discharge yesterday that has been come red today.  Patient reports being [redacted] weeks pregnant.

## 2017-02-24 ENCOUNTER — Emergency Department
Admission: EM | Admit: 2017-02-24 | Discharge: 2017-02-24 | Disposition: A | Discharge status: Home / Self Care | Payer: Medicaid Other | Attending: Emergency Medicine | Admitting: Emergency Medicine

## 2017-02-24 ENCOUNTER — Emergency Department: Payer: Medicaid Other

## 2017-02-24 DIAGNOSIS — O200 Threatened abortion: Secondary | ICD-10-CM

## 2017-02-24 LAB — WET PREP, GENITAL
Clue Cells Wet Prep HPF POC: NONE SEEN
Sperm: NONE SEEN
Trich, Wet Prep: NONE SEEN
Yeast Wet Prep HPF POC: NONE SEEN

## 2017-02-24 LAB — CHLAMYDIA/NGC RT PCR (ARMC ONLY)
Chlamydia Tr: NOT DETECTED
N GONORRHOEAE: NOT DETECTED

## 2017-02-24 NOTE — Discharge Instructions (Signed)
Please follow up and have your blood work checked again in 2 days.

## 2017-02-24 NOTE — ED Notes (Signed)
Pt reports being approximately [redacted] weeks pregnant; has had some brown vaginal discharge that has changed to more red; c/o "mild" abd cramping; abd soft with BS x 4

## 2017-02-24 NOTE — ED Provider Notes (Signed)
[facility] Emergency Department Provider Note   ____________________________________________   None    (approximate)  I have reviewed the triage vital signs and the nursing notes.   HISTORY  Chief Complaint Vaginal Bleeding    HPI Kimberly Dunn is a 26 y.o. female who comes into the hospital today thinking that she may be having a miscarriage. The patient has had some mild cramping and bleeding. The patient is also had some fatigue and dizziness. She started to have some light spotting yesterday that was brownish pink. She said that today when she urinated and wiped she saw blood. She states that she is bleeding heavier now. The patient was told she was 10 weeks and 3 days on Tuesday. [facility]. The patient's last menstrual period was April 23. The patient rates her cramping pain out of 10 in intensity. The patient is also had some low back pain. The patient denies any nausea or vomiting and is here for evaluation.The patient is a G2 P1001   Past Medical History:  Diagnosis Date  . Abnormal Pap smear   . Anemia   . HPV (human papilloma virus) infection     Patient Active Problem List   Diagnosis Date Noted  . Fall from standing 11/23/2011  . Edema or excessive weight gain, antepartum 10/14/2011  . Anemia 10/09/2011  . Abnormal Pap smear and cervical HPV (human papillomavirus) 10/09/2011    Past Surgical History:  Procedure Laterality Date  . CHOLECYSTECTOMY    . CONDYLOMA EXCISION/FULGURATION    . TONSILLECTOMY      Prior to Admission medications   Medication Sig Start Date End Date Taking? Authorizing Provider  ciprofloxacin (CIPRO) 500 MG tablet Take 1 tablet (500 mg total) by mouth once. 01/17/15   Darci Current, MD  escitalopram (LEXAPRO) 10 MG tablet Take 1 tablet (10 mg total) by mouth daily. 04/01/13   Vale Haven, MD  ibuprofen (ADVIL,MOTRIN) 600 MG tablet Take 1 tablet (600 mg total) by mouth  every 6 (six) hours as needed for pain. 04/01/13   Vale Haven, MD  Multiple Vitamins-Calcium (ONE-A-DAY WOMENS PO) Take by mouth.    [provider]  norgestimate-ethinyl estradiol (ORTHO-CYCLEN,SPRINTEC,PREVIFEM) 0.25-35 MG-MCG tablet Take 1 tablet by mouth daily. 05/20/13   Willodean Rosenthal, MD  pantoprazole (PROTONIX) 40 MG tablet Take 1 tablet (40 mg total) by mouth daily. 09/24/12   Cathren Laine, MD  phenazopyridine (PYRIDIUM) 200 MG tablet Take 1 tablet (200 mg total) by mouth once. 01/17/15   Darci Current, MD    Allergies Patient has no known allergies.  Family History  Problem Relation Age of Onset  . Anesthesia problems Neg Hx   . Diabetes Mother   . Hypertension Father   . Hypertension Sister   . Anemia Sister   . Depression Sister     Social History Social History  Substance Use Topics  . Smoking status: Never Smoker  . Smokeless tobacco: Never Used  . Alcohol use No    Review of Systems  Constitutional: No fever/chills Eyes: No visual changes. ENT: No sore throat. Cardiovascular: Denies chest pain. Respiratory: Denies shortness of breath. Gastrointestinal:  abdominal pain.  No nausea, no vomiting.  No diarrhea.  No constipation. Genitourinary: Vaginal bleeding Musculoskeletal: Negative for back pain. Skin: Negative for rash. Neurological: Negative for headaches, focal weakness or numbness.   ____________________________________________   PHYSICAL EXAM:  VITAL SIGNS: ED Triage Vitals [02/23/17 2152]  Enc Vitals  Group     BP (!) 123/44     Pulse Rate 85     Resp 16     Temp 99.3 F (37.4 C)     Temp Source Oral     SpO2 100 %     Weight 190 lb (86.2 kg)     Height 5\' 8"  (1.727 m)     Head Circumference      Peak Flow      Pain Score      Pain Loc      Pain Edu?      Excl. in GC?     Constitutional: Alert and oriented. Well appearing and in Mild distress. Eyes: Conjunctivae are normal. PERRL. EOMI. Head:  Atraumatic. Nose: No congestion/rhinnorhea. Mouth/Throat: Mucous membranes are moist.  Oropharynx non-erythematous. Cardiovascular: Normal rate, regular rhythm. Grossly normal heart sounds.  Good peripheral circulation. Respiratory: Normal respiratory effort.  No retractions. Lungs CTAB. Gastrointestinal: Soft and nontender. No distention. Positive bowel sounds Genitourinary: Normal external genitalia, blood noticed in the vaginal vault with some membranous tissue. No cervical motion tenderness to palpation, no abdominal or adnexal tenderness to palpation. Musculoskeletal: No lower extremity tenderness nor edema.   Neurologic:  Normal speech and language.  Skin:  Skin is warm, dry and intact.  Psychiatric: Mood and affect are normal.   ____________________________________________   LABS (all labs ordered are listed, but only abnormal results are displayed)  Labs Reviewed  WET PREP, GENITAL - Abnormal; Notable for the following:       Result Value   WBC, Wet Prep HPF POC FEW (*)    All other components within normal limits  HCG, QUANTITATIVE, PREGNANCY - Abnormal; Notable for the following:    hCG, Beta Chain, Quant, S 1,326 (*)    All other components within normal limits  CHLAMYDIA/NGC RT PCR ([facility] ONLY)  ABO/RH   ____________________________________________  EKG  none ____________________________________________  RADIOLOGY  US Ob Comp Less 14 Wks  Result Date: 02/24/2017 CLINICAL DATA:  26 year old female with positive pregnancy test presenting with vaginal bleeding. EXAM: OBSTETRIC <14 WK [place] AND TRANSVAGINAL OB US TECHNIQUE: Both transabdominal and transvaginal ultrasound examinations were performed for complete evaluation of the gestation as well as the maternal uterus, adnexal regions, and pelvic cul-de-sac. Transvaginal technique was performed to assess early pregnancy. COMPARISON:  None. FINDINGS: Intrauterine gestational sac: None Yolk sac:  None Embryo:  None  Cardiac Activity: None Subchorionic hemorrhage:  N/A Maternal uterus/adnexae: The endometrium is thickened and heterogeneous measuring 1 cm in diameter. No abnormal endometrial vascularity and color images. The maternal ovaries appear unremarkable. The right ovary measures 3.1 x 1.6 x 1.7 cm and the left ovary measures 2.8 x 1.2 x 2.3 cm. There is a 0.8 x 2.9 x 1.0 cm left ovarian/paraovarian cyst. IMPRESSION: No intrauterine pregnancy identified and there are no pelvic/adnexal masses. Findings consistent with pregnancy of unknown location and differentials include: An early intrauterine pregnancy, recent spontaneous abortion, or ectopic pregnancy. Correlation with clinical exam and follow-up with serial HCG levels and ultrasound recommended. Electronically Signed   By: Elgie Collard M.D.   On: 02/24/2017 01:44   US Ob Transvaginal  Result Date: 02/24/2017 CLINICAL DATA:  26 year old female with positive pregnancy test presenting with vaginal bleeding. EXAM: OBSTETRIC <14 WK [place] AND TRANSVAGINAL OB US TECHNIQUE: Both transabdominal and transvaginal ultrasound examinations were performed for complete evaluation of the gestation as well as the maternal uterus, adnexal regions, and pelvic cul-de-sac. Transvaginal technique was performed to assess early  pregnancy. COMPARISON:  None. FINDINGS: Intrauterine gestational sac: None Yolk sac:  None Embryo:  None Cardiac Activity: None Subchorionic hemorrhage:  N/A Maternal uterus/adnexae: The endometrium is thickened and heterogeneous measuring 1 cm in diameter. No abnormal endometrial vascularity and color images. The maternal ovaries appear unremarkable. The right ovary measures 3.1 x 1.6 x 1.7 cm and the left ovary measures 2.8 x 1.2 x 2.3 cm. There is a 0.8 x 2.9 x 1.0 cm left ovarian/paraovarian cyst. IMPRESSION: No intrauterine pregnancy identified and there are no pelvic/adnexal masses. Findings consistent with pregnancy of unknown location and differentials  include: An early intrauterine pregnancy, recent spontaneous abortion, or ectopic pregnancy. Correlation with clinical exam and follow-up with serial HCG levels and ultrasound recommended. Electronically Signed   By: Elgie CollardArash  Radparvar M.D.   On: 02/24/2017 01:44    ____________________________________________   PROCEDURES  Procedure(s) performed: None  Procedures  Critical Care performed: No  ____________________________________________   INITIAL IMPRESSION / ASSESSMENT AND PLAN / ED COURSE  Pertinent labs & imaging results that were available during my care of the patient were reviewed by me and considered in my medical decision making (see chart for details).  This is a 26 year old female who comes into the hospital today with some vaginal bleeding. The patient thought she was [redacted] weeks pregnant. The patient's ultrasound does not show an intrauterine pregnancy nor does it show anything in the adnexa. The patient's quantitative beta hCG is 1326. This may be a threatened miscarriage or may be an early pregnancy. I discussed with the patient that she needs to follow up with OB for repeat Quant and ultrasound. Otherwise the patient has no further questions or concerns and she will be discharged to home. Her wet prep was negative.  Clinical Course as of Feb 25 319  Mon Feb 24, 2017  0151 No intrauterine pregnancy identified and there are no pelvic/adnexal masses. Findings consistent with pregnancy of unknown location and differentials include: An early intrauterine pregnancy, recent spontaneous abortion, or ectopic pregnancy. Correlation with clinical exam and follow-up with serial HCG levels and ultrasound recommended.   US OB Comp Less 14 Wks [AW]    Clinical Course User Index [AW] Rebecka ApleyWebster, Allison P, MD     ____________________________________________   FINAL CLINICAL IMPRESSION(S) / ED DIAGNOSES  Final diagnoses:  Threatened miscarriage      NEW MEDICATIONS STARTED  DURING THIS VISIT:  New Prescriptions   No medications on file     Note:  This document was prepared using Dragon voice recognition software and may include unintentional dictation errors.    Rebecka ApleyWebster, Allison P, MD 02/24/17 [phone]

## 2017-02-24 NOTE — ED Notes (Signed)
MD at bedside. 

## 2017-02-24 NOTE — ED Notes (Signed)
Dr Zenda AlpersWebster in with ED tech, yessica to perform pelvic exam

## 2017-02-25 ENCOUNTER — Ambulatory Visit (INDEPENDENT_AMBULATORY_CARE_PROVIDER_SITE_OTHER): Payer: Medicaid Other | Admitting: Obstetrics and Gynecology

## 2017-02-25 ENCOUNTER — Encounter: Payer: Self-pay | Admitting: Obstetrics and Gynecology

## 2017-02-25 ENCOUNTER — Telehealth: Payer: Self-pay | Admitting: Obstetrics and Gynecology

## 2017-02-25 VITALS — BP 106/62 | HR 66 | Ht 68.0 in | Wt 194.0 lb

## 2017-02-25 DIAGNOSIS — O3680X0 Pregnancy with inconclusive fetal viability, not applicable or unspecified: Secondary | ICD-10-CM

## 2017-02-25 DIAGNOSIS — Z3201 Encounter for pregnancy test, result positive: Secondary | ICD-10-CM

## 2017-02-25 DIAGNOSIS — Z3490 Encounter for supervision of normal pregnancy, unspecified, unspecified trimester: Secondary | ICD-10-CM

## 2017-02-25 NOTE — Telephone Encounter (Signed)
Please let me know when it is done.

## 2017-02-25 NOTE — Progress Notes (Signed)
Obstetrics & Gynecology Office Visit   Chief Complaint:  Chief Complaint  Patient presents with  . ER follow up    SAB/bleeding/ mild cramping    History of Present Illness: 26 yo G2P1001 presenting for follow up from ER visit for vaginal bleeding on 02/25/17 in setting of positive urine pregnancy test.  HCG was below the discriminatory zone.  Ultrasound showed endometrial thickening but no evidence of gestational sac.  Bleeding has stayed stable, does report passage of clots.  No significant cramping reported by patient.  No contributory medical problems, no history of prior miscarriage.   Review of Systems: 10 point review of systems negative unless otherwise noted by HPI  Past Medical History:  Past Medical History:  Diagnosis Date  . Abnormal Pap smear   . Anemia   . HPV (human papilloma virus) infection     Past Surgical History:  Past Surgical History:  Procedure Laterality Date  . CHOLECYSTECTOMY    . CONDYLOMA EXCISION/FULGURATION    . TONSILLECTOMY      Gynecologic History: Patient's last menstrual period was 12/30/2016 (exact date).  Obstetric History: G2P1001  Family History:  Family History  Problem Relation Age of Onset  . Diabetes Mother   . Hypertension Father   . Hypertension Sister   . Anemia Sister   . Depression Sister   . Anesthesia problems Neg Hx     Social History:  Social History   Social History  . Marital status: Single    Spouse name: N/A  . Number of children: N/A  . Years of education: N/A   Occupational History  . Not on file.   Social History Main Topics  . Smoking status: Never Smoker  . Smokeless tobacco: Never Used  . Alcohol use No  . Drug use: No  . Sexual activity: Yes    Birth control/ protection: None     Comment: desires Implanon   Other Topics Concern  . Not on file   Social History Narrative  . No narrative on file    Allergies:  No Known Allergies  Medications: Prior to Admission medications    Not on File    Physical Exam Vitals:  Vitals:   02/25/17 1132  BP: 106/62  Pulse: 66   Patient's last menstrual period was 12/30/2016 (exact date).  General: NAD HEENT: normocephalic, anicteric Pulmonary: No increased work of breathing Abdomen: NABS, soft, non-tender, non-distended.  Umbilicus without lesions.  No hepatomegaly, splenomegaly or masses palpable. No evidence of hernia  Genitourinary:  External: Normal external female genitalia.  Normal urethral meatus, normal  Bartholin's and Skene's glands.    Vagina: Normal vaginal mucosa, no evidence of prolapse, evidence of POC I posterior fourchette removed with ring forceps    Cervix: Grossly normal in appearance, moderate bleeding  Uterus: Non-enlarged, mobile, normal contour.  No CMT  Adnexa: ovaries non-enlarged, no adnexal masses  Rectal: deferred  Lymphatic: no evidence of inguinal lymphadenopathy Extremities: no edema, erythema, or tenderness Neurologic: Grossly intact Psychiatric: mood appropriate, affect full  Female chaperone present for pelvic and breast  portions of the physical exam  US Ob Comp Less 14 Wks  Result Date: 02/24/2017 CLINICAL DATA:  26 year old female with positive pregnancy test presenting with vaginal bleeding. EXAM: OBSTETRIC <14 WK [place] AND TRANSVAGINAL OB US TECHNIQUE: Both transabdominal and transvaginal ultrasound examinations were performed for complete evaluation of the gestation as well as the maternal uterus, adnexal regions, and pelvic cul-de-sac. Transvaginal technique was performed to assess  early pregnancy. COMPARISON:  None. FINDINGS: Intrauterine gestational sac: None Yolk sac:  None Embryo:  None Cardiac Activity: None Subchorionic hemorrhage:  N/A Maternal uterus/adnexae: The endometrium is thickened and heterogeneous measuring 1 cm in diameter. No abnormal endometrial vascularity and color images. The maternal ovaries appear unremarkable. The right ovary measures 3.1 x 1.6 x 1.7 cm and  the left ovary measures 2.8 x 1.2 x 2.3 cm. There is a 0.8 x 2.9 x 1.0 cm left ovarian/paraovarian cyst. IMPRESSION: No intrauterine pregnancy identified and there are no pelvic/adnexal masses. Findings consistent with pregnancy of unknown location and differentials include: An early intrauterine pregnancy, recent spontaneous abortion, or ectopic pregnancy. Correlation with clinical exam and follow-up with serial HCG levels and ultrasound recommended. Electronically Signed   By: Elgie CollardArash  Radparvar M.D.   On: 02/24/2017 01:44   [place] Ob Transvaginal  Result Date: 02/24/2017 CLINICAL DATA:  26 year old female with positive pregnancy test presenting with vaginal bleeding. EXAM: OBSTETRIC <14 WK US AND TRANSVAGINAL OB US TECHNIQUE: Both transabdominal and transvaginal ultrasound examinations were performed for complete evaluation of the gestation as well as the maternal uterus, adnexal regions, and pelvic cul-de-sac. Transvaginal technique was performed to assess early pregnancy. COMPARISON:  None. FINDINGS: Intrauterine gestational sac: None Yolk sac:  None Embryo:  None Cardiac Activity: None Subchorionic hemorrhage:  N/A Maternal uterus/adnexae: The endometrium is thickened and heterogeneous measuring 1 cm in diameter. No abnormal endometrial vascularity and color images. The maternal ovaries appear unremarkable. The right ovary measures 3.1 x 1.6 x 1.7 cm and the left ovary measures 2.8 x 1.2 x 2.3 cm. There is a 0.8 x 2.9 x 1.0 cm left ovarian/paraovarian cyst. IMPRESSION: No intrauterine pregnancy identified and there are no pelvic/adnexal masses. Findings consistent with pregnancy of unknown location and differentials include: An early intrauterine pregnancy, recent spontaneous abortion, or ectopic pregnancy. Correlation with clinical exam and follow-up with serial HCG levels and ultrasound recommended. Electronically Signed   By: Elgie CollardArash  Radparvar M.D.   On: 02/24/2017 01:44    Assessment: 26 y.o. G2P1001 No  problem-specific Assessment & Plan notes found for this encounter.   Plan: Problem List Items Addressed This Visit    None       The patient does not have identifiable risk factors which would increase her risk of ectopic pregnancy (Approximately 50% of all patient presenting with ectopic pregnancy will have identifiable risk factors such as prior GC/CT, PID, or ART).  The discriminatory zone for visualizing a singleton intrauterine pregnancy is an HCG level of approximately 154800mIU/mL to 258500mIU/mL, although the recommended value to rule out an intrauterine pregnancy has been set conservatively high at 358400mIU/mL to account for twin gestations which occur at a rate of approximately 2% of all spontaneous conceptions.  Serial HCG measurements are more helpful in determining the patients risk of ectopic than a single value.  Demonstration of adnexal mass with a hypoechoic center on ultrasound has a positive predictive value of approximately 80% and may misidentify corpus luteum cysts, paratubal cysts, and or hydrosalpinx among others.  An intrauterine gestational sac makes ectopic pregnancy less likely but may represent pseudosac.  The diagnosis of intrauterine pregnancy requires documentation of an intrauterine gestational sac with accompanying yolk sac or fetal pole.  Heterotopic pregnancy is exceedingly rare but may still be present in cases of confirmed intrauterine pregnancy with a reported incidence of 1 in 4,000 to 1 in 30,000.   Ruptured ectopic pregnancy continued to account for 2.7% of all maternal deaths, and the  incidence of ectopic pregnancy is reported at approximately 2% of all spontaneous conceptions.  The patient understand the importance of returning for repeat HCG measurement in 48-hrs, as well as follow up ultrasound.  She is currently hemodynamically stable, with a non-acute pelvic and abdominal exam, and reliable.  This is a desired pregnancy.  She was given strict precautions should  she develop acute abdominal pain and or orthostatic symptoms.  ACOG Practice Bulletin 191 February 2018 "Ectopic Pregnancy"  - HCG 02/23/17 21:53 was 1326 repeat today - A pos rhogam not indicated - repeat ultrasound in 1 week, likley POC removed today.  If completed abortion discuss contraceptive options.  Initially desired pregnancy but FOB unsure if he is ready

## 2017-02-25 NOTE — Telephone Encounter (Signed)
done

## 2017-02-25 NOTE — Telephone Encounter (Signed)
Pt is calling needing an work note from todays appointment. Please call patient.

## 2017-02-25 NOTE — Telephone Encounter (Signed)
Pt aware note is ready for pick up at front desk

## 2017-02-26 ENCOUNTER — Encounter: Payer: Self-pay | Admitting: Obstetrics and Gynecology

## 2017-02-27 ENCOUNTER — Telehealth: Payer: Self-pay

## 2017-02-27 LAB — PATHOLOGY

## 2017-02-27 NOTE — Telephone Encounter (Signed)
I responded through patient portal. KJ CMA

## 2017-02-27 NOTE — Telephone Encounter (Signed)
Pt calling triage wanting to get an update on her lab results from her last visit with [facility]. fwding to [facility] nurse.

## 2017-03-03 ENCOUNTER — Encounter: Payer: Self-pay | Admitting: Obstetrics and Gynecology

## 2017-03-04 ENCOUNTER — Other Ambulatory Visit: Payer: Self-pay | Admitting: Obstetrics and Gynecology

## 2017-03-04 MED ORDER — NORGESTIMATE-ETH ESTRADIOL 0.25-35 MG-MCG PO TABS: 1.0000 | ORAL_TABLET | Freq: Every day | ORAL | 11 refills | Status: DC

## 2017-07-24 ENCOUNTER — Emergency Department
Admission: EM | Admit: 2017-07-24 | Discharge: 2017-07-24 | Disposition: A | Discharge status: Home / Self Care | Payer: Medicaid Other | Attending: Emergency Medicine | Admitting: Emergency Medicine

## 2017-07-24 ENCOUNTER — Emergency Department: Payer: Medicaid Other

## 2017-07-24 DIAGNOSIS — R109 Unspecified abdominal pain: Secondary | ICD-10-CM

## 2017-07-24 DIAGNOSIS — O26891 Other specified pregnancy related conditions, first trimester: Secondary | ICD-10-CM

## 2017-07-24 DIAGNOSIS — R1030 Lower abdominal pain, unspecified: Secondary | ICD-10-CM | POA: Insufficient documentation

## 2017-07-24 DIAGNOSIS — N9489 Other specified conditions associated with female genital organs and menstrual cycle: Secondary | ICD-10-CM | POA: Insufficient documentation

## 2017-07-24 DIAGNOSIS — B3731 Acute candidiasis of vulva and vagina: Secondary | ICD-10-CM

## 2017-07-24 DIAGNOSIS — Z3A12 12 weeks gestation of pregnancy: Secondary | ICD-10-CM | POA: Insufficient documentation

## 2017-07-24 DIAGNOSIS — O9989 Other specified diseases and conditions complicating pregnancy, childbirth and the puerperium: Secondary | ICD-10-CM | POA: Diagnosis present

## 2017-07-24 DIAGNOSIS — N760 Acute vaginitis: Secondary | ICD-10-CM

## 2017-07-24 DIAGNOSIS — O23591 Infection of other part of genital tract in pregnancy, first trimester: Secondary | ICD-10-CM | POA: Insufficient documentation

## 2017-07-24 DIAGNOSIS — B373 Candidiasis of vulva and vagina: Secondary | ICD-10-CM | POA: Insufficient documentation

## 2017-07-24 DIAGNOSIS — B9689 Other specified bacterial agents as the cause of diseases classified elsewhere: Secondary | ICD-10-CM

## 2017-07-24 LAB — CHLAMYDIA/NGC RT PCR (ARMC ONLY)
Chlamydia Tr: NOT DETECTED
N gonorrhoeae: NOT DETECTED

## 2017-07-24 LAB — WET PREP, GENITAL
SPERM: NONE SEEN
Trich, Wet Prep: NONE SEEN
Yeast Wet Prep HPF POC: NONE SEEN

## 2017-07-24 LAB — URINALYSIS, COMPLETE (UACMP) WITH MICROSCOPIC
Bilirubin Urine: NEGATIVE
GLUCOSE, UA: NEGATIVE mg/dL
Hgb urine dipstick: NEGATIVE
Ketones, ur: NEGATIVE mg/dL
Leukocytes, UA: NEGATIVE
Nitrite: NEGATIVE
PROTEIN: NEGATIVE mg/dL
SPECIFIC GRAVITY, URINE: 1.031 — AB (ref 1.005–1.030)
pH: 5 (ref 5.0–8.0)

## 2017-07-24 LAB — HCG, QUANTITATIVE, PREGNANCY: HCG, BETA CHAIN, QUANT, S: 71423 m[IU]/mL — AB (ref <5)

## 2017-07-24 MED ORDER — HYDROCODONE-ACETAMINOPHEN 5-325 MG PO TABS
1.0000 | ORAL_TABLET | Freq: Once | ORAL | Status: AC
  Administered 2017-07-24: 1 via ORAL
  Filled 2017-07-24: qty 1

## 2017-07-24 MED ORDER — METRONIDAZOLE 500 MG PO TABS: 500.0000 mg | ORAL_TABLET | Freq: Two times a day (BID) | ORAL | 0 refills | Status: AC

## 2017-07-24 MED ORDER — FLUCONAZOLE 50 MG PO TABS
150.0000 mg | ORAL_TABLET | Freq: Once | ORAL | Status: AC
  Administered 2017-07-24: 150 mg via ORAL
  Filled 2017-07-24: qty 3

## 2017-07-24 NOTE — ED Notes (Signed)
FIRST NURSE NOTE: Pt ambulatory to desk. Pt alert and oriented X4, active, cooperative, pt in NAD. RR even and unlabored, color WNL.

## 2017-07-24 NOTE — ED Provider Notes (Signed)
[facility] Emergency Department Provider Note  ____________________________________________   First MD Initiated Contact with Patient 07/24/17 1959     (approximate)  I have reviewed the triage vital signs and the nursing notes.   HISTORY  Chief Complaint Abdominal Pain (pt approx [redacted] weeks pregnant)    HPI Kimberly Dunn is a 26 y.o. female who self presents to the emergency department with sudden onset lower abdominal pain that began this morning.  The pain has been constant and aching ever since.  It seems to be somewhat worse with movement somewhat improved with rest.  [redacted] weeks pregnant.  She did not use fertility medications.  She is yet to receive prenatal care and has not had an ultrasound  Past Medical History:  Diagnosis Date  . Abnormal Pap smear   . Anemia   . HPV (human papilloma virus) infection     Patient Active Problem List   Diagnosis Date Noted  . Fall from standing 11/23/2011  . Edema or excessive weight gain, antepartum 10/14/2011  . Anemia 10/09/2011  . Abnormal Pap smear and cervical HPV (human papillomavirus) 10/09/2011    Past Surgical History:  Procedure Laterality Date  . CHOLECYSTECTOMY    . CONDYLOMA EXCISION/FULGURATION    . TONSILLECTOMY      Prior to Admission medications   Medication Sig Start Date End Date Taking? Authorizing Provider  metroNIDAZOLE (FLAGYL) 500 MG tablet Take 1 tablet (500 mg total) 2 (two) times daily for 7 days by mouth. 07/24/17 07/31/17  Merrily Brittleifenbark, Darril Patriarca, MD  norgestimate-ethinyl estradiol (ORTHO-CYCLEN,SPRINTEC,PREVIFEM) 0.25-35 MG-MCG tablet Take 1 tablet by mouth daily. 03/04/17   Vena AustriaStaebler, Andreas, MD    Allergies Patient has no known allergies.  Family History  Problem Relation Age of Onset  . Diabetes Mother   . Hypertension Father   . Hypertension Sister   . Anemia Sister   . Depression Sister   . Anesthesia problems Neg Hx     Social History Social History    Tobacco Use  . Smoking status: Never Smoker  . Smokeless tobacco: Never Used  Substance Use Topics  . Alcohol use: No  . Drug use: No    Review of Systems Constitutional: No fever/chills Eyes: No visual changes. ENT: No sore throat. Cardiovascular: Denies chest pain. Respiratory: Denies shortness of breath. Gastrointestinal: Positive for abdominal pain.  No nausea, no vomiting.  No diarrhea.  No constipation. Genitourinary: Negative for dysuria. Musculoskeletal: Negative for back pain. Skin: Negative for rash. Neurological: Negative for headaches, focal weakness or numbness.   ____________________________________________   PHYSICAL EXAM:  VITAL SIGNS: ED Triage Vitals [07/24/17 1847]  Enc Vitals Group     BP 110/70     Pulse Rate 84     Resp 18     Temp 98.6 F (37 C)     Temp Source Oral     SpO2 100 %     Weight 194 lb (88 kg)     Height      Head Circumference      Peak Flow      Pain Score 6     Pain Loc      Pain Edu?      Excl. in GC?     Constitutional: Alert and oriented x4 well-appearing nontoxic no diaphoresis speaks full clear sentences Eyes: PERRL EOMI. Head: Atraumatic. Nose: No congestion/rhinnorhea. Mouth/Throat: No trismus Neck: No stridor.   Cardiovascular: Normal rate, regular rhythm. Grossly normal heart sounds.  Good peripheral circulation. Respiratory:  Normal respiratory effort.  No retractions. Lungs CTAB and moving good air Gastrointestinal: Soft nondistended mild lower abdominal tenderness without focality no rebound or guarding no peritonitis no McBurney's tenderness 6 no costovertebral tenderness Pelvic exam chaperoned by female nurse Lorrie: Normal external exam.  Also closed.  Moderate amount of green discharge in the vault.  No cervical motion tenderness no adnexal tenderness or uterine tenderness.  The patient also had thick cheesy discharge in the vault Musculoskeletal: No lower extremity edema   Neurologic:  Normal speech  and language. No gross focal neurologic deficits are appreciated. Skin:  Skin is warm, dry and intact. No rash noted. Psychiatric: Mood and affect are normal. Speech and behavior are normal.    ____________________________________________   DIFFERENTIAL includes but not limited to  Ectopic pregnancy, appendicitis, pelvic inflammatory disease, ovarian torsion, ovarian cyst ____________________________________________   LABS (all labs ordered are listed, but only abnormal results are displayed)  Labs Reviewed  WET PREP, GENITAL - Abnormal; Notable for the following components:      Result Value   Clue Cells Wet Prep HPF POC PRESENT (*)    WBC, Wet Prep HPF POC MANY (*)    All other components within normal limits  HCG, QUANTITATIVE, PREGNANCY - Abnormal; Notable for the following components:   hCG, Beta Chain, Quant, S 71,423 (*)    All other components within normal limits  URINALYSIS, COMPLETE (UACMP) WITH MICROSCOPIC - Abnormal; Notable for the following components:   Color, Urine YELLOW (*)    APPearance CLEAR (*)    Specific Gravity, Urine 1.031 (*)    Bacteria, UA RARE (*)    Squamous Epithelial / LPF 0-5 (*)    All other components within normal limits  CHLAMYDIA/NGC RT PCR ([facility] ONLY)    Blood work reviewed and interpreted by me shows clue cells which are concerning for bacterial vaginosis __________________________________________  EKG   ____________________________________________  RADIOLOGY  Ultrasound reviewed by me shows single intrauterine pregnancy at 12 weeks with a normal heartbeat ____________________________________________   PROCEDURES  Procedure(s) performed: no  Procedures  Critical Care performed: no  Observation: no ____________________________________________   INITIAL IMPRESSION / ASSESSMENT AND PLAN / ED COURSE  Pertinent labs & imaging results that were available during my care of the patient were reviewed by me and  considered in my medical decision making (see chart for details).  ----------------------------------------- 8:05 PM on 07/24/2017 -----------------------------------------  The patient arrives very well-appearing with a benign abdominal exam, however has not had an ultrasound and we do not know that pain medications for now and ultrasound are pending.  He very well may require a pelvic exam after the ultrasound.    ----------------------------------------- 9:27 PM on 07/24/2017 -----------------------------------------  Pelvic exam with copious amounts of thin green discharge along with yeast yeast infection.  GC CT have been sent. ____________________________________________  Patient has been treated with fluconazole for her yeast infection and will be given Flagyl for bacterial vaginosis at home.  Strict return precautions have been given as well as a 2-day follow-up with OB gynecology.  The patient verbalized understanding and agree with the plan.  FINAL CLINICAL IMPRESSION(S) / ED DIAGNOSES  Final diagnoses:  Candidal vulvovaginitis  Bacterial vaginosis  Abdominal pain during pregnancy in first trimester      NEW MEDICATIONS STARTED DURING THIS VISIT:  This SmartLink is deprecated. Use AVSMEDLIST instead to display the medication list for a patient.   Note:  This document was prepared using Conservation officer, historic buildingsDragon voice recognition software and may  include unintentional dictation errors.     Merrily Brittle, MD 07/25/17 1441

## 2017-07-24 NOTE — ED Triage Notes (Signed)
Pt states she is nauseated and having pressure in her lower abdomen.  Pt states she is approx [redacted] weeks pregnant.  Pt states that she had a miscarriage prior to this pregnancy with no symptoms.  Pt denies bleeding at this time.  Pt is A&Ox4, in NAD, ambulatory to triage.

## 2017-07-24 NOTE — Discharge Instructions (Signed)
Please take all of your antibiotics as prescribed and make an appointment to [facility] in 2-3 days.  Return to the emergency department immediately for any new or worsening symptoms such as fevers, chills, worsening pain, if you cannot eat or drink, or for any other concerns whatsoever.  It was a pleasure to take care of you today, and thank you for coming to our emergency department.  If you have any questions or concerns before leaving please ask the nurse to grab me and I'm more than happy to go through your aftercare instructions again.  If you were prescribed any opioid pain medication today such as Norco, Vicodin, Percocet, morphine, hydrocodone, or oxycodone please make sure you do not drive when you are taking this medication as it can alter your ability to drive safely.  If you have any concerns once you are home that you are not improving or are in fact getting worse before you can make it to your follow-up appointment, please do not hesitate to call 911 and come back for further evaluation.  Merrily BrittleNeil Aprel Egelhoff, MD  Results for orders placed or performed during the hospital encounter of 07/24/17  Wet prep, genital  Result Value Ref Range   Yeast Wet Prep HPF POC NONE SEEN NONE SEEN   Trich, Wet Prep NONE SEEN NONE SEEN   Clue Cells Wet Prep HPF POC PRESENT (A) NONE SEEN   WBC, Wet Prep HPF POC MANY (A) NONE SEEN   Sperm NONE SEEN   hCG, quantitative, pregnancy  Result Value Ref Range   hCG, Beta Chain, Quant, S 71,423 (H) <5 mIU/mL  Urinalysis, Complete w Microscopic  Result Value Ref Range   Color, Urine YELLOW (A) YELLOW   APPearance CLEAR (A) CLEAR   Specific Gravity, Urine 1.031 (H) 1.005 - 1.030   pH 5.0 5.0 - 8.0   Glucose, UA NEGATIVE NEGATIVE mg/dL   Hgb urine dipstick NEGATIVE NEGATIVE   Bilirubin Urine NEGATIVE NEGATIVE   Ketones, ur NEGATIVE NEGATIVE mg/dL   Protein, ur NEGATIVE NEGATIVE mg/dL   Nitrite NEGATIVE NEGATIVE   Leukocytes, UA NEGATIVE  NEGATIVE   RBC / HPF 0-5 0 - 5 RBC/hpf   WBC, UA 0-5 0 - 5 WBC/hpf   Bacteria, UA RARE (A) NONE SEEN   Squamous Epithelial / LPF 0-5 (A) NONE SEEN   Mucus PRESENT    [place] Ob Comp < 14 Wks  Result Date: 07/24/2017 CLINICAL DATA:  Pelvic pain for 12 weeks. Gestational age by last menstrual period 12 weeks and 3 days. Beta HCG 71,423. EXAM: OBSTETRIC <14 WK ULTRASOUND TECHNIQUE: Transabdominal ultrasound was performed for evaluation of the gestation as well as the maternal uterus and adnexal regions. COMPARISON:  Obstetric ultrasound February 14, 2017 FINDINGS: Intrauterine gestational sac: Present Yolk sac:  Not present Embryo:  Present Cardiac Activity: Present Heart Rate: 162 bpm CRL:   59  mm   12 w 3 d                  [facility] Subchorionic hemorrhage:  None visualized. Maternal uterus/adnexae: Normal appearance the adnexae. No free fluid. Placenta forming posteriorly. IMPRESSION: 1. Single live intrauterine pregnancy, gestational age by ultrasound 12 weeks and 3 days, concordant dates. No immediate complication. Electronically Signed   By: Awilda Metroourtnay  Bloomer M.D.   On: 07/24/2017 20:49

## 2017-08-27 ENCOUNTER — Other Ambulatory Visit: Payer: Self-pay | Admitting: Obstetrics and Gynecology

## 2017-08-27 ENCOUNTER — Ambulatory Visit (INDEPENDENT_AMBULATORY_CARE_PROVIDER_SITE_OTHER): Payer: Self-pay | Admitting: Obstetrics and Gynecology

## 2017-08-27 ENCOUNTER — Encounter: Payer: Self-pay | Admitting: Obstetrics and Gynecology

## 2017-08-27 VITALS — BP 105/68 | HR 82 | Wt 198.4 lb

## 2017-08-27 DIAGNOSIS — O0932 Supervision of pregnancy with insufficient antenatal care, second trimester: Secondary | ICD-10-CM

## 2017-08-27 NOTE — Progress Notes (Signed)
HPI:      Ms. Kimberly Dunn is a 26 y.o. G3P1011 who LMP was Patient's last menstrual period was 04/28/2017.  Subjective:   She presents today for her first prenatal visit.  She is approximately 17 weeks estimated gestational age based on ultrasound.  She has not received prenatal care to this point because she states she was "in denial" about this pregnancy. Patient was weight training and was taking clenbuterol at about the time of conception and an early first trimester.  She had some concerns regarding this.  She has discontinued this medication. Her last pregnancy was complicated by large outbreak of external condyloma which were removed in the operating room during the pregnancy.  These have not reoccurred. She has occasional nausea and vomiting but this is improving slowly. She is currently taking prenatal gummy vitamins.    Hx: The following portions of the patient's history were reviewed and updated as appropriate:             She  has a past medical history of Abnormal Pap smear, Anemia, and HPV (human papilloma virus) infection. She does not have any pertinent problems on file. She  has a past surgical history that includes Tonsillectomy; Cholecystectomy; and Condyloma excision/fulguration. Her family history includes Anemia in her sister; Depression in her sister; Diabetes in her mother; Hypertension in her father and sister. She  reports that  has never smoked. she has never used smokeless tobacco. She reports that she does not drink alcohol or use drugs. She has No Known Allergies.       Review of Systems:  Review of Systems  Constitutional: Denied constitutional symptoms, night sweats, recent illness, fatigue, fever, insomnia and weight loss.  Eyes: Denied eye symptoms, eye pain, photophobia, vision change and visual disturbance.  Ears/Nose/Throat/Neck: Denied ear, nose, throat or neck symptoms, hearing loss, nasal discharge, sinus congestion and sore throat.   Cardiovascular: Denied cardiovascular symptoms, arrhythmia, chest pain/pressure, edema, exercise intolerance, orthopnea and palpitations.  Respiratory: Denied pulmonary symptoms, asthma, pleuritic pain, productive sputum, cough, dyspnea and wheezing.  Gastrointestinal: Denied, gastro-esophageal reflux, melena, nausea and vomiting.  Genitourinary: Denied genitourinary symptoms including symptomatic vaginal discharge, pelvic relaxation issues, and urinary complaints.  Musculoskeletal: Denied musculoskeletal symptoms, stiffness, swelling, muscle weakness and myalgia.  Dermatologic: Denied dermatology symptoms, rash and scar.  Neurologic: Denied neurology symptoms, dizziness, headache, neck pain and syncope.  Psychiatric: Denied psychiatric symptoms, anxiety and depression.  Endocrine: Denied endocrine symptoms including hot flashes and night sweats.   Meds:   Current Outpatient Medications on File Prior to Visit  Medication Sig Dispense Refill  . Prenatal Vit-Fe Fumarate-FA (PRENATAL MULTIVITAMIN) TABS tablet Take 1 tablet by mouth daily at 12 noon.    . [DISCONTINUED] sodium chloride (OCEAN NASAL SPRAY) 0.65 % nasal spray Place 1 spray into the nose as needed for congestion. 45 mL 3   No current facility-administered medications on file prior to visit.     Objective:     Vitals:   08/27/17 1145  BP: 105/68  Pulse: 82              Physical examination General NAD, Conversant  HEENT Atraumatic; Op clear with mmm.  Normo-cephalic. Pupils reactive. Anicteric sclerae  Thyroid/Neck Smooth without nodularity or enlargement. Normal ROM.  Neck Supple.  Skin No rashes, lesions or ulceration. Normal palpated skin turgor. No nodularity.  Breasts: No masses or discharge.  Symmetric.  No axillary adenopathy.  Lungs: Clear to auscultation.No rales or wheezes. Normal Respiratory effort, no  retractions.  Heart: NSR.  No murmurs or rubs appreciated. No periferal edema  Abdomen: Soft.  Non-tender.   No masses.  No HSM. No hernia  Extremities: Moves all appropriately.  Normal ROM for age. No lymphadenopathy.  Neuro: Oriented to PPT.  Normal mood. Normal affect.     Pelvic:   Vulva: Normal appearance.  No lesions.  Vagina: No lesions or abnormalities noted.  Support: Normal pelvic support.  Urethra No masses tenderness or scarring.  Meatus Normal size without lesions or prolapse.  Cervix: Normal appearance.  No lesions.  Anus: Normal exam.  No lesions.  Perineum: Normal exam.  No lesions.        Bimanual   Adnexae: No masses.  Non-tender to palpation.  Uterus: Enlarged. 18wks  POS FHTs  Non-tender.  Mobile.  AV.  Adnexae: No masses.  Non-tender to palpation.  Cul-de-sac: Negative for abnormality.  Adnexae: No masses.  Non-tender to palpation.         Pelvimetry   Diagonal: Reached.  Spines: Average.  Sacrum: Concave.  Pubic Arch: Normal.      Assessment:    G3P1011 Patient Active Problem List   Diagnosis Date Noted  . Fall from standing 11/23/2011  . Edema or excessive weight gain, antepartum 10/14/2011  . Anemia 10/09/2011  . Abnormal Pap smear and cervical HPV (human papillomavirus) 10/09/2011     1. Insufficient prenatal care in second trimester        Plan:            Prenatal Plan 1.  The patient was given prenatal literature. 2.  She was continued on prenatal vitamins. 3.  A prenatal lab panel was ordered or drawn. 4.  An ultrasound was ordered in 3 weeks for a fetal anatomic survey. 5.  A nurse visit was scheduled. 6.  Genetic testing and testing for other inheritable conditions discussed in detail. She  has decided to have AFP and MaterniT 21 performed today.   7.  A general overview of pregnancy testing, visit schedule, ultrasound schedule, and prenatal care was discussed.   Orders2 No orders of the defined types were placed in this encounter.   No orders of the defined types were placed in this encounter.     F/U  Return in about 3 weeks  (around 09/17/2017). I spent 33 minutes with this patient of which greater than 50% was spent discussing the above prenatal plan, genetic testing, spina bifida testing, general overview of pregnancy, HPV and pregnancy, clenbuterol use.  Elonda Huskyavid J. Evans, M.D. 08/27/2017 12:33 PM

## 2017-08-27 NOTE — Progress Notes (Signed)
NOB: Patient states she was "in denial" about this pregnancy and this is her first prenatal visit.

## 2017-08-28 LAB — URINALYSIS, ROUTINE W REFLEX MICROSCOPIC
Bilirubin, UA: NEGATIVE
Glucose, UA: NEGATIVE
KETONES UA: NEGATIVE
LEUKOCYTES UA: NEGATIVE
Nitrite, UA: NEGATIVE
PH UA: 7 (ref 5.0–7.5)
Protein, UA: NEGATIVE
RBC, UA: NEGATIVE
SPEC GRAV UA: 1.017 (ref 1.005–1.030)
Urobilinogen, Ur: 0.2 mg/dL (ref 0.2–1.0)

## 2017-08-29 ENCOUNTER — Telehealth: Payer: Self-pay

## 2017-08-29 LAB — CBC WITH DIFFERENTIAL
Basophils Absolute: 0 10*3/uL (ref 0.0–0.2)
Basos: 0 %
EOS (ABSOLUTE): 0 10*3/uL (ref 0.0–0.4)
Eos: 0 %
HEMOGLOBIN: 11.8 g/dL (ref 11.1–15.9)
Hematocrit: 35 % (ref 34.0–46.6)
Immature Grans (Abs): 0 10*3/uL (ref 0.0–0.1)
Immature Granulocytes: 0 %
LYMPHS ABS: 1.7 10*3/uL (ref 0.7–3.1)
Lymphs: 24 %
MCH: 30.3 pg (ref 26.6–33.0)
MCHC: 33.7 g/dL (ref 31.5–35.7)
MCV: 90 fL (ref 79–97)
MONOCYTES: 4 %
Monocytes Absolute: 0.3 10*3/uL (ref 0.1–0.9)
NEUTROS PCT: 72 %
Neutrophils Absolute: 5 10*3/uL (ref 1.4–7.0)
RBC: 3.9 x10E6/uL (ref 3.77–5.28)
RDW: 13.1 % (ref 12.3–15.4)
WBC: 7.1 10*3/uL (ref 3.4–10.8)

## 2017-08-29 LAB — RPR, QUANT+TP ABS (REFLEX): TREPONEMA PALLIDUM AB: POSITIVE — AB

## 2017-08-29 LAB — VARICELLA ZOSTER ANTIBODY, IGG: Varicella zoster IgG: 1589 index (ref >165)

## 2017-08-29 LAB — HEPATITIS B SURFACE ANTIGEN: Hepatitis B Surface Ag: NEGATIVE

## 2017-08-29 LAB — MONITOR DRUG PROFILE 14(MW)
Amphetamine Scrn, Ur: NEGATIVE ng/mL
BARBITURATE SCREEN URINE: NEGATIVE ng/mL
BENZODIAZEPINE SCREEN, URINE: NEGATIVE ng/mL
Buprenorphine, Urine: NEGATIVE ng/mL
CANNABINOIDS UR QL SCN: NEGATIVE ng/mL
Cocaine (Metab) Scrn, Ur: NEGATIVE ng/mL
Creatinine(Crt), U: 103.3 mg/dL (ref 20.0–300.0)
Fentanyl, Urine: NEGATIVE pg/mL
METHADONE SCREEN, URINE: NEGATIVE ng/mL
Meperidine Screen, Urine: NEGATIVE ng/mL
OXYCODONE+OXYMORPHONE UR QL SCN: NEGATIVE ng/mL
Opiate Scrn, Ur: NEGATIVE ng/mL
PH UR, DRUG SCRN: 6.7 (ref 4.5–8.9)
PHENCYCLIDINE QUANTITATIVE URINE: NEGATIVE ng/mL
Propoxyphene Scrn, Ur: NEGATIVE ng/mL
SPECIFIC GRAVITY: 1.021
Tramadol Screen, Urine: NEGATIVE ng/mL

## 2017-08-29 LAB — GC/CHLAMYDIA PROBE AMP
Chlamydia trachomatis, NAA: NEGATIVE
Neisseria gonorrhoeae by PCR: NEGATIVE

## 2017-08-29 LAB — RUBELLA SCREEN: Rubella Antibodies, IGG: 2.51 index (ref >0.99)

## 2017-08-29 LAB — URINE CULTURE

## 2017-08-29 LAB — ANTIBODY SCREEN: Antibody Screen: NEGATIVE

## 2017-08-29 LAB — HIV ANTIBODY (ROUTINE TESTING W REFLEX): HIV SCREEN 4TH GENERATION: NONREACTIVE

## 2017-08-29 LAB — RPR: RPR: REACTIVE — AB

## 2017-08-29 LAB — ABO AND RH: Rh Factor: POSITIVE

## 2017-08-29 LAB — NICOTINE SCREEN, URINE: COTININE UR QL SCN: NEGATIVE ng/mL

## 2017-08-29 NOTE — Telephone Encounter (Signed)
[facility] Pt had a reactive RPR. He was informed the test results have not been reviewed by the provider yet. All his questions were answered. Offered to return his call once they were reviewed but he stated he will contact us.

## 2017-08-31 LAB — PAP IG, CT-NG, RFX HPV ASCU
Chlamydia, Nuc. Acid Amp: NEGATIVE
GONOCOCCUS BY NUCLEIC ACID AMP: NEGATIVE
PAP SMEAR COMMENT: 0

## 2017-08-31 LAB — AFP, SERUM, OPEN SPINA BIFIDA
AFP MOM: 0.65
AFP Value: 20.2 ng/mL
Gest. Age on Collection Date: 17 weeks
Maternal Age At EDD: 27.4 yr
OSBR Risk 1 IN: 10000
Test Results:: NEGATIVE
Weight: 198 [lb_av]

## 2017-09-01 LAB — MATERNIT 21 PLUS CORE, BLOOD
CHROMOSOME 13: NEGATIVE
CHROMOSOME 18: NEGATIVE
CHROMOSOME 21: NEGATIVE
Y Chromosome: NOT DETECTED

## 2017-09-04 ENCOUNTER — Other Ambulatory Visit: Payer: Self-pay | Admitting: Obstetrics and Gynecology

## 2017-09-04 DIAGNOSIS — Z369 Encounter for antenatal screening, unspecified: Secondary | ICD-10-CM

## 2017-09-09 NOTE — L&D Delivery Note (Signed)
       Delivery Note   [name] is a 28 y.o. G3P1011 at [redacted]w[redacted]d Estimated Date of Delivery: 02/04/18  PRE-OPERATIVE DIAGNOSIS:  1) [redacted]w[redacted]d pregnancy.   POST-OPERATIVE DIAGNOSIS:  1) [redacted]w[redacted]d pregnancy s/p Vaginal, Spontaneous   Delivery Type: Vaginal, Spontaneous    Delivery Anesthesia: None   Labor Complications:       ESTIMATED BLOOD LOSS: 100  ml    FINDINGS:   1) female infant, Apgar scores of 9    at 1 minute and 9    at 5 minutes and a birthweight of 126.98  ounces.    2) Nuchal cord: No  SPECIMENS:   PLACENTA:   Appearance: Intact    Removal: Spontaneous      Disposition:     DISPOSITION:  Infant to left in stable condition in the delivery room, with L&D personnel and mother,  NARRATIVE SUMMARY: Labor course:  [name] is a G3P1011 at [redacted]w[redacted]d who presented for labor management.  She progressed well in labor with pitocin.  She received the appropriate anesthesia and proceeded to complete dilation. She evidenced good maternal expulsive effort during the second stage. She went on to deliver a viable infant. The placenta delivered without problems and was noted to be complete. A perineal and vaginal examination was performed. Episiotomy/Lacerations: None    Elonda Husky, M.D. 02/02/2018 10:51 PM

## 2017-09-10 ENCOUNTER — Encounter: Payer: Self-pay | Admitting: Obstetrics and Gynecology

## 2017-09-12 ENCOUNTER — Encounter: Payer: Self-pay | Admitting: Obstetrics and Gynecology

## 2017-09-12 ENCOUNTER — Telehealth: Payer: Self-pay

## 2017-09-12 ENCOUNTER — Ambulatory Visit: Payer: Medicaid Other

## 2017-09-12 ENCOUNTER — Telehealth: Payer: Self-pay | Admitting: Obstetrics and Gynecology

## 2017-09-12 ENCOUNTER — Other Ambulatory Visit: Payer: Self-pay

## 2017-09-12 ENCOUNTER — Ambulatory Visit (INDEPENDENT_AMBULATORY_CARE_PROVIDER_SITE_OTHER): Payer: Medicaid Other | Admitting: Obstetrics and Gynecology

## 2017-09-12 VITALS — BP 100/65 | HR 82 | Wt 203.2 lb

## 2017-09-12 DIAGNOSIS — O98112 Syphilis complicating pregnancy, second trimester: Secondary | ICD-10-CM | POA: Diagnosis not present

## 2017-09-12 MED ORDER — PENICILLIN G BENZATHINE & PROC 900000-300000 UNIT/2ML IM SUSP: 2.4000 10*6.[IU] | Freq: Once | INTRAMUSCULAR | 0 refills | Status: AC

## 2017-09-12 MED ORDER — PENICILLIN G BENZATHINE & PROC 900000-300000 UNIT/2ML IM SUSP: 2.4000 10*6.[IU] | Freq: Once | INTRAMUSCULAR | Status: DC

## 2017-09-12 NOTE — Progress Notes (Signed)
HPI:      [name] is a 27 y.o. G3P1011 who LMP was Patient's last menstrual period was 04/28/2017.  Subjective:   She presents today after finding a positive RPR on her screening tests.  She says that she has never had this before, has new symptoms, and reports that her partner has never told her anything about this either.     Hx: The following portions of the patient's history were reviewed and updated as appropriate:             She  has a past medical history of Abnormal Pap smear, Anemia, and HPV (human papilloma virus) infection. She does not have any pertinent problems on file. She  has a past surgical history that includes Tonsillectomy; Cholecystectomy; and Condyloma excision/fulguration. Her family history includes Anemia in her sister; Depression in her sister; Diabetes in her mother; Hypertension in her father and sister. She  reports that  has never smoked. she has never used smokeless tobacco. She reports that she does not drink alcohol or use drugs. She has No Known Allergies.       Review of Systems:  Review of Systems  Constitutional: Denied constitutional symptoms, night sweats, recent illness, fatigue, fever, insomnia and weight loss.  Eyes: Denied eye symptoms, eye pain, photophobia, vision change and visual disturbance.  Ears/Nose/Throat/Neck: Denied ear, nose, throat or neck symptoms, hearing loss, nasal discharge, sinus congestion and sore throat.  Cardiovascular: Denied cardiovascular symptoms, arrhythmia, chest pain/pressure, edema, exercise intolerance, orthopnea and palpitations.  Respiratory: Denied pulmonary symptoms, asthma, pleuritic pain, productive sputum, cough, dyspnea and wheezing.  Gastrointestinal: Denied, gastro-esophageal reflux, melena, nausea and vomiting.  Genitourinary: Denied genitourinary symptoms including symptomatic vaginal discharge, pelvic relaxation issues, and urinary complaints.  Musculoskeletal: Denied musculoskeletal  symptoms, stiffness, swelling, muscle weakness and myalgia.  Dermatologic: Denied dermatology symptoms, rash and scar.  Neurologic: Denied neurology symptoms, dizziness, headache, neck pain and syncope.  Psychiatric: Denied psychiatric symptoms, anxiety and depression.  Endocrine: Denied endocrine symptoms including hot flashes and night sweats.   Meds:   Current Outpatient Medications on File Prior to Visit  Medication Sig Dispense Refill  . Prenatal Vit-Fe Fumarate-FA (PRENATAL MULTIVITAMIN) TABS tablet Take 1 tablet by mouth daily at 12 noon.    . [DISCONTINUED] sodium chloride (OCEAN NASAL SPRAY) 0.65 % nasal spray Place 1 spray into the nose as needed for congestion. 45 mL 3   No current facility-administered medications on file prior to visit.     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  Assessment:    G3P1011 Patient Active Problem List   Diagnosis Date Noted  . Fall from standing 11/23/2011  . Edema or excessive weight gain, antepartum 10/14/2011  . Anemia 10/09/2011  . Abnormal Pap smear and cervical HPV (human papillomavirus) 10/09/2011     1. 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  We will treat with IM penicillin.  2.4 million units  2.  Patient needs test of cure in 6 weeks.  3.  Treatment of all sexual partners discussed in detail   Orders Orders Placed This Encounter  Procedures  . POCT urinalysis dipstick     Meds ordered this encounter  Medications  . penicillin  g benzathine-penicillin g procaine (BICILLIN-CR) injection 900000-300000 units    Order Specific Question:   Antibiotic Indication:    Answer:   Other Indication (list below)    Order Specific Question:   Other Indication:    Answer:   RPR POS      F/U  No Follow-up on file. I spent 17 minutes with this patient of which greater than 50% was spent  discussing syphilis, treatment, necessity for follow-up, treatment of sexual partners, the sexually transmitted nature of the disease.  All questions answered.  Elonda Husky, M.D. 09/12/2017 10:13 AM

## 2017-09-12 NOTE — Telephone Encounter (Signed)
The patient called and stated that her medication is not at her pharmacy, and would like for a nurse to call her back. Please advise.

## 2017-09-12 NOTE — Telephone Encounter (Signed)
Informed pt script is at pharmacy. 

## 2017-09-12 NOTE — Telephone Encounter (Signed)
Pt called office to report [facility] would not have pts script ready until tomorrow. Pt was insistent that this office should have known the script wouldn't be ready in time. Informed pt we would have no way of knowing this. Pt was blaming us for pharmacy not having medicine. Explained to pt I could send to another pharmacy but her response was "I don't have gas to waste". Pt was raising her voice and I asked her to please calm down I was trying to help her. She asked "what am I supposed to do?" When I asked her if there was another pharmacy closer to her because she didn't have gas to waste she replied "Oh never mind -what do I have to do to come in on Monday make an appointment?" I told her yes she could make a nurse visit appointment. Call was given to DC receptionist and appointment made.

## 2017-09-12 NOTE — Telephone Encounter (Signed)
The patient called and stated that her medication was not at the pharmacy, The patient was upset and was using profanity and yelling on the phone. I informed the patient the patient that I would not be able to further assist her if she continued to raise her voice and use profanity. The patient was then placed on hold and transferred to a nurse to further assist her to discuss her pharmacy not having her medication until 09/13/17 and rescheduling her injection. Please advise.

## 2017-09-12 NOTE — Patient Instructions (Addendum)
Syphilis Syphilis is an infectious disease. It can cause serious complications if left untreated. What are the causes? Syphilis is caused by a type of bacteria called Treponema pallidum. It is most commonly spread through sexual contact. Syphilis may also spread to a fetus through the blood of the mother. What are the signs or symptoms? Symptoms vary depending on the stage of the disease. Some symptoms may disappear without treatment. However, this does not mean that the infection is gone. One form of syphilis (called latent syphilis) has no symptoms. Primary Syphilis  Painless sores (chancres) in and around the genital organs and mouth.  Swollen lymph nodes near the sores. Secondary Syphilis  A rash or sores over any portion of the body, including the palms of the hands and soles of the feet.  Fever.  Headache.  Sore throat.  Swollen lymph nodes.  New sores in the mouth or on the genitals.  Feeling generally ill.  Having pain in the joints. Tertiary Syphilis The third stage of syphilis involves severe damage to different organs in the body, such as the brain, spinal cord, and heart. Signs and symptoms may include:  Dementia.  Personality and mood changes.  Difficulty walking.  Heart failure.  Fainting.  Enlargement (aneurysm) of the aorta.  Tumors of the skin, bones, or liver.  Muscle weakness.  Sudden "lightning" pains, numbness, or tingling.  Problems with coordination.  Vision changes.  How is this diagnosed?  A physical exam will be done.  Blood tests will be done to confirm the diagnosis.  If the disease is in the first or second stages, a fluid (drainage) sample from a sore or rash may be examined under a microscope to detect the disease-causing bacteria.  Fluid around the spine may need to be examined to detect brain damage or inflammation of the brain lining (meningitis).  If the disease is in the third stage, X-rays, CT scans, MRIs,  echocardiograms, ultrasounds, or cardiac catheterization may also be done to detect disease of the heart, aorta, or brain. How is this treated? Syphilis can be cured with antibiotic medicine if a diagnosis is made early. During the first day of treatment, you may experience fever, chills, headache, nausea, or aching all over your body. This is a normal reaction to the antibiotics. Follow these instructions at home:  Take your antibiotic medicine as directed by your health care provider. Finish the antibiotic even if you start to feel better. Incomplete treatment will put you at risk for continued infection and could be life threatening.  Take medicines only as directed by your health care provider.  Do not have sexual intercourse until your treatment is completed or as directed by your health care provider.  Inform your recent sexual partners that you were diagnosed with syphilis. They need to seek care and treatment, even if they have no symptoms. It is necessary that all your sexual partners be tested for infection and treated if they have the disease.  Keep all follow-up visits as directed by your health care provider. It is important to keep all your appointments.  If your test results are not ready during your visit, make an appointment with your health care provider to find out the results. Do not assume everything is normal if you have not heard from your health care provider or the medical facility. It is your responsibility to get your test results. Contact a health care provider if:  You continue to have any of the following 24 hours after beginning   treatment: ? Fever. ? Chills. ? Headache. ? Nausea. ? Aching all over your body.  You have symptoms of an allergic reaction to medicine, such as: ? Chills. ? A headache. ? Light-headedness. ? A new rash (especially hives). ? Difficulty breathing. This information is not intended to replace advice given to you by your health care  provider. Make sure you discuss any questions you have with your health care provider. Document Released: 06/16/2013 Document Revised: 02/01/2016 Document Reviewed: 03/16/2013 Elsevier Interactive Patient Education  2017 [employer]  

## 2017-09-15 ENCOUNTER — Ambulatory Visit (INDEPENDENT_AMBULATORY_CARE_PROVIDER_SITE_OTHER): Payer: Medicaid Other | Admitting: Obstetrics and Gynecology

## 2017-09-15 DIAGNOSIS — A539 Syphilis, unspecified: Secondary | ICD-10-CM

## 2017-09-16 ENCOUNTER — Other Ambulatory Visit: Payer: Self-pay

## 2017-09-16 ENCOUNTER — Telehealth: Payer: Self-pay

## 2017-09-16 ENCOUNTER — Telehealth: Payer: Self-pay | Admitting: Obstetrics and Gynecology

## 2017-09-16 MED ORDER — PENICILLIN G BENZATHINE 2400000 UNIT/4ML IM SUSP: 2.4000 10*6.[IU] | Freq: Once | INTRAMUSCULAR | 0 refills | Status: AC

## 2017-09-16 NOTE — Telephone Encounter (Signed)
mychart message sent

## 2017-09-16 NOTE — Telephone Encounter (Signed)
The patient called and stated that she would like for her medication to be sent to a different pharmacy. [facility] [address] The patient would also like to speak with a nurse in regards to the medication and have confirmation once it is sent in. Please advise.

## 2017-09-17 ENCOUNTER — Encounter: Payer: Self-pay | Admitting: Obstetrics and Gynecology

## 2017-09-17 ENCOUNTER — Ambulatory Visit (INDEPENDENT_AMBULATORY_CARE_PROVIDER_SITE_OTHER): Payer: Medicaid Other | Admitting: Obstetrics and Gynecology

## 2017-09-17 ENCOUNTER — Ambulatory Visit (INDEPENDENT_AMBULATORY_CARE_PROVIDER_SITE_OTHER): Payer: Medicaid Other

## 2017-09-17 VITALS — BP 116/73 | HR 69 | Wt 198.6 lb

## 2017-09-17 DIAGNOSIS — Z3482 Encounter for supervision of other normal pregnancy, second trimester: Secondary | ICD-10-CM

## 2017-09-17 DIAGNOSIS — R630 Anorexia: Secondary | ICD-10-CM

## 2017-09-17 DIAGNOSIS — Z8619 Personal history of other infectious and parasitic diseases: Secondary | ICD-10-CM | POA: Insufficient documentation

## 2017-09-17 DIAGNOSIS — Z369 Encounter for antenatal screening, unspecified: Secondary | ICD-10-CM | POA: Diagnosis not present

## 2017-09-17 DIAGNOSIS — Z8759 Personal history of other complications of pregnancy, childbirth and the puerperium: Secondary | ICD-10-CM

## 2017-09-17 DIAGNOSIS — R4586 Emotional lability: Secondary | ICD-10-CM

## 2017-09-17 DIAGNOSIS — O98119 Syphilis complicating pregnancy, unspecified trimester: Secondary | ICD-10-CM | POA: Insufficient documentation

## 2017-09-17 DIAGNOSIS — Z862 Personal history of diseases of the blood and blood-forming organs and certain disorders involving the immune mechanism: Secondary | ICD-10-CM

## 2017-09-17 NOTE — Progress Notes (Signed)
ROB: Patient denies complaints today.  S/p normal anatomy scan today.  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 Partner also has not been treated.  Advised on safe sex practices.  [facility]anged with [facility] for patient to be treated today.  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 Partner will require treatment as well (although she has only been with current partner for 4-6 months, so if he is positive he will only require 1 dose).  Answered questions regarding syphilis and given handout.   Also discussed is that patient overall does not feel as well as she did last pregnancy. Notes feeling drained, no appetite, an sometimes "down" (but notes this is due to her being in the house more often).  Discussed use of SAM-E supplements for mood and energy boost. Also encouraged Boost or Ensure supplements if she is unable to tolerate whole meals, or decrease to smaller portions more frequently during the day.  Patient also mentions that she "lost a lot of blood" during her last delivery and had several syncopal episodes postpartum and is concerned about this happening again. 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     Will return in 4 weeks for OB appointment.  Will arrange for TOC depending on when last treatment was completed.    ULTRASOUND REPORT  Location: [facility] Date of Service:  09/17/17  Indications: Anatomy Findings:  Singleton intrauterine pregnancy is visualized with FHR at 149 BPM. Biometrics give an (U/S)  Gestational age of 28 1/7 weeks and an (U/S) EDD of 02/03/18; this correlates with the clinically established EDD of 02/04/18.  Fetal presentation is vertex.  EFW: 339 grams (0lb 12oz). Placenta: Anterior and grade 1.  Placenta is 3.8 cm from cervical os. AFI: WNL subjectively.  Anatomic survey is complete and appears WNL; Gender - Female.   Right Ovary measures 5.0 x 4.4 x 2.7 cm and appears WNL.  Left Ovary measures 3.5 x 2.4 x 2.6 cm and appears WNL.  There is no obvious evidence of a corpus luteal cyst. Survey of the adnexa demonstrates no adnexal masses. There is no free peritoneal fluid in the cul de sac.  Impression: 1. 20 1/7 week Viable Singleton Intrauterine pregnancy by U/S. 2. (U/S) EDD is consistent with Clinically established (LMP) EDD of 02/04/18. 3. Anatomy scan is complete and appears WNL.  Recommendations: 1.Clinical correlation with the patient's History and Physical Exam.   Kari BaarsJill Long, RDMS   The ultrasound images and findings were reviewed by me and I agree with the above report.  Elonda Huskyavid J. Evans, M.D. 09/17/2017 11:13 AM

## 2017-09-17 NOTE — Progress Notes (Signed)
ROB- Patient here to discuss Syphilis treatment.

## 2017-09-17 NOTE — Patient Instructions (Signed)
Syphilis Syphilis is an infectious disease. It can cause serious complications if left untreated. What are the causes? Syphilis is caused by a type of bacteria called Treponema pallidum. It is most commonly spread through sexual contact. Syphilis may also spread to a fetus through the blood of the mother. What are the signs or symptoms? Symptoms vary depending on the stage of the disease. Some symptoms may disappear without treatment. However, this does not mean that the infection is gone. One form of syphilis (called latent syphilis) has no symptoms. Primary Syphilis  Painless sores (chancres) in and around the genital organs and mouth.  Swollen lymph nodes near the sores. Secondary Syphilis  A rash or sores over any portion of the body, including the palms of the hands and soles of the feet.  Fever.  Headache.  Sore throat.  Swollen lymph nodes.  New sores in the mouth or on the genitals.  Feeling generally ill.  Having pain in the joints. Tertiary Syphilis The third stage of syphilis involves severe damage to different organs in the body, such as the brain, spinal cord, and heart. Signs and symptoms may include:  Dementia.  Personality and mood changes.  Difficulty walking.  Heart failure.  Fainting.  Enlargement (aneurysm) of the aorta.  Tumors of the skin, bones, or liver.  Muscle weakness.  Sudden "lightning" pains, numbness, or tingling.  Problems with coordination.  Vision changes.  How is this diagnosed?  A physical exam will be done.  Blood tests will be done to confirm the diagnosis.  If the disease is in the first or second stages, a fluid (drainage) sample from a sore or rash may be examined under a microscope to detect the disease-causing bacteria.  Fluid around the spine may need to be examined to detect brain damage or inflammation of the brain lining (meningitis).  If the disease is in the third stage, X-rays, CT scans, MRIs,  echocardiograms, ultrasounds, or cardiac catheterization may also be done to detect disease of the heart, aorta, or brain. How is this treated? Syphilis can be cured with antibiotic medicine if a diagnosis is made early. During the first day of treatment, you may experience fever, chills, headache, nausea, or aching all over your body. This is a normal reaction to the antibiotics. Follow these instructions at home:  Take your antibiotic medicine as directed by your health care provider. Finish the antibiotic even if you start to feel better. Incomplete treatment will put you at risk for continued infection and could be life threatening.  Take medicines only as directed by your health care provider.  Do not have sexual intercourse until your treatment is completed or as directed by your health care provider.  Inform your recent sexual partners that you were diagnosed with syphilis. They need to seek care and treatment, even if they have no symptoms. It is necessary that all your sexual partners be tested for infection and treated if they have the disease.  Keep all follow-up visits as directed by your health care provider. It is important to keep all your appointments.  If your test results are not ready during your visit, make an appointment with your health care provider to find out the results. Do not assume everything is normal if you have not heard from your health care provider or the medical facility. It is your responsibility to get your test results. Contact a health care provider if:  You continue to have any of the following 24 hours after beginning   treatment: ? Fever. ? Chills. ? Headache. ? Nausea. ? Aching all over your body.  You have symptoms of an allergic reaction to medicine, such as: ? Chills. ? A headache. ? Light-headedness. ? A new rash (especially hives). ? Difficulty breathing. This information is not intended to replace advice given to you by your health care  provider. Make sure you discuss any questions you have with your health care provider. Document Released: 06/16/2013 Document Revised: 02/01/2016 Document Reviewed: 03/16/2013 Elsevier Interactive Patient Education  2017 Elsevier Inc.  

## 2017-10-15 ENCOUNTER — Ambulatory Visit (INDEPENDENT_AMBULATORY_CARE_PROVIDER_SITE_OTHER): Payer: Medicaid Other | Admitting: Obstetrics and Gynecology

## 2017-10-15 ENCOUNTER — Encounter: Payer: Medicaid Other | Admitting: Obstetrics and Gynecology

## 2017-10-15 ENCOUNTER — Encounter: Payer: Self-pay | Admitting: Obstetrics and Gynecology

## 2017-10-15 VITALS — BP 103/69 | HR 87 | Wt 205.4 lb

## 2017-10-15 DIAGNOSIS — Z3492 Encounter for supervision of normal pregnancy, unspecified, second trimester: Secondary | ICD-10-CM

## 2017-10-15 LAB — POCT URINALYSIS DIPSTICK
Bilirubin, UA: NEGATIVE
Blood, UA: NEGATIVE
GLUCOSE UA: NEGATIVE
KETONES UA: NEGATIVE
Leukocytes, UA: NEGATIVE
NITRITE UA: NEGATIVE
PROTEIN UA: NEGATIVE
SPEC GRAV UA: 1.01 (ref 1.010–1.025)
Urobilinogen, UA: 0.2 E.U./dL
pH, UA: 6.5 (ref 5.0–8.0)

## 2017-10-15 NOTE — Progress Notes (Signed)
ROB: Complains of sciatica type pain in left leg.  States that she had in her right leg last time.  Stretching exercises discussed.  Also complains of mild shortness of breath with exertion saying that she feels tired.  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  Partner also treated.  Patient anxious to have test of cure as soon as possible.  Concerns regarding the baby discussed in detail. 1 hour GCT with next visit.

## 2017-10-15 NOTE — Progress Notes (Signed)
ROB- pt is having B leg pain, states she is SOB at times

## 2017-11-05 ENCOUNTER — Observation Stay
Admission: EM | Admit: 2017-11-05 | Discharge: 2017-11-05 | Disposition: A | Discharge status: Home / Self Care | Payer: Medicaid Other | Attending: Obstetrics and Gynecology | Admitting: Obstetrics and Gynecology

## 2017-11-05 ENCOUNTER — Other Ambulatory Visit: Payer: Self-pay

## 2017-11-05 DIAGNOSIS — Z3A27 27 weeks gestation of pregnancy: Secondary | ICD-10-CM

## 2017-11-05 DIAGNOSIS — O219 Vomiting of pregnancy, unspecified: Secondary | ICD-10-CM | POA: Diagnosis present

## 2017-11-05 DIAGNOSIS — O26892 Other specified pregnancy related conditions, second trimester: Secondary | ICD-10-CM | POA: Insufficient documentation

## 2017-11-05 DIAGNOSIS — O26893 Other specified pregnancy related conditions, third trimester: Secondary | ICD-10-CM

## 2017-11-05 MED ORDER — ONDANSETRON 4 MG PO TBDP
4.0000 mg | ORAL_TABLET | Freq: Four times a day (QID) | ORAL | Status: DC | PRN
  Administered 2017-11-05: 4 mg via ORAL
  Filled 2017-11-05: qty 1

## 2017-11-05 MED ORDER — ACETAMINOPHEN 325 MG PO TABS
650.0000 mg | ORAL_TABLET | Freq: Four times a day (QID) | ORAL | Status: DC | PRN
  Administered 2017-11-05: 650 mg via ORAL
  Filled 2017-11-05: qty 2

## 2017-11-05 NOTE — Progress Notes (Signed)
Spoke with Dr. Valentino Saxonherry on the phone regarding pt feeling less nausea and no pain at this time. Tolerated clear liquids and asking to eat solid food. Pt does continue to voice concern regarding abdominal pressure that has been going on this entire pregnancy, abdominal support encouraged by Dr. Valentino Saxonherry. Per Dr. Valentino Saxonherry, pt may be discharged to follow up as soon as possible in the office.

## 2017-11-05 NOTE — Progress Notes (Signed)
Discharged patient ambulatory with family member. Provided and reviewed discharge paperwork. Encouraged follow up as soon as possible if needed, appointment already scheduled for next Wednesday, March 6 for labs and visit with Dr. Valentino Saxonherry. Provided extensive education regarding soft, bland diet, abdominal support and exercises to assist with abdominal pressure. Encouraged soft, bland diet and increased fluids. Pt verbalized understanding. FHR reassuring, VS stable.

## 2017-11-05 NOTE — OB Triage Note (Signed)
Pt here from home reporting she has been having nausea with one episode of vomiting 2 hours ago, "mostly liquid". Reports she had pork rinds to eat prior to vomiting.  Had not have anything to eat/drink since. Also complains of lower abdominal pressure "when I walk." Rates the pain of the pressure 6/10. Denies any other issues. Reports positive fetal movement. Monitors applied/assessing.

## 2017-11-10 NOTE — Discharge Summary (Signed)
L&D OB Triage Note  Kimberly Dunn is a 27 y.o. G3P1011 female at 496w0d, EDD Estimated Date of Delivery: 02/04/18 who presented to triage for complaints of abdominal pain (pressure 6/10 when walking) and vomiting x 1 episode with nausea.  She was evaluated by the nurses with no significant findings. Vital signs stable. An NST was performed and has been reviewed by MD. She was treated with ODT Zofran, and was given Tylenol ES after passing PO challenge.   NST INTERPRETATION: Indications: rule out uterine contractions  Mode: External Baseline Rate (A): 145 bpm Variability: Moderate Accelerations: 15 x 15 Decelerations: None     Contraction Frequency (min): none  Impression: reactive   Plan: NST performed was reviewed and was found to be reactive. She was discharged home with instructions on BRAT diet for next 1-2 days, increasing fluid intake, and better food options. Also encouraged on Tylenol prn for aches of pregnancy, and pregnancy girdle for support.  Continue routine prenatal care. Follow up with OB/GYN as previously scheduled.     Kimberly LaserAnika Darlene Bartelt, MD  [facility]

## 2017-11-12 ENCOUNTER — Ambulatory Visit (INDEPENDENT_AMBULATORY_CARE_PROVIDER_SITE_OTHER): Payer: Medicaid Other | Admitting: Obstetrics and Gynecology

## 2017-11-12 ENCOUNTER — Other Ambulatory Visit: Payer: Medicaid Other

## 2017-11-12 VITALS — BP 96/62 | HR 77 | Wt 209.3 lb

## 2017-11-12 DIAGNOSIS — Z3480 Encounter for supervision of other normal pregnancy, unspecified trimester: Secondary | ICD-10-CM

## 2017-11-12 DIAGNOSIS — Z23 Encounter for immunization: Secondary | ICD-10-CM

## 2017-11-12 DIAGNOSIS — Z130 Encounter for screening for diseases of the blood and blood-forming organs and certain disorders involving the immune mechanism: Secondary | ICD-10-CM

## 2017-11-12 DIAGNOSIS — O09893 Supervision of other high risk pregnancies, third trimester: Secondary | ICD-10-CM

## 2017-11-12 DIAGNOSIS — R631 Polydipsia: Secondary | ICD-10-CM

## 2017-11-12 DIAGNOSIS — Z131 Encounter for screening for diabetes mellitus: Secondary | ICD-10-CM

## 2017-11-12 LAB — POCT URINALYSIS DIPSTICK
Bilirubin, UA: NEGATIVE
Blood, UA: NEGATIVE
GLUCOSE UA: NEGATIVE
Ketones, UA: NEGATIVE
LEUKOCYTES UA: NEGATIVE
Nitrite, UA: NEGATIVE
SPEC GRAV UA: 1.015 (ref 1.010–1.025)
Urobilinogen, UA: 0.2 E.U./dL
pH, UA: 7.5 (ref 5.0–8.0)

## 2017-11-12 MED ORDER — TETANUS-DIPHTH-ACELL PERTUSSIS 5-2.5-18.5 LF-MCG/0.5 IM SUSP
0.5000 mL | Freq: Once | INTRAMUSCULAR | Status: AC
  Administered 2017-11-12: 0.5 mL via INTRAMUSCULAR

## 2017-11-12 NOTE — Progress Notes (Signed)
Pt is really thirsty and noticed that it feels like that its not enough. When she drinks too much water she vomits and throws up water not food.

## 2017-11-12 NOTE — Progress Notes (Signed)
ROB: Patient notes back pain.  Has not tried belly band yet, but trying stretches. Also recommended pregnancy massage and chiropractor.  For 28 week labs today.  Desires to breast and formula feed,  desires Depo provera for contraception. For Tdap today, signed blood consent, discussed cord blood banking.

## 2017-11-14 LAB — CBC
HEMATOCRIT: 32.9 % — AB (ref 34.0–46.6)
Hemoglobin: 10.8 g/dL — ABNORMAL LOW (ref 11.1–15.9)
MCH: 29.1 pg (ref 26.6–33.0)
MCHC: 32.8 g/dL (ref 31.5–35.7)
MCV: 89 fL (ref 79–97)
Platelets: 300 10*3/uL (ref 150–379)
RBC: 3.71 x10E6/uL — ABNORMAL LOW (ref 3.77–5.28)
RDW: 12.5 % (ref 12.3–15.4)
WBC: 7.7 10*3/uL (ref 3.4–10.8)

## 2017-11-14 LAB — RPR: RPR Ser Ql: REACTIVE — AB

## 2017-11-14 LAB — GLUCOSE, 1 HOUR GESTATIONAL: GESTATIONAL DIABETES SCREEN: 72 mg/dL (ref 65–139)

## 2017-11-14 LAB — RPR, QUANT+TP ABS (REFLEX)
Rapid Plasma Reagin, Quant: 1:16 {titer} — ABNORMAL HIGH
T Pallidum Abs: POSITIVE — AB

## 2017-11-19 ENCOUNTER — Telehealth: Payer: Self-pay | Admitting: Obstetrics and Gynecology

## 2017-11-19 NOTE — Telephone Encounter (Signed)
The patient called and stated that she would like to speak with a nurse in regards to results and how the patient should move forward as far as treating her infection, Please advise.

## 2017-11-20 NOTE — Telephone Encounter (Signed)
Attempted to call the pt several times mailed a letter to her home informing her of her test results. But will continue to attempt to get in contact with her to see if she received the letter.

## 2017-11-26 ENCOUNTER — Encounter: Payer: Medicaid Other | Admitting: Obstetrics and Gynecology

## 2017-12-03 ENCOUNTER — Encounter: Payer: Self-pay | Admitting: Obstetrics and Gynecology

## 2017-12-03 ENCOUNTER — Ambulatory Visit (INDEPENDENT_AMBULATORY_CARE_PROVIDER_SITE_OTHER): Payer: Medicaid Other | Admitting: Obstetrics and Gynecology

## 2017-12-03 VITALS — BP 96/59 | HR 83 | Wt 217.1 lb

## 2017-12-03 DIAGNOSIS — Z3480 Encounter for supervision of other normal pregnancy, unspecified trimester: Secondary | ICD-10-CM | POA: Diagnosis not present

## 2017-12-03 LAB — POCT URINALYSIS DIPSTICK
BILIRUBIN UA: NEGATIVE
Glucose, UA: NEGATIVE
KETONES UA: NEGATIVE
Leukocytes, UA: NEGATIVE
Nitrite, UA: NEGATIVE
PH UA: 7.5 (ref 5.0–8.0)
Protein, UA: NEGATIVE
RBC UA: NEGATIVE
SPEC GRAV UA: 1.025 (ref 1.010–1.025)
UROBILINOGEN UA: 0.2 U/dL

## 2017-12-03 NOTE — Progress Notes (Signed)
ROB: Still complains of sciatica despite belly band and stretching exercises.  Patient says the more physically active she is the worse it gets.  Reports active fetal movement.  RPR test of cure at 35 weeks discussed in detail.

## 2017-12-17 ENCOUNTER — Encounter: Payer: Medicaid Other | Admitting: Obstetrics and Gynecology

## 2017-12-22 ENCOUNTER — Ambulatory Visit (INDEPENDENT_AMBULATORY_CARE_PROVIDER_SITE_OTHER): Payer: Medicaid Other | Admitting: Obstetrics and Gynecology

## 2017-12-22 ENCOUNTER — Encounter: Payer: Self-pay | Admitting: Obstetrics and Gynecology

## 2017-12-22 VITALS — BP 98/65 | HR 83 | Wt 216.9 lb

## 2017-12-22 DIAGNOSIS — R12 Heartburn: Secondary | ICD-10-CM

## 2017-12-22 DIAGNOSIS — O26893 Other specified pregnancy related conditions, third trimester: Secondary | ICD-10-CM | POA: Insufficient documentation

## 2017-12-22 DIAGNOSIS — O99013 Anemia complicating pregnancy, third trimester: Secondary | ICD-10-CM

## 2017-12-22 DIAGNOSIS — Z3483 Encounter for supervision of other normal pregnancy, third trimester: Secondary | ICD-10-CM

## 2017-12-22 LAB — POCT URINALYSIS DIPSTICK
Bilirubin, UA: NEGATIVE
Blood, UA: NEGATIVE
Glucose, UA: NEGATIVE
Ketones, UA: NEGATIVE
LEUKOCYTES UA: NEGATIVE
NITRITE UA: NEGATIVE
PH UA: 6 (ref 5.0–8.0)
Protein, UA: NEGATIVE
UROBILINOGEN UA: 0.2 U/dL

## 2017-12-22 MED ORDER — FERROUS SULFATE 325 (65 FE) MG PO TABS: 325.0000 mg | ORAL_TABLET | Freq: Two times a day (BID) | ORAL | 1 refills | Status: DC

## 2017-12-22 MED ORDER — DOCUSATE SODIUM 100 MG PO CAPS: 100.0000 mg | ORAL_CAPSULE | Freq: Two times a day (BID) | ORAL | 2 refills | Status: DC | PRN

## 2017-12-22 MED ORDER — RANITIDINE HCL 150 MG PO TABS: 150.0000 mg | ORAL_TABLET | Freq: Two times a day (BID) | ORAL | 3 refills | Status: DC

## 2017-12-22 NOTE — Progress Notes (Signed)
[name] has been craving ice. Pt is have some pain and cramping in the abd area after sex.

## 2017-12-22 NOTE — Progress Notes (Signed)
ROB: Notes craving ice. Not taking iron tablets as previously recommended, encouraged compliance. Also notes cramping after intercourse. Discussed that this can be normal, methods to prevent cramping discussed.  Also noting heartburn not relieved by Tums.  Prescribed Zantac. Given samples of iron tablets and will send in prescription. RTC 2 weeks.

## 2018-01-06 ENCOUNTER — Encounter: Payer: Medicaid Other | Admitting: Obstetrics and Gynecology

## 2018-01-07 ENCOUNTER — Encounter: Payer: Self-pay | Admitting: *Deleted

## 2018-01-07 ENCOUNTER — Observation Stay
Admission: EM | Admit: 2018-01-07 | Discharge: 2018-01-07 | Disposition: A | Discharge status: Home / Self Care | Payer: Medicaid Other | Attending: Obstetrics and Gynecology | Admitting: Obstetrics and Gynecology

## 2018-01-07 DIAGNOSIS — M545 Low back pain: Secondary | ICD-10-CM | POA: Insufficient documentation

## 2018-01-07 DIAGNOSIS — O26893 Other specified pregnancy related conditions, third trimester: Principal | ICD-10-CM | POA: Insufficient documentation

## 2018-01-07 DIAGNOSIS — Z3A36 36 weeks gestation of pregnancy: Secondary | ICD-10-CM | POA: Insufficient documentation

## 2018-01-07 DIAGNOSIS — Z3490 Encounter for supervision of normal pregnancy, unspecified, unspecified trimester: Secondary | ICD-10-CM

## 2018-01-07 LAB — URINALYSIS, ROUTINE W REFLEX MICROSCOPIC
BILIRUBIN URINE: NEGATIVE
GLUCOSE, UA: NEGATIVE mg/dL
HGB URINE DIPSTICK: NEGATIVE
Ketones, ur: NEGATIVE mg/dL
Leukocytes, UA: NEGATIVE
Nitrite: NEGATIVE
Protein, ur: NEGATIVE mg/dL
SPECIFIC GRAVITY, URINE: 1.018 (ref 1.005–1.030)
pH: 6 (ref 5.0–8.0)

## 2018-01-07 MED ORDER — ACETAMINOPHEN 500 MG PO TABS
ORAL_TABLET | ORAL | Status: AC
  Administered 2018-01-07: 1000 mg via ORAL
  Filled 2018-01-07: qty 2

## 2018-01-07 MED ORDER — ACETAMINOPHEN 500 MG PO TABS
1000.0000 mg | ORAL_TABLET | ORAL | Status: DC | PRN
  Administered 2018-01-07: 1000 mg via ORAL

## 2018-01-07 NOTE — Discharge Instructions (Signed)
Call today to reschedule prenatal appointment for as soon as possible. Drink plenty of water.

## 2018-01-07 NOTE — OB Triage Note (Signed)
Pt arrived from home with complaints of sharp right sided back pain. Pt denies any contractions, leaking fluid, or vaginal bleeding. Pt reports positive fetal movement. Pt reports back pain started around 2230 and has been constant.

## 2018-01-07 NOTE — Discharge Summary (Signed)
L&D OB Triage Note  SUBJECTIVE Kimberly Dunn is a 27 y.o. G3P1011 female at [redacted]w[redacted]d, EDD Estimated Date of Delivery: 02/04/18 who presented to triage with complaints of back pain, midline, lower lumbar sacral region.  Symptoms have been ongoing for several days.  Patient did not come into the office earlier today for an appointment which was scheduled.  No reported decreased fetal movement, contractions, vaginal bleeding, or preeclampsia symptoms   OBJECTIVE Nursing Evaluation: Temp 97.8 F (36.6 C) (Oral)   Ht  (1.727 m)   Wt 215 lb (97.5 kg)   LMP 04/28/2017   BMI 32.69 kg/m .  Urinalysis, Routine w reflex microscopic (Order [id number])  Contains abnormal data Urinalysis, Routine w reflex microscopic  Order: [id number]  Status:  Final result Visible to patient:  Yes (MyChart) Next appt:  None   Ref Range & Units 01:24 (01/07/18) 2wk ago (12/22/17) 39mo ago (12/03/17) 39mo ago (11/12/17) 40mo ago (10/15/17)  Color, Urine YELLOW YELLOWAbnormal        APPearance CLEAR CLEARAbnormal   yellow R  yellow R   Specific Gravity, Urine 1.005 - 1.030 1.018       pH 5.0 - 8.0 6.0       Glucose, UA NEGATIVE mg/dL NEGATIVE  neg R neg R neg R neg R  Hgb urine dipstick NEGATIVE NEGATIVE       Bilirubin Urine NEGATIVE NEGATIVE       Ketones, ur NEGATIVE mg/dL NEGATIVE       Protein, ur NEGATIVE mg/dL NEGATIVE       Nitrite NEGATIVE NEGATIVE       Leukocytes, UA NEGATIVE NEGATIVE  Negative R Negative R Negative R Negative R  Comment: Performed at [facility], [address] [place], [place] [address]  Color, UA   yellow  yellow  yellow  yellow   Nitrite, UA   neg  neg  neg  neg   Odor        Clarity, UA   clear  clear  clear  clear   Bilirubin, UA   neg  neg  neg  neg   Ketones, UA   neg  neg  neg  neg   Spec Grav, UA   >=1.030Abnormal   1.025  1.015  1.010   Blood, UA   neg  neg  neg  neg   pH, UA   6.0  7.5  7.5  6.5   Protein, UA   neg  neg  trace  neg   Urobilinogen, UA    0.2  0.2  0.2  0.2   Resulting Agency  [facility]            NST was performed and has been reviewed by me.  NST INTERPRETATION: Indications: Back pain  Mode: External Baseline Rate (A): 135 bpm Variability: Moderate Accelerations: 15 x 15 Decelerations: None     Contraction Frequency (min): occaisional  ASSESSMENT Impression:  1. Pregnancy:  G3P1011 at [redacted]w[redacted]d , EDD Estimated Date of Delivery: 02/04/18 2.  reactive NST 3.  Low back pain, pregnancy, nonacute  PLAN 1. Reassurance given 2. Discharge home with bleeding/labor precautions.  3. Continue routine prenatal care.  Follow-up in office within 1 to 2 days if symptoms persist or worsen   Herold Harms, MD

## 2018-01-08 LAB — URINE CULTURE: Culture: NO GROWTH

## 2018-01-13 ENCOUNTER — Ambulatory Visit (INDEPENDENT_AMBULATORY_CARE_PROVIDER_SITE_OTHER): Payer: Medicaid Other | Admitting: Obstetrics and Gynecology

## 2018-01-13 ENCOUNTER — Encounter: Payer: Self-pay | Admitting: Obstetrics and Gynecology

## 2018-01-13 VITALS — BP 93/63 | HR 69 | Wt 224.6 lb

## 2018-01-13 DIAGNOSIS — O98119 Syphilis complicating pregnancy, unspecified trimester: Secondary | ICD-10-CM

## 2018-01-13 DIAGNOSIS — Z3685 Encounter for antenatal screening for Streptococcus B: Secondary | ICD-10-CM

## 2018-01-13 DIAGNOSIS — Z202 Contact with and (suspected) exposure to infections with a predominantly sexual mode of transmission: Secondary | ICD-10-CM

## 2018-01-13 DIAGNOSIS — Z3483 Encounter for supervision of other normal pregnancy, third trimester: Secondary | ICD-10-CM

## 2018-01-13 LAB — POCT URINALYSIS DIPSTICK
BILIRUBIN UA: NEGATIVE
Clarity, UA: NEGATIVE
GLUCOSE UA: NEGATIVE
Ketones, UA: NEGATIVE
Nitrite, UA: NEGATIVE
Odor: NEGATIVE
PH UA: 7.5 (ref 5.0–8.0)
RBC UA: NEGATIVE
SPEC GRAV UA: 1.015 (ref 1.010–1.025)
UROBILINOGEN UA: 0.2 U/dL

## 2018-01-13 NOTE — Progress Notes (Signed)
ROB- 36 week cultures and RPR- done

## 2018-01-13 NOTE — Addendum Note (Signed)
Addended by: Marchelle Folks on: 01/13/2018 04:32 PM   Modules accepted: Orders

## 2018-01-13 NOTE — Progress Notes (Signed)
ROB:  C/o back pain but otherwise well.  GBS  GC/Ct done.  RPR done.  F/u 1 weeks.

## 2018-01-14 LAB — GC/CHLAMYDIA PROBE AMP
CHLAMYDIA, DNA PROBE: NEGATIVE
Neisseria gonorrhoeae by PCR: NEGATIVE

## 2018-01-15 LAB — STREP GP B NAA: STREP GROUP B AG: NEGATIVE

## 2018-01-15 LAB — RPR, QUANT+TP ABS (REFLEX)
Rapid Plasma Reagin, Quant: 1:8 {titer} — ABNORMAL HIGH
T Pallidum Abs: POSITIVE — AB

## 2018-01-15 LAB — RPR: RPR Ser Ql: REACTIVE — AB

## 2018-01-20 ENCOUNTER — Ambulatory Visit (INDEPENDENT_AMBULATORY_CARE_PROVIDER_SITE_OTHER): Payer: Medicaid Other | Admitting: Obstetrics and Gynecology

## 2018-01-20 VITALS — BP 98/66 | HR 70 | Wt 227.7 lb

## 2018-01-20 DIAGNOSIS — O99619 Diseases of the digestive system complicating pregnancy, unspecified trimester: Secondary | ICD-10-CM

## 2018-01-20 DIAGNOSIS — O99820 Streptococcus B carrier state complicating pregnancy: Secondary | ICD-10-CM

## 2018-01-20 DIAGNOSIS — Z3483 Encounter for supervision of other normal pregnancy, third trimester: Secondary | ICD-10-CM

## 2018-01-20 DIAGNOSIS — K219 Gastro-esophageal reflux disease without esophagitis: Secondary | ICD-10-CM

## 2018-01-20 LAB — POCT URINALYSIS DIPSTICK
Bilirubin, UA: NEGATIVE
Blood, UA: NEGATIVE
Glucose, UA: NEGATIVE
Ketones, UA: NEGATIVE
Leukocytes, UA: NEGATIVE
Nitrite, UA: NEGATIVE
Spec Grav, UA: 1.02 (ref 1.010–1.025)
Urobilinogen, UA: 0.2 U/dL
pH, UA: 6.5 (ref 5.0–8.0)

## 2018-01-20 MED ORDER — DOXYLAMINE SUCCINATE (SLEEP) 25 MG PO TABS: 25.0000 mg | ORAL_TABLET | Freq: Every evening | ORAL | 2 refills | Status: DC | PRN

## 2018-01-20 MED ORDER — RANITIDINE HCL 150 MG PO TABS: 150.0000 mg | ORAL_TABLET | Freq: Two times a day (BID) | ORAL | 1 refills | Status: DC

## 2018-01-20 NOTE — Patient Instructions (Signed)
Group B Streptococcus Infection During Pregnancy Group B Streptococcus (GBS) is a type of bacteria (Streptococcus agalactiae) that is often found in healthy people, commonly in the rectum, vagina, and intestines. In people who are healthy and not pregnant, the bacteria rarely cause serious illness or complications. However, women who test positive for GBS during pregnancy can pass the bacteria to their baby during childbirth, which can cause serious infection in the baby after birth. Women with GBS may also have infections during their pregnancy or immediately after childbirth, such as such as urinary tract infections (UTIs) or infections of the uterus (uterine infections). Having GBS also increases a woman's risk of complications during pregnancy, such as early (preterm) labor or delivery, miscarriage, or stillbirth. Routine testing (screening) for GBS is recommended for all pregnant women. What increases the risk? You may have a higher risk for GBS infection during pregnancy if you had one during a past pregnancy. What are the signs or symptoms? In most cases, GBS infection does not cause symptoms in pregnant women. Signs and symptoms of a possible GBS-related infection may include:  Labor starting before the 37th week of pregnancy.  A UTI or bladder infection, which may cause: ? Fever. ? Pain or burning during urination. ? Frequent urination.  Fever during labor, along with: ? Bad-smelling discharge. ? Uterine tenderness. ? Rapid heartbeat in the mother, baby, or both.  Rare but serious symptoms of a possible GBS-related infection in women include:  Blood infection (septicemia). This may cause fever, chills, or confusion.  Lung infection (pneumonia). This may cause fever, chills, cough, rapid breathing, difficulty breathing, or chest pain.  Bone, joint, skin, or soft tissue infection.  How is this diagnosed? You may be screened for GBS between week 35 and week 37 of your pregnancy. If  you have symptoms of preterm labor, you may be screened earlier. This condition is diagnosed based on lab test results from:  A swab of fluid from the vagina and rectum.  A urine sample.  How is this treated? This condition is treated with antibiotic medicine. When you go into labor, or as soon as your water breaks (your membranes rupture), you will be given antibiotics through an IV tube. Antibiotics will continue until after you give birth. If you are having a cesarean delivery, you do not need antibiotics unless your membranes have already ruptured. Follow these instructions at home:  Take over-the-counter and prescription medicines only as told by your health care provider.  Take your antibiotic medicine as told by your health care provider. Do not stop taking the antibiotic even if you start to feel better.  Keep all pre-birth (prenatal) visits and follow-up visits as told by your health care provider. This is important. Contact a health care provider if:  You have pain or burning when you urinate.  You have to urinate frequently.  You have a fever or chills.  You develop a bad-smelling vaginal discharge. Get help right away if:  Your membranes rupture.  You go into labor.  You have severe pain in your abdomen.  You have difficulty breathing.  You have chest pain. This information is not intended to replace advice given to you by your health care provider. Make sure you discuss any questions you have with your health care provider. Document Released: 12/03/2007 Document Revised: 03/22/2016 Document Reviewed: 03/21/2016 Elsevier Interactive Patient Education  2018 [employer]  

## 2018-01-20 NOTE — Progress Notes (Signed)
ROB:  Complains of fatigue, discomfort while sleeping. Advised on Unisom/Benadryl, warm baths before bed. Also notes heartburn worse at night, not relieved by Tums. Prescribed Zantac. GBS+, discussed need for abx in labor. Discussed labor precautions. RTC in 1 week.

## 2018-01-20 NOTE — Progress Notes (Signed)
[name] is having a lot of pelvic pain that causes her to lay down a lot.

## 2018-01-27 ENCOUNTER — Ambulatory Visit (INDEPENDENT_AMBULATORY_CARE_PROVIDER_SITE_OTHER): Payer: Medicaid Other | Admitting: Obstetrics and Gynecology

## 2018-01-27 ENCOUNTER — Encounter: Payer: Self-pay | Admitting: Obstetrics and Gynecology

## 2018-01-27 VITALS — BP 99/64 | HR 75 | Wt 230.3 lb

## 2018-01-27 DIAGNOSIS — O98119 Syphilis complicating pregnancy, unspecified trimester: Secondary | ICD-10-CM

## 2018-01-27 DIAGNOSIS — Z3483 Encounter for supervision of other normal pregnancy, third trimester: Secondary | ICD-10-CM

## 2018-01-27 LAB — POCT URINALYSIS DIPSTICK
BILIRUBIN UA: NEGATIVE
Blood, UA: NEGATIVE
GLUCOSE UA: NEGATIVE
KETONES UA: NEGATIVE
Leukocytes, UA: NEGATIVE
Nitrite, UA: NEGATIVE
Odor: NEGATIVE
Protein, UA: NEGATIVE
Spec Grav, UA: 1.015 (ref 1.010–1.025)
Urobilinogen, UA: 0.2 E.U./dL
pH, UA: 7 (ref 5.0–8.0)

## 2018-01-27 NOTE — Progress Notes (Signed)
ROB: Patient denies contractions.  Generally doing well without complaint.  States that she "really does not want to go past her due date". (No particular reason given when asked. -I believe she is mainly just tired of being pregnant.) Signs and symptoms of labor discussed.  No cervical changesince last visit.  We discussed her declining RPR titers.

## 2018-01-27 NOTE — Progress Notes (Signed)
ROB- left sided pain at times.

## 2018-02-02 ENCOUNTER — Inpatient Hospital Stay
Admission: EM | Admit: 2018-02-02 | Discharge: 2018-02-04 | DRG: 807 | Disposition: A | Discharge status: Home / Self Care | Payer: Medicaid Other | Attending: Obstetrics and Gynecology | Admitting: Obstetrics and Gynecology

## 2018-02-02 ENCOUNTER — Other Ambulatory Visit: Payer: Self-pay

## 2018-02-02 DIAGNOSIS — O9902 Anemia complicating childbirth: Secondary | ICD-10-CM | POA: Diagnosis present

## 2018-02-02 DIAGNOSIS — Z3A39 39 weeks gestation of pregnancy: Secondary | ICD-10-CM

## 2018-02-02 DIAGNOSIS — Z3490 Encounter for supervision of normal pregnancy, unspecified, unspecified trimester: Secondary | ICD-10-CM

## 2018-02-02 DIAGNOSIS — R12 Heartburn: Secondary | ICD-10-CM | POA: Diagnosis present

## 2018-02-02 DIAGNOSIS — Z3483 Encounter for supervision of other normal pregnancy, third trimester: Secondary | ICD-10-CM | POA: Diagnosis present

## 2018-02-02 DIAGNOSIS — D649 Anemia, unspecified: Secondary | ICD-10-CM | POA: Diagnosis present

## 2018-02-02 DIAGNOSIS — O9962 Diseases of the digestive system complicating childbirth: Principal | ICD-10-CM | POA: Diagnosis present

## 2018-02-02 LAB — CBC
HCT: 31.3 % — ABNORMAL LOW (ref 35.0–47.0)
Hemoglobin: 10.6 g/dL — ABNORMAL LOW (ref 12.0–16.0)
MCH: 28.3 pg (ref 26.0–34.0)
MCHC: 33.9 g/dL (ref 32.0–36.0)
MCV: 83.4 fL (ref 80.0–100.0)
Platelets: 246 10*3/uL (ref 150–440)
RBC: 3.75 MIL/uL — AB (ref 3.80–5.20)
RDW: 15.6 % — ABNORMAL HIGH (ref 11.5–14.5)
WBC: 6.5 10*3/uL (ref 3.6–11.0)

## 2018-02-02 LAB — TYPE AND SCREEN
ABO/RH(D): A POS
Antibody Screen: NEGATIVE

## 2018-02-02 MED ORDER — LACTATED RINGERS IV SOLN: Freq: Once | INTRAVENOUS | Status: DC

## 2018-02-02 MED ORDER — BUTORPHANOL TARTRATE 1 MG/ML IJ SOLN: 1.0000 mg | INTRAMUSCULAR | Status: DC | PRN

## 2018-02-02 MED ORDER — SOD CITRATE-CITRIC ACID 500-334 MG/5ML PO SOLN: 30.0000 mL | ORAL | Status: DC | PRN

## 2018-02-02 MED ORDER — OXYCODONE-ACETAMINOPHEN 5-325 MG PO TABS: 2.0000 | ORAL_TABLET | ORAL | Status: DC | PRN

## 2018-02-02 MED ORDER — AMPICILLIN SODIUM 2 G IJ SOLR: 2.0000 g | Freq: Once | INTRAMUSCULAR | Status: DC

## 2018-02-02 MED ORDER — LACTATED RINGERS IV SOLN
INTRAVENOUS | Status: DC
  Administered 2018-02-02: 19:00:00 via INTRAVENOUS

## 2018-02-02 MED ORDER — OXYTOCIN 40 UNITS IN LACTATED RINGERS INFUSION - SIMPLE MED
INTRAVENOUS | Status: AC
  Filled 2018-02-02: qty 1000

## 2018-02-02 MED ORDER — OXYTOCIN 40 UNITS IN LACTATED RINGERS INFUSION - SIMPLE MED
2.5000 [IU]/h | INTRAVENOUS | Status: DC
  Administered 2018-02-02: 2.5 [IU]/h via INTRAVENOUS

## 2018-02-02 MED ORDER — OXYTOCIN BOLUS FROM INFUSION: 500.0000 mL | Freq: Once | INTRAVENOUS | Status: DC

## 2018-02-02 MED ORDER — OXYTOCIN 40 UNITS IN LACTATED RINGERS INFUSION - SIMPLE MED
1.0000 m[IU]/min | INTRAVENOUS | Status: DC
  Administered 2018-02-02: 2 m[IU]/min via INTRAVENOUS

## 2018-02-02 MED ORDER — ONDANSETRON HCL 4 MG/2ML IJ SOLN: 4.0000 mg | Freq: Four times a day (QID) | INTRAMUSCULAR | Status: DC | PRN

## 2018-02-02 MED ORDER — OXYCODONE-ACETAMINOPHEN 5-325 MG PO TABS: 1.0000 | ORAL_TABLET | ORAL | Status: DC | PRN

## 2018-02-02 MED ORDER — LIDOCAINE HCL (PF) 1 % IJ SOLN: 30.0000 mL | INTRAMUSCULAR | Status: DC | PRN

## 2018-02-02 MED ORDER — OXYTOCIN 10 UNIT/ML IJ SOLN
INTRAMUSCULAR | Status: AC
  Filled 2018-02-02: qty 2

## 2018-02-02 MED ORDER — MISOPROSTOL 200 MCG PO TABS
ORAL_TABLET | ORAL | Status: AC
  Filled 2018-02-02: qty 4

## 2018-02-02 MED ORDER — OXYTOCIN BOLUS FROM INFUSION
500.0000 mL | Freq: Once | INTRAVENOUS | Status: AC
  Administered 2018-02-02: 500 mL via INTRAVENOUS

## 2018-02-02 MED ORDER — AMMONIA AROMATIC IN INHA
RESPIRATORY_TRACT | Status: AC
  Filled 2018-02-02: qty 10

## 2018-02-02 MED ORDER — LACTATED RINGERS IV SOLN: 500.0000 mL | INTRAVENOUS | Status: DC | PRN

## 2018-02-02 MED ORDER — ACETAMINOPHEN 325 MG PO TABS: 650.0000 mg | ORAL_TABLET | ORAL | Status: DC | PRN

## 2018-02-02 MED ORDER — LACTATED RINGERS IV SOLN
500.0000 mL | INTRAVENOUS | Status: DC | PRN
Start: 2018-02-02 — End: 2018-02-03

## 2018-02-02 MED ORDER — LIDOCAINE HCL (PF) 1 % IJ SOLN
INTRAMUSCULAR | Status: AC
  Filled 2018-02-02: qty 30

## 2018-02-02 NOTE — OB Triage Note (Signed)
Pt presents c/o of lower abdominal cramps since 5 am this morning. Pt states she hasn't had recent intercourse and has had plenty to eat and drink today. Denies any bleeding or LOF. Reports positive fetal movement. Vitals WNL. Will continue to monitor.

## 2018-02-02 NOTE — H&P (Signed)
History and Physical   HPI  Kimberly Dunn is a 27 y.o. G3P1011 at [redacted]w[redacted]d Estimated Date of Delivery: 02/04/18 who is being admitted for labor H/O RPR and VDRL pos - treated with declining titers now.   OB History  OB History  Gravida Para Term Preterm AB Living  0 1 1  SAB TAB Ectopic Multiple Live Births  1 0 0 0 1    # Outcome Date GA Lbr Len/2nd Weight Sex Delivery Anes PTL Lv  3 Current           2 SAB 2018          1 Term 12/17/11 [redacted]w[redacted]d / 00:25 7 lb 8 oz (3.402 kg) F Vag-Spont None  LIV     Name: Hargraves,GIRL Dalylah     Apgar1: 8  Apgar5: 9    PROBLEM LIST  Pregnancy complications or risks: Patient Active Problem List   Diagnosis Date Noted  . Normal labor 02/02/2018  . Pregnancy 01/07/2018  . Anemia of pregnancy in third trimester 12/22/2017  . Heartburn in pregnancy in third trimester 12/22/2017  . Nausea and vomiting in pregnancy 11/05/2017  . History of postpartum hemorrhage 09/17/2017  . 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 Fall from standing 11/23/2011  . Edema or excessive weight gain, antepartum 10/14/2011  . Anemia 10/09/2011  . Abnormal Pap smear and cervical HPV (human papillomavirus) 10/09/2011    Prenatal labs and studies: ABO, Rh: --/--/A POS (05/27 1832) Antibody: NEG (05/27 1832) Rubella: 2.51 (12/19 1240) RPR: Reactive (05/07 1639)  HBsAg: Negative (12/19 1240)  HIV: Non Reactive (12/19 1240)  ZOX:WRUEAVWU (05/07 1645)   Past Medical History:  Diagnosis Date  . Abnormal Pap smear   . Anemia   . HPV (human papilloma virus) infection      Past Surgical History:  Procedure Laterality Date  . CHOLECYSTECTOMY    . CONDYLOMA EXCISION/FULGURATION    . TONSILLECTOMY       Medications    Current Discharge Medication List    CONTINUE these medications which have NOT CHANGED   Details  Prenatal Vit-Fe Fumarate-FA (PRENATAL MULTIVITAMIN) TABS tablet Take 1 tablet by mouth daily at 12 noon.      ranitidine (ZANTAC) 150 MG tablet Take 1 tablet (150 mg total) by mouth 2 (two) times daily. Qty: 60 tablet, Refills: 1    acetaminophen (TYLENOL) 325 MG tablet Take 650 mg by mouth every 6 (six) hours as needed.    doxylamine, Sleep, (UNISOM) 25 MG tablet Take 1 tablet (25 mg total) by mouth at bedtime as needed for sleep. Qty: 30 tablet, Refills: 2         Allergies  Patient has no known allergies.  Review of Systems  Pertinent items are noted in HPI.  Physical Exam  BP 124/71 (BP Location: Left Arm)   Pulse 68   Temp 98.2 F (36.8 C) (Oral)   Resp 18   Ht  (1.727 m)   Wt 230 lb (104.3 kg)   LMP 04/28/2017   SpO2 100%   BMI 34.97 kg/m   Lungs:  CTA B Cardio: RRR without M/R/G Abd: Soft, gravid, NT Presentation: cephalic EXT: No C/C/ 1+ Edema DTRs: 2+ B CERVIX: 4cm/70%  See Prenatal records for more detailed PE.     FHR:  Variability: Good {> 6 bpm)  Toco: Uterine Contractions: q3-8 minutes - irreg.   Test Results  Results for orders placed or performed during the hospital  encounter of 02/02/18 (from the past 24 hour(s))  CBC     Status: Abnormal   Collection Time: 02/02/18  6:32 PM  Result Value Ref Range   WBC 6.5 3.6 - 11.0 K/uL   RBC 3.75 (L) 3.80 - 5.20 MIL/uL   Hemoglobin 10.6 (L) 12.0 - 16.0 g/dL   HCT 16.1 (L) 09.6 - 04.5 %   MCV 83.4 80.0 - 100.0 fL   MCH 28.3 26.0 - 34.0 pg   MCHC 33.9 32.0 - 36.0 g/dL   RDW 40.9 (H) 81.1 - 91.4 %   Platelets 246 150 - 440 K/uL  Type and screen [facility]     Status: None   Collection Time: 02/02/18  6:32 PM  Result Value Ref Range   ABO/RH(D) A POS    Antibody Screen NEG    Sample Expiration      02/05/2018 Performed at [facility], [address] [place], [place] [address]    Group B Strep negative  Assessment   G3P1011 at [redacted]w[redacted]d Estimated Date of Delivery: 02/04/18  The fetus is reassuring.   Patient Active Problem List   Diagnosis Date Noted  .  Normal labor 02/02/2018  . Pregnancy 01/07/2018  . Anemia of pregnancy in third trimester 12/22/2017  . Heartburn in pregnancy in third trimester 12/22/2017  . Nausea and vomiting in pregnancy 11/05/2017  . History of postpartum hemorrhage 09/17/2017  . 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 Fall from standing 11/23/2011  . Edema or excessive weight gain, antepartum 10/14/2011  . Anemia 10/09/2011  . Abnormal Pap smear and cervical HPV (human papillomavirus) 10/09/2011    Plan  1. Admit to L&D :   expectant management 2. EFM: -- Category 1 3. Epidural if desired. Stadol for IV pain until epidural requested. 4. Admission labs    Elonda Husky, M.D. 02/02/2018 10:35 PM

## 2018-02-03 MED ORDER — IBUPROFEN 600 MG PO TABS
600.0000 mg | ORAL_TABLET | Freq: Four times a day (QID) | ORAL | Status: DC
  Administered 2018-02-03 – 2018-02-04 (×4): 600 mg via ORAL
  Filled 2018-02-03 (×4): qty 1

## 2018-02-03 MED ORDER — OXYTOCIN 40 UNITS IN LACTATED RINGERS INFUSION - SIMPLE MED: 2.5000 [IU]/h | INTRAVENOUS | Status: DC | PRN

## 2018-02-03 MED ORDER — METHYLERGONOVINE MALEATE 0.2 MG PO TABS: 0.2000 mg | ORAL_TABLET | Freq: Four times a day (QID) | ORAL | Status: AC

## 2018-02-03 MED ORDER — OXYCODONE-ACETAMINOPHEN 5-325 MG PO TABS
1.0000 | ORAL_TABLET | ORAL | Status: DC | PRN
  Administered 2018-02-03: 1 via ORAL
  Filled 2018-02-03: qty 1

## 2018-02-03 MED ORDER — IBUPROFEN 600 MG PO TABS
600.0000 mg | ORAL_TABLET | Freq: Four times a day (QID) | ORAL | Status: DC
  Administered 2018-02-03: 600 mg via ORAL
  Filled 2018-02-03: qty 1

## 2018-02-03 MED ORDER — METHYLERGONOVINE MALEATE 0.2 MG/ML IJ SOLN
0.2000 mg | Freq: Once | INTRAMUSCULAR | Status: DC
Start: 2018-02-03 — End: 2018-02-04

## 2018-02-03 MED ORDER — ZOLPIDEM TARTRATE 5 MG PO TABS: 5.0000 mg | ORAL_TABLET | Freq: Every evening | ORAL | Status: DC | PRN

## 2018-02-03 MED ORDER — TETANUS-DIPHTH-ACELL PERTUSSIS 5-2.5-18.5 LF-MCG/0.5 IM SUSP: 0.5000 mL | Freq: Once | INTRAMUSCULAR | Status: DC

## 2018-02-03 MED ORDER — FAMOTIDINE 20 MG PO TABS
20.0000 mg | ORAL_TABLET | Freq: Two times a day (BID) | ORAL | Status: DC
  Administered 2018-02-03 – 2018-02-04 (×3): 20 mg via ORAL
  Filled 2018-02-03 (×3): qty 1

## 2018-02-03 MED ORDER — SIMETHICONE 80 MG PO CHEW: 80.0000 mg | CHEWABLE_TABLET | ORAL | Status: DC | PRN

## 2018-02-03 MED ORDER — DIPHENHYDRAMINE HCL 25 MG PO CAPS: 25.0000 mg | ORAL_CAPSULE | Freq: Four times a day (QID) | ORAL | Status: DC | PRN

## 2018-02-03 MED ORDER — PRENATAL MULTIVITAMIN CH
1.0000 | ORAL_TABLET | Freq: Every day | ORAL | Status: DC
  Administered 2018-02-03: 1 via ORAL
  Filled 2018-02-03: qty 1

## 2018-02-03 MED ORDER — BENZOCAINE-MENTHOL 20-0.5 % EX AERO: 1.0000 "application " | INHALATION_SPRAY | CUTANEOUS | Status: DC | PRN

## 2018-02-03 MED ORDER — ACETAMINOPHEN 325 MG PO TABS
650.0000 mg | ORAL_TABLET | ORAL | Status: DC | PRN
  Administered 2018-02-04: 650 mg via ORAL
  Filled 2018-02-03: qty 2

## 2018-02-03 MED ORDER — DOCUSATE SODIUM 100 MG PO CAPS
100.0000 mg | ORAL_CAPSULE | Freq: Two times a day (BID) | ORAL | Status: DC
  Administered 2018-02-03 – 2018-02-04 (×3): 100 mg via ORAL
  Filled 2018-02-03 (×3): qty 1

## 2018-02-03 NOTE — Plan of Care (Signed)
Vs stable; still needs to void postpartum; has been up to attempt twice; continue to monitor this; fundal check is u/-1 and bleeding is small amount with no trickling of blood and no blood clots

## 2018-02-04 ENCOUNTER — Encounter: Payer: Medicaid Other | Admitting: Obstetrics and Gynecology

## 2018-02-04 MED ORDER — IBUPROFEN 600 MG PO TABS
600.0000 mg | ORAL_TABLET | Freq: Four times a day (QID) | ORAL | Status: DC
  Administered 2018-02-04: 600 mg via ORAL
  Filled 2018-02-04: qty 1

## 2018-02-04 NOTE — Discharge Summary (Signed)
                              Discharge Summary  Date of Admission: 02/02/2018  Date of Discharge: 02/04/2018  Admitting Diagnosis: Onset of Labor at [redacted]w[redacted]d  Mode of Delivery: vaginal delivery                 Discharge Diagnosis: No other diagnosis   Intrapartum Procedures: pitocin augmentation   Post partum procedures:   Complications: none                      Discharge Day SOAP Note:  Progress Note - Vaginal Delivery  Kimberly Dunn is a 27 y.o. [id number] now PP day 1 s/p Vaginal, Spontaneous . Delivery was uncomplicated  Subjective  The patient has the following complaints: has no unusual complaints  Pain is controlled with current medications.   Patient is urinating without difficulty.  She is ambulating well.    Objective  Vital signs: BP 113/69 (BP Location: Left Arm)   Pulse 62   Temp 98.1 F (36.7 C) (Oral)   Resp 18   Ht  (1.727 m)   Wt 230 lb (104.3 kg)   LMP 04/28/2017   SpO2 99%   Breastfeeding? Unknown   BMI 34.97 kg/m   Physical Exam: Gen: NAD Fundus Fundal Tone: Firm  Lochia Amount: Small  Perineum Appearance: Intact     Data Review Labs: CBC Latest Ref Rng & Units 02/02/2018 11/12/2017 08/27/2017  WBC 3.6 - 11.0 K/uL 6.5 7.7 7.1  Hemoglobin 12.0 - 16.0 g/dL 10.6(L) 10.8(L) 11.8  Hematocrit 35.0 - 47.0 % 31.3(L) 32.9(L) 35.0  Platelets 150 - 440 K/uL 246 300 -   A POS  Assessment/Plan  Active Problems:   Pregnancy   Normal labor    Plan for discharge today.   Discharge Instructions: Per After Visit Summary. Activity: Advance as tolerated. Pelvic rest for 6 weeks.  Also refer to After Visit Summary Diet: Regular Medications: Allergies as of 02/04/2018   No Known Allergies     Medication List    STOP taking these medications   doxylamine (Sleep) 25 MG tablet Commonly known as:  UNISOM   ranitidine 150 MG tablet Commonly known as:  ZANTAC     TAKE these medications   acetaminophen 325 MG tablet Commonly known  as:  TYLENOL Take 650 mg by mouth every 6 (six) hours as needed.   prenatal multivitamin Tabs tablet Take 1 tablet by mouth daily at 12 noon.      Outpatient follow up:  Follow-up Information    Linzie Collin, MD Follow up in 6 week(s).   Specialty:  Obstetrics and Gynecology Contact information: [address] [place] [address] [phone]          Postpartum contraception: Will discuss at first office visit post-partum  Discharged Condition: good  Discharged to: home  Newborn Data: Disposition:home with mother  Apgars: APGAR (1 MIN): 9   APGAR (5 MINS): 9   APGAR (10 MINS):    Baby Feeding: Breast    Elonda Husky, M.D. 02/04/2018 10:02 AM

## 2018-02-04 NOTE — Progress Notes (Signed)
Patient discharged home with infant. Discharge instructions, prescriptions and follow up appointment given to and reviewed with patient. Patient verbalized understanding. Patient wheeled out with infant by auxiliary.  

## 2018-02-04 NOTE — Plan of Care (Signed)
Vs stable; up ad lib; tolerating regular diet; taking motrin for pain control (and occasionally a percocet for pain control); breastfeeding

## 2018-02-05 LAB — RPR, QUANT+TP ABS (REFLEX)
Rapid Plasma Reagin, Quant: 1:8 {titer} — ABNORMAL HIGH
T Pallidum Abs: POSITIVE — AB

## 2018-02-05 LAB — RPR: RPR Ser Ql: REACTIVE — AB

## 2018-03-24 ENCOUNTER — Ambulatory Visit (INDEPENDENT_AMBULATORY_CARE_PROVIDER_SITE_OTHER): Payer: Medicaid Other | Admitting: Obstetrics and Gynecology

## 2018-03-24 ENCOUNTER — Encounter: Payer: Self-pay | Admitting: Obstetrics and Gynecology

## 2018-03-24 DIAGNOSIS — Z30011 Encounter for initial prescription of contraceptive pills: Secondary | ICD-10-CM

## 2018-03-24 MED ORDER — NORETHINDRONE 0.35 MG PO TABS: 1.0000 | ORAL_TABLET | Freq: Every day | ORAL | 1 refills | Status: DC

## 2018-03-24 NOTE — Progress Notes (Signed)
HPI:      Ms. Kimberly Dunn is a 27 y.o. [id number] who LMP was No LMP recorded.  Subjective:   She presents today 6 weeks postpartum.  She is breast-feeding full-time.  She has had intercourse without difficulty.  She desires birth control.  She is not sure which method she would like.    Hx: The following portions of the patient's history were reviewed and updated as appropriate:             She  has a past medical history of Abnormal Pap smear, Anemia, and HPV (human papilloma virus) infection. She does not have any pertinent problems on file. She  has a past surgical history that includes Tonsillectomy; Cholecystectomy; and Condyloma excision/fulguration. Her family history includes Anemia in her sister; Depression in her sister; Diabetes in her mother; Hypertension in her father and sister. She  reports that she has never smoked. She has never used smokeless tobacco. She reports that she does not drink alcohol or use drugs. She has a current medication list which includes the following prescription(s): prenatal multivitamin, acetaminophen, and sodium chloride. She has No Known Allergies.       Review of Systems:  Review of Systems  Constitutional: Denied constitutional symptoms, night sweats, recent illness, fatigue, fever, insomnia and weight loss.  Eyes: Denied eye symptoms, eye pain, photophobia, vision change and visual disturbance.  Ears/Nose/Throat/Neck: Denied ear, nose, throat or neck symptoms, hearing loss, nasal discharge, sinus congestion and sore throat.  Cardiovascular: Denied cardiovascular symptoms, arrhythmia, chest pain/pressure, edema, exercise intolerance, orthopnea and palpitations.  Respiratory: Denied pulmonary symptoms, asthma, pleuritic pain, productive sputum, cough, dyspnea and wheezing.  Gastrointestinal: Denied, gastro-esophageal reflux, melena, nausea and vomiting.  Genitourinary: Denied genitourinary symptoms including symptomatic vaginal discharge,  pelvic relaxation issues, and urinary complaints.  Musculoskeletal: Denied musculoskeletal symptoms, stiffness, swelling, muscle weakness and myalgia.  Dermatologic: Denied dermatology symptoms, rash and scar.  Neurologic: Denied neurology symptoms, dizziness, headache, neck pain and syncope.  Psychiatric: Denied psychiatric symptoms, anxiety and depression.  Endocrine: Denied endocrine symptoms including hot flashes and night sweats.   Meds:   Current Outpatient Medications on File Prior to Visit  Medication Sig Dispense Refill  . Prenatal Vit-Fe Fumarate-FA (PRENATAL MULTIVITAMIN) TABS tablet Take 1 tablet by mouth daily at 12 noon.    Marland Kitchen acetaminophen (TYLENOL) 325 MG tablet Take 650 mg by mouth every 6 (six) hours as needed.    . [DISCONTINUED] sodium chloride (OCEAN NASAL SPRAY) 0.65 % nasal spray Place 1 spray into the nose as needed for congestion. 45 mL 3   No current facility-administered medications on file prior to visit.     Objective:     Vitals:   03/24/18 1111  BP: 112/75  Pulse: 60              Pelvic examination   Pelvic:   Vulva: Normal appearance.  No lesions.  No abnormal scarring.    Vagina: No lesions or abnormalities noted.  Support: Normal pelvic support.  Urethra No masses tenderness or scarring.  Meatus Normal size without lesions or prolapse.  Cervix: Normal ectropion.  No lesions.  Anus: Normal exam.  No lesions.  Perineum: Normal exam.  No lesions.  Healed well.          Bimanual   Uterus: Normal size.  Non-tender.  Mobile.  AV.  Adnexae: No masses.  Non-tender to palpation.  Cul-de-sac: Negative for abnormality.     Assessment:    [id number] Patient Active Problem  List   Diagnosis Date Noted  . Normal labor 02/02/2018  . Pregnancy 01/07/2018  . Anemia of pregnancy in third trimester 12/22/2017  . Heartburn in pregnancy in third trimester 12/22/2017  . Nausea and vomiting in pregnancy 11/05/2017  . History of postpartum hemorrhage  09/17/2017  . 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 Fall from standing 11/23/2011  . Edema or excessive weight gain, antepartum 10/14/2011  . Anemia 10/09/2011  . Abnormal Pap smear and cervical HPV (human papillomavirus) 10/09/2011     1. Postpartum care and examination immediately after delivery   2. Initiation of OCP (BCP)        Plan:            1.  Patient may resume normal activities with exception of heavy lifting.  2.  Breastfeeding and Birth Control Breastfeeding and birth control were discussed in detail.  The patient understands that breastfeeding is not a reliable form of birth control.  We have discussed the use of Progesterone-only birth control pills and combination OCPs.  I have informed her that Progesterone only pills are safe with breastfeeding and very effective when used in combination with full-time breastfeeding.  I have stressed that when she begins to wean from breastfeeding, Progesterone-only pills become less effective and switching to another form of birth control is recommended.  We have also discussed the use of combination OCPs with breastfeeding.  I have informed her that OCPs are safe with breastfeeding even though a small amount of the drug is carried in breast milk.  We have discussed the fact that combination OCPs can reduce the amount of breast milk produced.  I have made her aware that in rare instances this can lead to discontinuation of breastfeeding.  The use of Mirena IUD was discussed and she has been made aware that this is safe with breastfeeding.  All her questions were answered and she was given appropriate literature on birth control methods. She has elected to begin Micronor.  Orders No orders of the defined types were placed in this encounter.   No orders of the defined types were placed in this encounter.     F/U  Return in about 3 months (around 06/24/2018).  Elonda Huskyavid J. Helen Winterhalter, M.D. 03/24/2018 11:47 AM

## 2018-06-24 ENCOUNTER — Encounter: Payer: Medicaid Other | Admitting: Obstetrics and Gynecology

## 2018-09-29 ENCOUNTER — Other Ambulatory Visit: Payer: Self-pay | Admitting: Obstetrics and Gynecology

## 2018-09-29 DIAGNOSIS — Z30011 Encounter for initial prescription of contraceptive pills: Secondary | ICD-10-CM

## 2018-11-13 ENCOUNTER — Other Ambulatory Visit: Payer: Self-pay

## 2018-11-13 DIAGNOSIS — J209 Acute bronchitis, unspecified: Secondary | ICD-10-CM | POA: Diagnosis not present

## 2018-11-13 DIAGNOSIS — J309 Allergic rhinitis, unspecified: Secondary | ICD-10-CM | POA: Diagnosis not present

## 2018-11-13 DIAGNOSIS — R0600 Dyspnea, unspecified: Secondary | ICD-10-CM | POA: Diagnosis not present

## 2018-11-13 DIAGNOSIS — E559 Vitamin D deficiency, unspecified: Secondary | ICD-10-CM | POA: Diagnosis not present

## 2018-11-13 DIAGNOSIS — J45909 Unspecified asthma, uncomplicated: Secondary | ICD-10-CM | POA: Diagnosis not present

## 2018-11-13 NOTE — Telephone Encounter (Signed)
Encounter opened in error, patient called wanting refill from Dr. Talbot Grumbling, who is not a physician at this practice. KW

## 2018-12-11 DIAGNOSIS — J309 Allergic rhinitis, unspecified: Secondary | ICD-10-CM | POA: Diagnosis not present

## 2018-12-11 DIAGNOSIS — D649 Anemia, unspecified: Secondary | ICD-10-CM | POA: Diagnosis not present

## 2018-12-11 DIAGNOSIS — Z79899 Other long term (current) drug therapy: Secondary | ICD-10-CM | POA: Diagnosis not present

## 2018-12-11 DIAGNOSIS — Z1331 Encounter for screening for depression: Secondary | ICD-10-CM | POA: Diagnosis not present

## 2018-12-11 DIAGNOSIS — E559 Vitamin D deficiency, unspecified: Secondary | ICD-10-CM | POA: Diagnosis not present

## 2018-12-11 DIAGNOSIS — J45909 Unspecified asthma, uncomplicated: Secondary | ICD-10-CM | POA: Diagnosis not present

## 2019-05-31 ENCOUNTER — Emergency Department: Payer: Medicaid Other

## 2019-05-31 ENCOUNTER — Emergency Department
Admission: EM | Admit: 2019-05-31 | Discharge: 2019-06-01 | Disposition: A | Discharge status: Home / Self Care | Payer: Medicaid Other | Attending: Emergency Medicine | Admitting: Emergency Medicine

## 2019-05-31 ENCOUNTER — Other Ambulatory Visit: Payer: Self-pay

## 2019-05-31 DIAGNOSIS — K529 Noninfective gastroenteritis and colitis, unspecified: Secondary | ICD-10-CM | POA: Diagnosis not present

## 2019-05-31 DIAGNOSIS — Z3A01 Less than 8 weeks gestation of pregnancy: Secondary | ICD-10-CM | POA: Diagnosis not present

## 2019-05-31 DIAGNOSIS — R197 Diarrhea, unspecified: Secondary | ICD-10-CM | POA: Diagnosis present

## 2019-05-31 DIAGNOSIS — R102 Pelvic and perineal pain: Secondary | ICD-10-CM

## 2019-05-31 DIAGNOSIS — O26891 Other specified pregnancy related conditions, first trimester: Secondary | ICD-10-CM | POA: Insufficient documentation

## 2019-05-31 DIAGNOSIS — O218 Other vomiting complicating pregnancy: Secondary | ICD-10-CM | POA: Insufficient documentation

## 2019-05-31 DIAGNOSIS — R109 Unspecified abdominal pain: Secondary | ICD-10-CM | POA: Insufficient documentation

## 2019-05-31 DIAGNOSIS — O99611 Diseases of the digestive system complicating pregnancy, first trimester: Secondary | ICD-10-CM | POA: Diagnosis not present

## 2019-05-31 LAB — WET PREP, GENITAL
Clue Cells Wet Prep HPF POC: NONE SEEN
Sperm: NONE SEEN
Trich, Wet Prep: NONE SEEN
Yeast Wet Prep HPF POC: NONE SEEN

## 2019-05-31 LAB — CBC
HCT: 34.9 % — ABNORMAL LOW (ref 36.0–46.0)
Hemoglobin: 11.8 g/dL — ABNORMAL LOW (ref 12.0–15.0)
MCH: 29.6 pg (ref 26.0–34.0)
MCHC: 33.8 g/dL (ref 30.0–36.0)
MCV: 87.5 fL (ref 80.0–100.0)
Platelets: 255 10*3/uL (ref 150–400)
RBC: 3.99 MIL/uL (ref 3.87–5.11)
RDW: 13 % (ref 11.5–15.5)
WBC: 7.8 10*3/uL (ref 4.0–10.5)
nRBC: 0 % (ref 0.0–0.2)

## 2019-05-31 LAB — URINALYSIS, COMPLETE (UACMP) WITH MICROSCOPIC
Bacteria, UA: NONE SEEN
Bilirubin Urine: NEGATIVE
Glucose, UA: NEGATIVE mg/dL
Hgb urine dipstick: NEGATIVE
Ketones, ur: NEGATIVE mg/dL
Leukocytes,Ua: NEGATIVE
Nitrite: NEGATIVE
Protein, ur: NEGATIVE mg/dL
Specific Gravity, Urine: 1.027 (ref 1.005–1.030)
pH: 6 (ref 5.0–8.0)

## 2019-05-31 LAB — COMPREHENSIVE METABOLIC PANEL
ALT: 8 U/L (ref 0–44)
AST: 12 U/L — ABNORMAL LOW (ref 15–41)
Albumin: 4.2 g/dL (ref 3.5–5.0)
Alkaline Phosphatase: 44 U/L (ref 38–126)
Anion gap: 8 (ref 5–15)
BUN: 10 mg/dL (ref 6–20)
CO2: 26 mmol/L (ref 22–32)
Calcium: 9.3 mg/dL (ref 8.9–10.3)
Chloride: 103 mmol/L (ref 98–111)
Creatinine, Ser: 0.76 mg/dL (ref 0.44–1.00)
GFR calc Af Amer: >60 mL/min (ref >60)
GFR calc non Af Amer: >60 mL/min (ref >60)
Glucose, Bld: 101 mg/dL — ABNORMAL HIGH (ref 70–99)
Potassium: 3.7 mmol/L (ref 3.5–5.1)
Sodium: 137 mmol/L (ref 135–145)
Total Bilirubin: 0.6 mg/dL (ref 0.3–1.2)
Total Protein: 7 g/dL (ref 6.5–8.1)

## 2019-05-31 LAB — HCG, QUANTITATIVE, PREGNANCY: hCG, Beta Chain, Quant, S: 43054 m[IU]/mL — ABNORMAL HIGH (ref <5)

## 2019-05-31 LAB — POCT PREGNANCY, URINE: Preg Test, Ur: POSITIVE — AB

## 2019-05-31 LAB — LIPASE, BLOOD: Lipase: 37 U/L (ref 11–51)

## 2019-05-31 MED ORDER — PROMETHAZINE HCL 12.5 MG PO TABS: 12.5000 mg | ORAL_TABLET | Freq: Four times a day (QID) | ORAL | 0 refills | Status: DC | PRN

## 2019-05-31 MED ORDER — PROMETHAZINE HCL 25 MG PO TABS
25.0000 mg | ORAL_TABLET | Freq: Once | ORAL | Status: AC
  Administered 2019-05-31: 25 mg via ORAL
  Filled 2019-05-31: qty 1

## 2019-05-31 MED ORDER — ONDANSETRON 4 MG PO TBDP: 4.0000 mg | ORAL_TABLET | Freq: Once | ORAL | Status: DC

## 2019-05-31 NOTE — ED Notes (Signed)
Patient transported to Ultrasound 

## 2019-05-31 NOTE — ED Provider Notes (Signed)
[facility] Emergency Department Provider Note   ____________________________________________   First MD Initiated Contact with Patient 05/31/19 2138     (approximate)  I have reviewed the triage vital signs and the nursing notes.   HISTORY  Chief Complaint Diarrhea    HPI Kimberly Dunn is a 28 y.o. female with no significant past medical history who presents to the ED complaining of abdominal pain.  Patient reports she has had approximately 3 days of bilateral upper quadrant abdominal discomfort associated with vomiting and diarrhea.  She states she has not had any fever, cough, chest pain, shortness of breath, flank pain, dysuria, or hematuria.  She is unsure of her last menstrual period as it has been irregular.  She has had not had any vaginal bleeding or discharge.        Past Medical History:  Diagnosis Date  . Abnormal Pap smear   . Anemia   . HPV (human papilloma virus) infection     Patient Active Problem List   Diagnosis Date Noted  . Normal labor 02/02/2018  . Pregnancy 01/07/2018  . Anemia of pregnancy in third trimester 12/22/2017  . Heartburn in pregnancy in third trimester 12/22/2017  . Nausea and vomiting in pregnancy 11/05/2017  . History of postpartum hemorrhage 09/17/2017  . 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 Fall from standing 11/23/2011  . Edema or excessive weight gain, antepartum 10/14/2011  . Anemia 10/09/2011  . Abnormal Pap smear and cervical HPV (human papillomavirus) 10/09/2011    Past Surgical History:  Procedure Laterality Date  . CHOLECYSTECTOMY    . CONDYLOMA EXCISION/FULGURATION    . TONSILLECTOMY      Prior to Admission medications   Medication Sig Start Date End Date Taking? Authorizing Provider  acetaminophen (TYLENOL) 325 MG tablet Take 650 mg by mouth every 6 (six) hours as needed.    [provider]  norethindrone (MICRONOR,CAMILA,ERRIN) 0.35 MG tablet Take 1 tablet  (0.35 mg total) by mouth daily. 03/24/18   Linzie Collin, MD  Prenatal Vit-Fe Fumarate-FA (PRENATAL MULTIVITAMIN) TABS tablet Take 1 tablet by mouth daily at 12 noon.    [provider]  promethazine (PHENERGAN) 12.5 MG tablet Take 1 tablet (12.5 mg total) by mouth every 6 (six) hours as needed for nausea or vomiting. 05/31/19   Chesley Noon, MD  sodium chloride (OCEAN NASAL SPRAY) 0.65 % nasal spray Place 1 spray into the nose as needed for congestion. 10/07/11 11/21/11  Adam Phenix, MD    Allergies Patient has no known allergies.  Family History  Problem Relation Age of Onset  . Diabetes Mother   . Hypertension Father   . Hypertension Sister   . Anemia Sister   . Depression Sister   . Anesthesia problems Neg Hx     Social History Social History   Tobacco Use  . Smoking status: Never Smoker  . Smokeless tobacco: Never Used  Substance Use Topics  . Alcohol use: No  . Drug use: No    Review of Systems  Constitutional: No fever/chills Eyes: No visual changes. ENT: No sore throat. Cardiovascular: Denies chest pain. Respiratory: Denies shortness of breath. Gastrointestinal: Positive for abdominal pain, vomiting, and diarrhea.  No constipation. Genitourinary: Negative for dysuria. Musculoskeletal: Negative for back pain. Skin: Negative for rash. Neurological: Negative for headaches, focal weakness or numbness.  ____________________________________________   PHYSICAL EXAM:  VITAL SIGNS: ED Triage Vitals  Enc Vitals Group     BP 05/31/19 2021 121/70  Pulse Rate 05/31/19 2021 85     Resp 05/31/19 2021 20     Temp 05/31/19 2021 98.5 F (36.9 C)     Temp Source 05/31/19 2021 Oral     SpO2 05/31/19 2021 99 %     Weight 05/31/19 2022 190 lb (86.2 kg)     Height 05/31/19 2022 5\' 8"  (1.727 m)     Head Circumference --      Peak Flow --      Pain Score 05/31/19 2022 6     Pain Loc --      Pain Edu? --      Excl. in GC? --     Constitutional:  Alert and oriented. Eyes: Conjunctivae are normal. Head: Atraumatic. Nose: No congestion/rhinnorhea. Mouth/Throat: Mucous membranes are moist. Neck: Normal ROM Cardiovascular: Normal rate, regular rhythm. Grossly normal heart sounds. Respiratory: Normal respiratory effort.  No retractions. Lungs CTAB. Gastrointestinal: Soft and nontender. No distention. Genitourinary: No vaginal bleeding or discharge noted, cervical os closed, no cervical motion or Tenderness. Musculoskeletal: No lower extremity tenderness nor edema. Neurologic:  Normal speech and language. No gross focal neurologic deficits are appreciated. Skin:  Skin is warm, dry and intact. No rash noted. Psychiatric: Mood and affect are normal. Speech and behavior are normal.  ____________________________________________   LABS (all labs ordered are listed, but only abnormal results are displayed)  Labs Reviewed  WET PREP, GENITAL - Abnormal; Notable for the following components:      Result Value   WBC, Wet Prep HPF POC MODERATE (*)    All other components within normal limits  CBC - Abnormal; Notable for the following components:   Hemoglobin 11.8 (*)    HCT 34.9 (*)    All other components within normal limits  COMPREHENSIVE METABOLIC PANEL - Abnormal; Notable for the following components:   Glucose, Bld 101 (*)    AST 12 (*)    All other components within normal limits  URINALYSIS, COMPLETE (UACMP) WITH MICROSCOPIC - Abnormal; Notable for the following components:   Color, Urine YELLOW (*)    APPearance CLEAR (*)    All other components within normal limits  HCG, QUANTITATIVE, PREGNANCY - Abnormal; Notable for the following components:   hCG, Beta Chain, Quant, S 43,054 (*)    All other components within normal limits  POCT PREGNANCY, URINE - Abnormal; Notable for the following components:   Preg Test, Ur POSITIVE (*)    All other components within normal limits  LIPASE, BLOOD  POC URINE PREG, ED      PROCEDURES  Procedure(s) performed (including Critical Care):  Procedures   ____________________________________________   INITIAL IMPRESSION / ASSESSMENT AND PLAN / ED COURSE       28 year old female presenting to the ED with 3 days of upper abdominal pain associated with vomiting and diarrhea.  She has a benign and nontender abdominal exam, pelvic exam is unremarkable.  Urine pregnancy is positive, patient is unsure of her last menstrual period.  Will check ultrasound and beta-hCG, no bleeding to necessitate RhoGam.  UA without evidence of infection and remainder of labs unremarkable.  Do not suspect biliary pathology as patient is status post cholecystectomy.  Patient turned over to Dr. York CeriseForbach pending ultrasound.  If this shows intrauterine pregnancy, suspect gastroenteritis versus morning sickness.      ____________________________________________   FINAL CLINICAL IMPRESSION(S) / ED DIAGNOSES  Final diagnoses:  Abdominal pain during pregnancy in first trimester  Gastroenteritis     ED Discharge Orders  Ordered    promethazine (PHENERGAN) 12.5 MG tablet  Every 6 hours PRN     05/31/19 2313           Note:  This document was prepared using Dragon voice recognition software and may include unintentional dictation errors.   Blake Divine, MD 06/01/19 [phone]

## 2019-05-31 NOTE — Discharge Instructions (Addendum)
As we discussed, your work-up was generally reassuring tonight, and your symptoms may be the result of a viral illness.  However, they could be also the result of your early pregnancy.  We recommend you take the over-the-counter nausea medicine as needed, drink plenty of fluids to stay hydrated, and start taking prenatal vitamins.  Schedule a follow-up appointment for prenatal care with OB/GYN at the next available opportunity.  Insert return statement

## 2019-05-31 NOTE — ED Triage Notes (Signed)
Pt in with co upper abd pain and diarrhea x 3 days with loss of appetite. Has had nausea but no vomiting.

## 2019-05-31 NOTE — ED Provider Notes (Signed)
-----------------------------------------   11:01 PM on 05/31/2019 -----------------------------------------  Assuming care from Dr. Charna Archer.  In short, Kimberly Dunn is a 28 y.o. female with a chief complaint of upper abd pain and diarrhea with nausea.  New pregnancy diagnosis tonight.  Refer to the original H&P for additional details.  The current plan of care is to follow up HCG and ultrasound.  Anticipate discharge with outpatient (prenatal) follow up.   ----------------------------------------- 1:58 AM on 06/01/2019 -----------------------------------------  Ultrasound reassuring showing a single intrauterine early pregnancy and no other acute abnormalities.  As per Dr. Grover Canavan plan I am discharging for outpatient follow-up for likely viral gastroenteritis and/or symptoms secondary to her early pregnancy.  I updated the patient and she understands and agrees.  Benign abdomen at this time.  She has seen [facility] in the past for prior children and will schedule a follow-up appointment.   Hinda Kehr, MD 06/01/19 [phone]

## 2019-06-01 ENCOUNTER — Encounter: Payer: Self-pay | Admitting: Radiology

## 2019-06-01 DIAGNOSIS — Z3A01 Less than 8 weeks gestation of pregnancy: Secondary | ICD-10-CM | POA: Diagnosis not present

## 2019-06-01 DIAGNOSIS — R102 Pelvic and perineal pain: Secondary | ICD-10-CM | POA: Diagnosis not present

## 2019-06-01 DIAGNOSIS — O26891 Other specified pregnancy related conditions, first trimester: Secondary | ICD-10-CM | POA: Diagnosis not present

## 2019-06-28 ENCOUNTER — Other Ambulatory Visit: Payer: Self-pay

## 2019-06-28 ENCOUNTER — Ambulatory Visit (INDEPENDENT_AMBULATORY_CARE_PROVIDER_SITE_OTHER): Payer: Medicaid Other | Admitting: Obstetrics and Gynecology

## 2019-06-28 VITALS — BP 109/74 | HR 76 | Wt 197.4 lb

## 2019-06-28 DIAGNOSIS — Z3491 Encounter for supervision of normal pregnancy, unspecified, first trimester: Secondary | ICD-10-CM

## 2019-06-28 MED ORDER — DOXYLAMINE-PYRIDOXINE 10-10 MG PO TBEC: 2.0000 | DELAYED_RELEASE_TABLET | Freq: Every day | ORAL | 5 refills | Status: DC

## 2019-06-28 NOTE — Patient Instructions (Signed)
Morning Sickness  Morning sickness is when you feel sick to your stomach (nauseous) during pregnancy. You may feel sick to your stomach and throw up (vomit). You may feel sick in the morning, but you can feel this way at any time of day. Some women feel very sick to their stomach and cannot stop throwing up (hyperemesis gravidarum). Follow these instructions at home: Medicines  Take over-the-counter and prescription medicines only as told by your doctor. Do not take any medicines until you talk with your doctor about them first.  Taking multivitamins before getting pregnant can stop or lessen the harshness of morning sickness. Eating and drinking  Eat dry toast or crackers before getting out of bed.  Eat 5 or 6 small meals a day.  Eat dry and bland foods like rice and baked potatoes.  Do not eat greasy, fatty, or spicy foods.  Have someone cook for you if the smell of food causes you to feel sick or throw up.  If you feel sick to your stomach after taking prenatal vitamins, take them at night or with a snack.  Eat protein when you need a snack. Nuts, yogurt, and cheese are good choices.  Drink fluids throughout the day.  Try ginger ale made with real ginger, ginger tea made from fresh grated ginger, or ginger candies. General instructions  Do not use any products that have nicotine or tobacco in them, such as cigarettes and e-cigarettes. If you need help quitting, ask your doctor.  Use an air purifier to keep the air in your house free of smells.  Get lots of fresh air.  Try to avoid smells that make you feel sick.  Try: ? Wearing a bracelet that is used for seasickness (acupressure wristband). ? Going to a doctor who puts thin needles into certain body points (acupuncture) to improve how you feel. Contact a doctor if:  You need medicine to feel better.  You feel dizzy or light-headed.  You are losing weight. Get help right away if:  You feel very sick to your  stomach and cannot stop throwing up.  You pass out (faint).  You have very bad pain in your belly. Summary  Morning sickness is when you feel sick to your stomach (nauseous) during pregnancy.  You may feel sick in the morning, but you can feel this way at any time of day.  Making some changes to what you eat may help your symptoms go away. This information is not intended to replace advice given to you by your health care provider. Make sure you discuss any questions you have with your health care provider. Document Released: 10/03/2004 Document Revised: 08/08/2017 Document Reviewed: 09/26/2016 Elsevier Patient Education  2020 [employer]. How a Baby Grows During Pregnancy  Pregnancy begins when a female's sperm enters a female's egg (fertilization). Fertilization usually happens in one of the tubes (fallopian tubes) that connect the ovaries to the womb (uterus). The fertilized egg moves down the fallopian tube to the uterus. Once it reaches the uterus, it implants into the lining of the uterus and begins to grow. For the first 10 weeks, the fertilized egg is called an embryo. After 10 weeks, it is called a fetus. As the fetus continues to grow, it receives oxygen and nutrients through tissue (placenta) that grows to support the developing baby. The placenta is the life support system for the baby. It provides oxygen and nutrition and removes waste. Learning as much as you can about your pregnancy and  how your baby is developing can help you enjoy the experience. It can also make you aware of when there might be a problem and when to ask questions. How long does a typical pregnancy last? A pregnancy usually lasts 280 days, or about 40 weeks. Pregnancy is divided into three periods of growth, also called trimesters:  First trimester: 0-12 weeks.  Second trimester: 13-27 weeks.  Third trimester: 28-40 weeks. The day when your baby is ready to be born (full term) is your estimated date of  delivery. How does my baby develop month by month? First month  The fertilized egg attaches to the inside of the uterus.  Some cells will form the placenta. Others will form the fetus.  The arms, legs, brain, spinal cord, lungs, and heart begin to develop.  At the end of the first month, the heart begins to beat. Second month  The bones, inner ear, eyelids, hands, and feet form.  The genitals develop.  By the end of 8 weeks, all major organs are developing. Third month  All of the internal organs are forming.  Teeth develop below the gums.  Bones and muscles begin to grow. The spine can flex.  The skin is transparent.  Fingernails and toenails begin to form.  Arms and legs continue to grow longer, and hands and feet develop.  The fetus is about 3 inches (7.6 cm) long. Fourth month  The placenta is completely formed.  The external sex organs, neck, outer ear, eyebrows, eyelids, and fingernails are formed.  The fetus can hear, swallow, and move its arms and legs.  The kidneys begin to produce urine.  The skin is covered with a white, waxy coating (vernix) and very fine hair (lanugo). Fifth month  The fetus moves around more and can be felt for the first time (quickening).  The fetus starts to sleep and wake up and may begin to suck its finger.  The nails grow to the end of the fingers.  The organ in the digestive system that makes bile (gallbladder) functions and helps to digest nutrients.  If your baby is a girl, eggs are present in her ovaries. If your baby is a boy, testicles start to move down into his scrotum. Sixth month  The lungs are formed.  The eyes open. The brain continues to develop.  Your baby has fingerprints and toe prints. Your baby's hair grows thicker.  At the end of the second trimester, the fetus is about 9 inches (22.9 cm) long. Seventh month  The fetus kicks and stretches.  The eyes are developed enough to sense changes in  light.  The hands can make a grasping motion.  The fetus responds to sound. Eighth month  All organs and body systems are fully developed and functioning.  Bones harden, and taste buds develop. The fetus may hiccup.  Certain areas of the brain are still developing. The skull remains soft. Ninth month  The fetus gains about  lb (0.23 kg) each week.  The lungs are fully developed.  Patterns of sleep develop.  The fetus's head typically moves into a head-down position (vertex) in the uterus to prepare for birth.  The fetus weighs 6-9 lb (2.72-4.08 kg) and is 19-20 inches (48.26-50.8 cm) long. What can I do to have a healthy pregnancy and help my baby develop? General instructions  Take prenatal vitamins as directed by your health care provider. These include vitamins such as folic acid, iron, calcium, and vitamin D. They are important  for healthy development.  Take medicines only as directed by your health care provider. Read labels and ask a pharmacist or your health care provider whether over-the-counter medicines, supplements, and prescription drugs are safe to take during pregnancy.  Keep all follow-up visits as directed by your health care provider. This is important. Follow-up visits include prenatal care and screening tests. How do I know if my baby is developing well? At each prenatal visit, your health care provider will do several different tests to check on your health and keep track of your baby's development. These include:  Fundal height and position. ? Your health care provider will measure your growing belly from your pubic bone to the top of the uterus using a tape measure. ? Your health care provider will also feel your belly to determine your baby's position.  Heartbeat. ? An ultrasound in the first trimester can confirm pregnancy and show a heartbeat, depending on how far along you are. ? Your health care provider will check your baby's heart rate at every  prenatal visit.  Second trimester ultrasound. ? This ultrasound checks your baby's development. It also may show your baby's gender. What should I do if I have concerns about my baby's development? Always talk with your health care provider about any concerns that you may have about your pregnancy and your baby. Summary  A pregnancy usually lasts 280 days, or about 40 weeks. Pregnancy is divided into three periods of growth, also called trimesters.  Your health care provider will monitor your baby's growth and development throughout your pregnancy.  Follow your health care provider's recommendations about taking prenatal vitamins and medicines during your pregnancy.  Talk with your health care provider if you have any concerns about your pregnancy or your developing baby. This information is not intended to replace advice given to you by your health care provider. Make sure you discuss any questions you have with your health care provider. Document Released: 02/12/2008 Document Revised: 12/17/2018 Document Reviewed: 07/09/2017 Elsevier Patient Education  2020 [place] of Pregnancy  The first trimester of pregnancy is from week 1 until the end of week 13 (months 1 through 3). During this time, your baby will begin to develop inside you. At 6-8 weeks, the eyes and face are formed, and the heartbeat can be seen on ultrasound. At the end of 12 weeks, all the baby's organs are formed. Prenatal care is all the medical care you receive before the birth of your baby. Make sure you get good prenatal care and follow all of your doctor's instructions. Follow these instructions at home: Medicines  Take over-the-counter and prescription medicines only as told by your doctor. Some medicines are safe and some medicines are not safe during pregnancy.  Take a prenatal vitamin that contains at least 600 micrograms (mcg) of folic acid.  If you have trouble pooping (constipation), take  medicine that will make your stool soft (stool softener) if your doctor approves. Eating and drinking   Eat regular, healthy meals.  Your doctor will tell you the amount of weight gain that is right for you.  Avoid raw meat and uncooked cheese.  If you feel sick to your stomach (nauseous) or throw up (vomit): ? Eat 4 or 5 small meals a day instead of 3 large meals. ? Try eating a few soda crackers. ? Drink liquids between meals instead of during meals.  To prevent constipation: ? Eat foods that are high in fiber, like fresh fruits and  vegetables, whole grains, and beans. ? Drink enough fluids to keep your pee (urine) clear or pale yellow. Activity  Exercise only as told by your doctor. Stop exercising if you have cramps or pain in your lower belly (abdomen) or low back.  Do not exercise if it is too hot, too humid, or if you are in a place of great height (high altitude).  Try to avoid standing for long periods of time. Move your legs often if you must stand in one place for a long time.  Avoid heavy lifting.  Wear low-heeled shoes. Sit and stand up straight.  You can have sex unless your doctor tells you not to. Relieving pain and discomfort  Wear a good support bra if your breasts are sore.  Take warm water baths (sitz baths) to soothe pain or discomfort caused by hemorrhoids. Use hemorrhoid cream if your doctor says it is okay.  Rest with your legs raised if you have leg cramps or low back pain.  If you have puffy, bulging veins (varicose veins) in your legs: ? Wear support hose or compression stockings as told by your doctor. ? Raise (elevate) your feet for 15 minutes, 3-4 times a day. ? Limit salt in your food. Prenatal care  Schedule your prenatal visits by the twelfth week of pregnancy.  Write down your questions. Take them to your prenatal visits.  Keep all your prenatal visits as told by your doctor. This is important. Safety  Wear your seat belt at all  times when driving.  Make a list of emergency phone numbers. The list should include numbers for family, friends, the hospital, and police and fire departments. General instructions  Ask your doctor for a referral to a local prenatal class. Begin classes no later than at the start of month 6 of your pregnancy.  Ask for help if you need counseling or if you need help with nutrition. Your doctor can give you advice or tell you where to go for help.  Do not use hot tubs, steam rooms, or saunas.  Do not douche or use tampons or scented sanitary pads.  Do not cross your legs for long periods of time.  Avoid all herbs and alcohol. Avoid drugs that are not approved by your doctor.  Do not use any tobacco products, including cigarettes, chewing tobacco, and electronic cigarettes. If you need help quitting, ask your doctor. You may get counseling or other support to help you quit.  Avoid cat litter boxes and soil used by cats. These carry germs that can cause birth defects in the baby and can cause a loss of your baby (miscarriage) or stillbirth.  Visit your dentist. At home, brush your teeth with a soft toothbrush. Be gentle when you floss. Contact a doctor if:  You are dizzy.  You have mild cramps or pressure in your lower belly.  You have a nagging pain in your belly area.  You continue to feel sick to your stomach, you throw up, or you have watery poop (diarrhea).  You have a bad smelling fluid coming from your vagina.  You have pain when you pee (urinate).  You have increased puffiness (swelling) in your face, hands, legs, or ankles. Get help right away if:  You have a fever.  You are leaking fluid from your vagina.  You have spotting or bleeding from your vagina.  You have very bad belly cramping or pain.  You gain or lose weight rapidly.  You throw up blood.  It may look like coffee grounds.  You are around people who have [place] measles, fifth disease, or  chickenpox.  You have a very bad headache.  You have shortness of breath.  You have any kind of trauma, such as from a fall or a car accident. Summary  The first trimester of pregnancy is from week 1 until the end of week 13 (months 1 through 3).  To take care of yourself and your unborn baby, you will need to eat healthy meals, take medicines only if your doctor tells you to do so, and do activities that are safe for you and your baby.  Keep all follow-up visits as told by your doctor. This is important as your doctor will have to ensure that your baby is healthy and growing well. This information is not intended to replace advice given to you by your health care provider. Make sure you discuss any questions you have with your health care provider. Document Released: 02/12/2008 Document Revised: 12/17/2018 Document Reviewed: 09/03/2016 Elsevier Patient Education  2020 [employer]. Commonly Asked Questions During Pregnancy  Cats: A parasite can be excreted in cat feces.  To avoid exposure you need to have another person empty the little box.  If you must empty the litter box you will need to wear gloves.  Wash your hands after handling your cat.  This parasite can also be found in raw or undercooked meat so this should also be avoided.  Colds, Sore Throats, Flu: Please check your medication sheet to see what you can take for symptoms.  If your symptoms are unrelieved by these medications please call the office.  Dental Work: Most any dental work Investment banker, corporate recommends is permitted.  X-rays should only be taken during the first trimester if absolutely necessary.  Your abdomen should be shielded with a lead apron during all x-rays.  Please notify your provider prior to receiving any x-rays.  Novocaine is fine; gas is not recommended.  If your dentist requires a note from [place] prior to dental work please call the office and we will provide one for you.  Exercise: Exercise is an important part  of staying healthy during your pregnancy.  You may continue most exercises you were accustomed to prior to pregnancy.  Later in your pregnancy you will most likely notice you have difficulty with activities requiring balance like riding a bicycle.  It is important that you listen to your body and avoid activities that put you at a higher risk of falling.  Adequate rest and staying well hydrated are a must!  If you have questions about the safety of specific activities ask your provider.    Exposure to Children with illness: Try to avoid obvious exposure; report any symptoms to [place] when noted,  If you have chicken pos, red measles or mumps, you should be immune to these diseases.   Please do not take any vaccines while pregnant unless you have checked with your OB provider.  Fetal Movement: After 28 weeks we recommend you do "kick counts" twice daily.  Lie or sit down in a calm quiet environment and count your baby movements "kicks".  You should feel your baby at least 10 times per hour.  If you have not felt 10 kicks within the first hour get up, walk around and have something sweet to eat or drink then repeat for an additional hour.  If count remains less than 10 per hour notify your provider.  Fumigating: Follow your pest  control agent's advice as to how long to stay out of your home.  Ventilate the area well before re-entering.  Hemorrhoids:   Most over-the-counter preparations can be used during pregnancy.  Check your medication to see what is safe to use.  It is important to use a stool softener or fiber in your diet and to drink lots of liquids.  If hemorrhoids seem to be getting worse please call the office.   Hot Tubs:  Hot tubs Jacuzzis and saunas are not recommended while pregnant.  These increase your internal body temperature and should be avoided.  Intercourse:  Sexual intercourse is safe during pregnancy as long as you are comfortable, unless otherwise advised by your provider.  Spotting may  occur after intercourse; report any bright red bleeding that is heavier than spotting.  Labor:  If you know that you are in labor, please go to the hospital.  If you are unsure, please call the office and let us help you decide what to do.  Lifting, straining, etc:  If your job requires heavy lifting or straining please check with your provider for any limitations.  Generally, you should not lift items heavier than that you can lift simply with your hands and arms (no back muscles)  Painting:  Paint fumes do not harm your pregnancy, but may make you ill and should be avoided if possible.  Latex or water based paints have less odor than oils.  Use adequate ventilation while painting.  Permanents & Hair Color:  Chemicals in hair dyes are not recommended as they cause increase hair dryness which can increase hair loss during pregnancy.  " Highlighting" and permanents are allowed.  Dye may be absorbed differently and permanents may not hold as well during pregnancy.  Sunbathing:  Use a sunscreen, as skin burns easily during pregnancy.  Drink plenty of fluids; avoid over heating.  Tanning Beds:  Because their possible side effects are still unknown, tanning beds are not recommended.  Ultrasound Scans:  Routine ultrasounds are performed at approximately 20 weeks.  You will be able to see your baby's general anatomy an if you would like to know the gender this can usually be determined as well.  If it is questionable when you conceived you may also receive an ultrasound early in your pregnancy for dating purposes.  Otherwise ultrasound exams are not routinely performed unless there is a medical necessity.  Although you can request a scan we ask that you pay for it when conducted because insurance does not cover " patient request" scans.  Work: If your pregnancy proceeds without complications you may work until your due date, unless your physician or employer advises otherwise.  Round Ligament Pain/Pelvic  Discomfort:  Sharp, shooting pains not associated with bleeding are fairly common, usually occurring in the second trimester of pregnancy.  They tend to be worse when standing up or when you remain standing for long periods of time.  These are the result of pressure of certain pelvic ligaments called "round ligaments".  Rest, Tylenol and heat seem to be the most effective relief.  As the womb and fetus grow, they rise out of the pelvis and the discomfort improves.  Please notify the office if your pain seems different than that described.  It may represent a more serious condition.  Common Medications Safe in Pregnancy  Acne:      Constipation:  Benzoyl Peroxide     Colace  Clindamycin      Dulcolax Suppository  Topica Erythromycin     Fibercon  Salicylic Acid      Metamucil         Miralax AVOID:        Senakot   Accutane    Cough:  Retin-A       Cough Drops  Tetracycline      Phenergan w/ Codeine if Rx  Minocycline      Robitussin (Plain & DM)  Antibiotics:     Crabs/Lice:  Ceclor       RID  Cephalosporins    AVOID:  E-Mycins      Kwell  Keflex  Macrobid/Macrodantin   Diarrhea:  Penicillin      Kao-Pectate  Zithromax      Imodium AD         PUSH FLUIDS AVOID:       Cipro     Fever:  Tetracycline      Tylenol (Regular or Extra  Minocycline       Strength)  Levaquin      Extra Strength-Do not          Exceed 8 tabs/24 hrs Caffeine:        '200mg'$ /day (equiv. To 1 cup of coffee or  approx. 3 12 oz sodas)         Gas: Cold/Hayfever:       Gas-X  Benadryl      Mylicon  Claritin       Phazyme  **Claritin-D        Chlor-Trimeton    Headaches:  Dimetapp      ASA-Free Excedrin  Drixoral-Non-Drowsy     Cold Compress  Mucinex (Guaifenasin)     Tylenol (Regular or Extra  Sudafed/Sudafed-12 Hour     Strength)  **Sudafed PE Pseudoephedrine   Tylenol Cold & Sinus     Vicks Vapor Rub  Zyrtec  **AVOID if Problems With Blood Pressure         Heartburn: Avoid lying down for at  least 1 hour after meals  Aciphex      Maalox     Rash:  Milk of Magnesia     Benadryl    Mylanta       1% Hydrocortisone Cream  Pepcid  Pepcid Complete   Sleep Aids:  Prevacid      Ambien   Prilosec       Benadryl  Rolaids       Chamomile Tea  Tums (Limit 4/day)     Unisom  Zantac       Tylenol PM         Warm milk-add vanilla or  Hemorrhoids:       Sugar for taste  Anusol/Anusol H.C.  (RX: Analapram 2.5%)  Sugar Substitutes:  Hydrocortisone OTC     Ok in moderation  Preparation H      Tucks        Vaseline lotion applied to tissue with wiping    Herpes:     Throat:  Acyclovir      Oragel  Famvir  Valtrex     Vaccines:         Flu Shot Leg Cramps:       *Gardasil  Benadryl      Hepatitis A         Hepatitis B Nasal Spray:       Pneumovax  Saline Nasal Spray     Polio Booster         Tetanus Nausea:  Tuberculosis test or PPD  Vitamin B6 25 mg TID   AVOID:    Dramamine      *Gardasil  Emetrol       Live Poliovirus  Ginger Root 250 mg QID    MMR (measles, mumps &  High Complex Carbs @ Bedtime    rebella)  Sea Bands-Accupressure    Varicella (Chickenpox)  Unisom 1/2 tab TID     *No known complications           If received before Pain:         Known pregnancy;   Darvocet       Resume series after  Lortab        Delivery  Percocet    Yeast:   Tramadol      Femstat  Tylenol 3      Gyne-lotrimin  Ultram       Monistat  Vicodin           MISC:         All Sunscreens           Hair Coloring/highlights          Insect Repellant's          (Including DEET)         Mystic Tans

## 2019-06-28 NOTE — Progress Notes (Signed)
Kimberly Dunn presents for NOB nurse interview visit. Pregnancy confirmation done 05/31/2019. G4. [id number]. Pregnancy education material explained and given.0 cats in home. NOB labs ordered. TSH/HbgA1c ordered due to BMI 30 or greater.  HIV labs and drug screen were explained and ordered. PNV encouraged. Genetic screening options discussed. Genetic testing: Ordered/Declined/Unsure. Patient may discuss with the provider. Patient to follow up with provider in 1-2 weeks for NOB physical. All questions answered.

## 2019-06-29 LAB — GC/CHLAMYDIA PROBE AMP
Chlamydia trachomatis, NAA: NEGATIVE
Neisseria Gonorrhoeae by PCR: NEGATIVE

## 2019-06-29 LAB — URINALYSIS, ROUTINE W REFLEX MICROSCOPIC
Bilirubin, UA: NEGATIVE
Glucose, UA: NEGATIVE
Ketones, UA: NEGATIVE
Leukocytes,UA: NEGATIVE
Nitrite, UA: NEGATIVE
RBC, UA: NEGATIVE
Specific Gravity, UA: >=1.03 — AB (ref 1.005–1.030)
Urobilinogen, Ur: 0.2 mg/dL (ref 0.2–1.0)
pH, UA: 5.5 (ref 5.0–7.5)

## 2019-06-30 LAB — RPR, QUANT+TP ABS (REFLEX)
Rapid Plasma Reagin, Quant: 1:4 {titer} — ABNORMAL HIGH
T Pallidum Abs: REACTIVE — AB

## 2019-06-30 LAB — CULTURE, OB URINE

## 2019-06-30 LAB — ANTIBODY SCREEN: Antibody Screen: NEGATIVE

## 2019-06-30 LAB — VARICELLA ZOSTER ANTIBODY, IGG: Varicella zoster IgG: >4000 index (ref >165)

## 2019-06-30 LAB — RPR: RPR Ser Ql: REACTIVE — AB

## 2019-06-30 LAB — HIV ANTIBODY (ROUTINE TESTING W REFLEX): HIV Screen 4th Generation wRfx: NONREACTIVE

## 2019-06-30 LAB — ABO AND RH: Rh Factor: POSITIVE

## 2019-06-30 LAB — HEPATITIS B SURFACE ANTIGEN: Hepatitis B Surface Ag: NEGATIVE

## 2019-06-30 LAB — URINE CULTURE, OB REFLEX

## 2019-06-30 LAB — RUBELLA SCREEN: Rubella Antibodies, IGG: 2.73 index (ref >0.99)

## 2019-07-02 LAB — MONITOR DRUG PROFILE 14(MW)
Amphetamine Scrn, Ur: NEGATIVE ng/mL
BARBITURATE SCREEN URINE: NEGATIVE ng/mL
BENZODIAZEPINE SCREEN, URINE: NEGATIVE ng/mL
Buprenorphine, Urine: NEGATIVE ng/mL
CANNABINOIDS UR QL SCN: NEGATIVE ng/mL
Cocaine (Metab) Scrn, Ur: NEGATIVE ng/mL
Creatinine(Crt), U: 255.9 mg/dL (ref 20.0–300.0)
Fentanyl, Urine: NEGATIVE pg/mL
Meperidine Screen, Urine: NEGATIVE ng/mL
Methadone Screen, Urine: NEGATIVE ng/mL
OXYCODONE+OXYMORPHONE UR QL SCN: NEGATIVE ng/mL
Opiate Scrn, Ur: NEGATIVE ng/mL
Ph of Urine: 5.7 (ref 4.5–8.9)
Phencyclidine Qn, Ur: NEGATIVE ng/mL
Propoxyphene Scrn, Ur: NEGATIVE ng/mL
SPECIFIC GRAVITY: 1.026
Tramadol Screen, Urine: NEGATIVE ng/mL

## 2019-07-20 ENCOUNTER — Ambulatory Visit (INDEPENDENT_AMBULATORY_CARE_PROVIDER_SITE_OTHER): Payer: Medicaid Other | Admitting: Obstetrics and Gynecology

## 2019-07-20 ENCOUNTER — Encounter: Payer: Self-pay | Admitting: Obstetrics and Gynecology

## 2019-07-20 ENCOUNTER — Other Ambulatory Visit: Payer: Self-pay

## 2019-07-20 VITALS — BP 95/61 | HR 74 | Ht 68.0 in | Wt 203.5 lb

## 2019-07-20 DIAGNOSIS — Z3491 Encounter for supervision of normal pregnancy, unspecified, first trimester: Secondary | ICD-10-CM

## 2019-07-20 DIAGNOSIS — Z3A13 13 weeks gestation of pregnancy: Secondary | ICD-10-CM | POA: Diagnosis not present

## 2019-07-20 LAB — POCT URINALYSIS DIPSTICK OB
Bilirubin, UA: NEGATIVE
Blood, UA: NEGATIVE
Glucose, UA: NEGATIVE
Ketones, UA: NEGATIVE
Leukocytes, UA: NEGATIVE
Nitrite, UA: NEGATIVE
POC,PROTEIN,UA: NEGATIVE
Spec Grav, UA: 1.01 (ref 1.010–1.025)
Urobilinogen, UA: 0.2 E.U./dL
pH, UA: 6.5 (ref 5.0–8.0)

## 2019-07-20 NOTE — Progress Notes (Signed)
Patient comes in today for new OB physical.  

## 2019-07-20 NOTE — Progress Notes (Signed)
NOB:   Patient presents today for her new OB physical.  She reports that she has been having issues with nausea and vomiting.  She says that she is keeping food down and liquids down every day but also experiences periods of vomiting.  She believes her nausea and vomiting is related to her continued breast-feeding of her child.  She has been attempting to wean from breast-feeding but is finding this emotionally difficult.  I have offered her to speak with a lactation consultant and she has agreed.  She desires maternity 21 testing.  She states that she plans to use birth control immediately after birth of this next baby.  She is strongly considering IUD.  Physical examination General NAD, Conversant  HEENT Atraumatic; Op clear with mmm.  Normo-cephalic. Pupils reactive. Anicteric sclerae  Thyroid/Neck Smooth without nodularity or enlargement. Normal ROM.  Neck Supple.  Skin No rashes, lesions or ulceration. Normal palpated skin turgor. No nodularity.  Breasts: No masses or discharge.  Symmetric.  No axillary adenopathy.  Lungs: Clear to auscultation.No rales or wheezes. Normal Respiratory effort, no retractions.  Heart: NSR.  No murmurs or rubs appreciated. No periferal edema  Abdomen: Soft.  Non-tender.  No masses.  No HSM. No hernia  Extremities: Moves all appropriately.  Normal ROM for age. No lymphadenopathy.  Neuro: Oriented to PPT.  Normal mood. Normal affect.     Pelvic:   Vulva: Normal appearance.  No lesions.  Vagina: No lesions or abnormalities noted.  Support: Normal pelvic support.  Urethra No masses tenderness or scarring.  Meatus Normal size without lesions or prolapse.  Cervix: Normal appearance.  No lesions.  Anus: Normal exam.  No lesions.  Perineum: Normal exam.  No lesions.        Bimanual   Adnexae: No masses.  Non-tender to palpation.  Uterus: Enlarged. 13 wks POS FHTs  Non-tender.  Mobile.  AV.  Adnexae: No masses.  Non-tender to palpation.  Cul-de-sac: Negative for  abnormality.  Adnexae: No masses.  Non-tender to palpation.         Pelvimetry   Diagonal: Reached.  Spines: Average.  Sacrum: Concave.  Pubic Arch: Normal.    P: Prenatal Plan 1.  The patient was given prenatal literature. 2.  She was continued on prenatal vitamins. 3.  A prenatal lab panel was ordered or drawn. 4.  We discussed nausea and vomiting in detail and general strategies reviewed.  Patient continues to take Diclegis.Marland Kitchen 5.  A nurse visit was previously performed. 6.  Genetic testing and testing for other inheritable conditions discussed in detail.  Desires maternity 21.. 7.  A general overview of pregnancy testing, visit schedule, ultrasound schedule, and prenatal care was discussed. 8.  Benefits of breast-feeding discussed in detail including both maternal and infant benefits. Ready Set Baby website discussed.The following were addressed during this visit:  Breastfeeding Education - Early initiation of breastfeeding    Comments: Keeps milk supply adequate, helps contract uterus and slow bleeding, and early milk is the perfect first food and is easy to digest.   - The importance of exclusive breastfeeding    Comments: Provides antibodies, Lower risk of breast and ovarian cancers, and type-2 diabetes,Helps your body recover, Reduced chance of SIDS.   - The importance of early skin-to-skin contact    Comments: Keeps baby warm and secure, helps keep baby's blood sugar up and breathing steady, easier to bond and breastfeed, and helps calm baby.  - Exclusive breastfeeding for the first 6 months  Comments: Builds a healthy milk supply and keeps it up, protects baby from sickness and disease, and breastmilk has everything your baby needs for the first 6 months.  - Individualized Education    Comments: Contraindications to breastfeeding and other special medical conditions

## 2019-07-21 LAB — CBC WITH DIFFERENTIAL/PLATELET
Basophils Absolute: 0 10*3/uL (ref 0.0–0.2)
Basos: 0 %
EOS (ABSOLUTE): 0.1 10*3/uL (ref 0.0–0.4)
Eos: 1 %
Hematocrit: 31.7 % — ABNORMAL LOW (ref 34.0–46.6)
Hemoglobin: 10.9 g/dL — ABNORMAL LOW (ref 11.1–15.9)
Immature Grans (Abs): 0 10*3/uL (ref 0.0–0.1)
Immature Granulocytes: 0 %
Lymphocytes Absolute: 1.7 10*3/uL (ref 0.7–3.1)
Lymphs: 24 %
MCH: 30.2 pg (ref 26.6–33.0)
MCHC: 34.4 g/dL (ref 31.5–35.7)
MCV: 88 fL (ref 79–97)
Monocytes Absolute: 0.4 10*3/uL (ref 0.1–0.9)
Monocytes: 5 %
Neutrophils Absolute: 4.9 10*3/uL (ref 1.4–7.0)
Neutrophils: 70 %
Platelets: 257 10*3/uL (ref 150–450)
RBC: 3.61 x10E6/uL — ABNORMAL LOW (ref 3.77–5.28)
RDW: 12.8 % (ref 11.7–15.4)
WBC: 7 10*3/uL (ref 3.4–10.8)

## 2019-07-25 LAB — MATERNIT21  PLUS CORE+ESS+SCA, BLOOD

## 2019-08-17 ENCOUNTER — Other Ambulatory Visit: Payer: Self-pay

## 2019-08-17 ENCOUNTER — Ambulatory Visit (INDEPENDENT_AMBULATORY_CARE_PROVIDER_SITE_OTHER): Payer: Medicaid Other | Admitting: Obstetrics and Gynecology

## 2019-08-17 ENCOUNTER — Encounter: Payer: Self-pay | Admitting: Obstetrics and Gynecology

## 2019-08-17 VITALS — BP 100/63 | HR 81 | Wt 207.4 lb

## 2019-08-17 DIAGNOSIS — Z3A17 17 weeks gestation of pregnancy: Secondary | ICD-10-CM

## 2019-08-17 DIAGNOSIS — Z23 Encounter for immunization: Secondary | ICD-10-CM | POA: Diagnosis not present

## 2019-08-17 DIAGNOSIS — Z3482 Encounter for supervision of other normal pregnancy, second trimester: Secondary | ICD-10-CM

## 2019-08-17 DIAGNOSIS — Z8619 Personal history of other infectious and parasitic diseases: Secondary | ICD-10-CM

## 2019-08-17 DIAGNOSIS — M549 Dorsalgia, unspecified: Secondary | ICD-10-CM

## 2019-08-17 DIAGNOSIS — O99892 Other specified diseases and conditions complicating childbirth: Secondary | ICD-10-CM

## 2019-08-17 LAB — POCT URINALYSIS DIPSTICK OB
Bilirubin, UA: NEGATIVE
Blood, UA: NEGATIVE
Glucose, UA: NEGATIVE
Ketones, UA: NEGATIVE
Leukocytes, UA: NEGATIVE
Nitrite, UA: NEGATIVE
POC,PROTEIN,UA: NEGATIVE
Spec Grav, UA: 1.01 (ref 1.010–1.025)
Urobilinogen, UA: 0.2 E.U./dL
pH, UA: 7 (ref 5.0–8.0)

## 2019-08-17 NOTE — Patient Instructions (Addendum)
Influenza (Flu) Vaccine (Inactivated or Recombinant): What You Need to Know 1. Why get vaccinated? Influenza vaccine can prevent influenza (flu). Flu is a contagious disease that spreads around the [place] every year, usually between October and May. Anyone can get the flu, but it is more dangerous for some people. Infants and young children, people 29 years of age and older, pregnant women, and people with certain health conditions or a weakened immune system are at greatest risk of flu complications. Pneumonia, bronchitis, sinus infections and ear infections are examples of flu-related complications. If you have a medical condition, such as heart disease, cancer or diabetes, flu can make it worse. Flu can cause fever and chills, sore throat, muscle aches, fatigue, cough, headache, and runny or stuffy nose. Some people may have vomiting and diarrhea, though this is more common in children than adults. Each year thousands of people in the [place] die from flu, and many more are hospitalized. Flu vaccine prevents millions of illnesses and flu-related visits to the doctor each year. 2. Influenza vaccine CDC recommends everyone 57 months of age and older get vaccinated every flu season. Children 6 months through 2 years of age may need 2 doses during a single flu season. Everyone else needs only 1 dose each flu season. It takes about 2 weeks for protection to develop after vaccination. There are many flu viruses, and they are always changing. Each year a new flu vaccine is made to protect against three or four viruses that are likely to cause disease in the upcoming flu season. Even when the vaccine doesn't exactly match these viruses, it may still provide some protection. Influenza vaccine does not cause flu. Influenza vaccine may be given at the same time as other vaccines. 3. Talk with your health care provider Tell your vaccine provider if the person getting the vaccine:  Has had an  allergic reaction after a previous dose of influenza vaccine, or has any severe, life-threatening allergies.  Has ever had Guillain-Barr Syndrome (also called GBS). In some cases, your health care provider may decide to postpone influenza vaccination to a future visit. People with minor illnesses, such as a cold, may be vaccinated. People who are moderately or severely ill should usually wait until they recover before getting influenza vaccine. Your health care provider can give you more information. 4. Risks of a vaccine reaction  Soreness, redness, and swelling where shot is given, fever, muscle aches, and headache can happen after influenza vaccine.  There may be a very small increased risk of Guillain-Barr Syndrome (GBS) after inactivated influenza vaccine (the flu shot). Young children who get the flu shot along with pneumococcal vaccine (PCV13), and/or DTaP vaccine at the same time might be slightly more likely to have a seizure caused by fever. Tell your health care provider if a child who is getting flu vaccine has ever had a seizure. People sometimes faint after medical procedures, including vaccination. Tell your provider if you feel dizzy or have vision changes or ringing in the ears. As with any medicine, there is a very remote chance of a vaccine causing a severe allergic reaction, other serious injury, or death. 5. What if there is a serious problem? An allergic reaction could occur after the vaccinated person leaves the clinic. If you see signs of a severe allergic reaction (hives, swelling of the face and throat, difficulty breathing, a fast heartbeat, dizziness, or weakness), call 9-1-1 and get the person to the nearest hospital. For other signs that  concern you, call your health care provider. Adverse reactions should be reported to the Vaccine Adverse Event Reporting System (VAERS). Your health care provider will usually file this report, or you can do it yourself. Visit the  VAERS website at www.vaers.SamedayNews.es or call 614-547-1315.VAERS is only for reporting reactions, and VAERS staff do not give medical advice. 6. The National Vaccine Injury Compensation Program The [employer] Vaccine Injury Compensation Program (VICP) is a federal program that was created to compensate people who may have been injured by certain vaccines. Visit the VICP website at GoldCloset.com.ee or call 509-513-9641 to learn about the program and about filing a claim. There is a time limit to file a claim for compensation. 7. How can I learn more?  Ask your healthcare provider.  Call your local or state health department.  Contact the Centers for Disease Control and Prevention (CDC): ? Call (920)194-7941 (1-800-CDC-INFO) or ? Visit CDC's https://gibson.com/ Vaccine Information Statement (Interim) Inactivated Influenza Vaccine (04/23/2018) This information is not intended to replace advice given to you by your health care provider. Make sure you discuss any questions you have with your health care provider. Document Released: 06/20/2006 Document Revised: 12/15/2018 Document Reviewed: 04/27/2018 Elsevier Patient Education  [place] of Pregnancy  The second trimester is from week 14 through week 27 (month 4 through 6). This is often the time in pregnancy that you feel your best. Often times, morning sickness has lessened or quit. You may have more energy, and you may get hungry more often. Your unborn baby is growing rapidly. At the end of the sixth month, he or she is about 9 inches long and weighs about 1 pounds. You will likely feel the baby move between 18 and 20 weeks of pregnancy. Follow these instructions at home: Medicines  Take over-the-counter and prescription medicines only as told by your doctor. Some medicines are safe and some medicines are not safe during pregnancy.  Take a prenatal vitamin that contains at least 600 micrograms (mcg) of  folic acid.  If you have trouble pooping (constipation), take medicine that will make your stool soft (stool softener) if your doctor approves. Eating and drinking   Eat regular, healthy meals.  Avoid raw meat and uncooked cheese.  If you get low calcium from the food you eat, talk to your doctor about taking a daily calcium supplement.  Avoid foods that are high in fat and sugars, such as fried and sweet foods.  If you feel sick to your stomach (nauseous) or throw up (vomit): ? Eat 4 or 5 small meals a day instead of 3 large meals. ? Try eating a few soda crackers. ? Drink liquids between meals instead of during meals.  To prevent constipation: ? Eat foods that are high in fiber, like fresh fruits and vegetables, whole grains, and beans. ? Drink enough fluids to keep your pee (urine) clear or pale yellow. Activity  Exercise only as told by your doctor. Stop exercising if you start to have cramps.  Do not exercise if it is too hot, too humid, or if you are in a place of great height (high altitude).  Avoid heavy lifting.  Wear low-heeled shoes. Sit and stand up straight.  You can continue to have sex unless your doctor tells you not to. Relieving pain and discomfort  Wear a good support bra if your breasts are tender.  Take warm water baths (sitz baths) to soothe pain or discomfort caused by hemorrhoids. Use hemorrhoid cream if  your doctor approves.  Rest with your legs raised if you have leg cramps or low back pain.  If you develop puffy, bulging veins (varicose veins) in your legs: ? Wear support hose or compression stockings as told by your doctor. ? Raise (elevate) your feet for 15 minutes, 3-4 times a day. ? Limit salt in your food. Prenatal care  Write down your questions. Take them to your prenatal visits.  Keep all your prenatal visits as told by your doctor. This is important. Safety  Wear your seat belt when driving.  Make a list of emergency phone  numbers, including numbers for family, friends, the hospital, and police and fire departments. General instructions  Ask your doctor about the right foods to eat or for help finding a counselor, if you need these services.  Ask your doctor about local prenatal classes. Begin classes before month 6 of your pregnancy.  Do not use hot tubs, steam rooms, or saunas.  Do not douche or use tampons or scented sanitary pads.  Do not cross your legs for long periods of time.  Visit your dentist if you have not done so. Use a soft toothbrush to brush your teeth. Floss gently.  Avoid all smoking, herbs, and alcohol. Avoid drugs that are not approved by your doctor.  Do not use any products that contain nicotine or tobacco, such as cigarettes and e-cigarettes. If you need help quitting, ask your doctor.  Avoid cat litter boxes and soil used by cats. These carry germs that can cause birth defects in the baby and can cause a loss of your baby (miscarriage) or stillbirth. Contact a doctor if:  You have mild cramps or pressure in your lower belly.  You have pain when you pee (urinate).  You have bad smelling fluid coming from your vagina.  You continue to feel sick to your stomach (nauseous), throw up (vomit), or have watery poop (diarrhea).  You have a nagging pain in your belly area.  You feel dizzy. Get help right away if:  You have a fever.  You are leaking fluid from your vagina.  You have spotting or bleeding from your vagina.  You have severe belly cramping or pain.  You lose or gain weight rapidly.  You have trouble catching your breath and have chest pain.  You notice sudden or extreme puffiness (swelling) of your face, hands, ankles, feet, or legs.  You have not felt the baby move in over an hour.  You have severe headaches that do not go away when you take medicine.  You have trouble seeing. Summary  The second trimester is from week 14 through week 27 (months 4  through 6). This is often the time in pregnancy that you feel your best.  To take care of yourself and your unborn baby, you will need to eat healthy meals, take medicines only if your doctor tells you to do so, and do activities that are safe for you and your baby.  Call your doctor if you get sick or if you notice anything unusual about your pregnancy. Also, call your doctor if you need help with the right food to eat, or if you want to know what activities are safe for you. This information is not intended to replace advice given to you by your health care provider. Make sure you discuss any questions you have with your health care provider. Document Released: 11/20/2009 Document Revised: 12/18/2018 Document Reviewed: 10/01/2016 Elsevier Patient Education  2020 [employer].  Morning Sickness  Morning sickness is when you feel sick to your stomach (nauseous) during pregnancy. You may feel sick to your stomach and throw up (vomit). You may feel sick in the morning, but you can feel this way at any time of day. Some women feel very sick to their stomach and cannot stop throwing up (hyperemesis gravidarum). Follow these instructions at home: Medicines  Take over-the-counter and prescription medicines only as told by your doctor. Do not take any medicines until you talk with your doctor about them first.  Taking multivitamins before getting pregnant can stop or lessen the harshness of morning sickness. Eating and drinking  Eat dry toast or crackers before getting out of bed.  Eat 5 or 6 small meals a day.  Eat dry and bland foods like rice and baked potatoes.  Do not eat greasy, fatty, or spicy foods.  Have someone cook for you if the smell of food causes you to feel sick or throw up.  If you feel sick to your stomach after taking prenatal vitamins, take them at night or with a snack.  Eat protein when you need a snack. Nuts, yogurt, and cheese are good choices.  Drink fluids  throughout the day.  Try ginger ale made with real ginger, ginger tea made from fresh grated ginger, or ginger candies. General instructions  Do not use any products that have nicotine or tobacco in them, such as cigarettes and e-cigarettes. If you need help quitting, ask your doctor.  Use an air purifier to keep the air in your house free of smells.  Get lots of fresh air.  Try to avoid smells that make you feel sick.  Try: ? Wearing a bracelet that is used for seasickness (acupressure wristband). ? Going to a doctor who puts thin needles into certain body points (acupuncture) to improve how you feel. Contact a doctor if:  You need medicine to feel better.  You feel dizzy or light-headed.  You are losing weight. Get help right away if:  You feel very sick to your stomach and cannot stop throwing up.  You pass out (faint).  You have very bad pain in your belly. Summary  Morning sickness is when you feel sick to your stomach (nauseous) during pregnancy.  You may feel sick in the morning, but you can feel this way at any time of day.  Making some changes to what you eat may help your symptoms go away. This information is not intended to replace advice given to you by your health care provider. Make sure you discuss any questions you have with your health care provider. Document Released: 10/03/2004 Document Revised: 08/08/2017 Document Reviewed: 09/26/2016 Elsevier Patient Education  2020 [employer].

## 2019-08-17 NOTE — Progress Notes (Signed)
ROB: Patient complains of back pain and vaginal pressure, mostly at night. Has tried tylenol and icy-hot and warm showers. Encouraged use of pregnancy girdle.  Also can refer to physical therapy. Still also has some mild nausea but is dealing with it.  Lastly noting headaches 3-4 times daily across temple. Denies any current stressors. Has been craving salty foods. Discussed magnesium supplementation for headaches. Flu vaccine given today.  RTC in 4 weeks, for anatomy scan at that time.

## 2019-08-17 NOTE — Progress Notes (Signed)
[name] present for routine prenatal care. Pt stated having lower back pain and vaginal pressure. Flu vaccine administered and documented today.

## 2019-09-08 ENCOUNTER — Other Ambulatory Visit: Payer: Self-pay

## 2019-09-08 ENCOUNTER — Ambulatory Visit: Payer: Medicaid Other | Attending: Obstetrics and Gynecology

## 2019-09-08 DIAGNOSIS — M533 Sacrococcygeal disorders, not elsewhere classified: Secondary | ICD-10-CM | POA: Diagnosis not present

## 2019-09-08 DIAGNOSIS — M62838 Other muscle spasm: Secondary | ICD-10-CM

## 2019-09-08 DIAGNOSIS — R293 Abnormal posture: Secondary | ICD-10-CM | POA: Diagnosis not present

## 2019-09-08 NOTE — Therapy (Signed)
[place] [facility] SERVICES [address] [place], [place], [address] Phone: [phone]   Fax:  [phone]  Physical Therapy Evaluation  The patient has been informed of current processes in place at Outpatient Rehab to protect patients from Covid-19 exposure including social distancing, schedule modifications, and new cleaning procedures. After discussing their particular risk with a therapist based on the patient's personal risk factors, the patient has decided to proceed with in-person therapy.   Patient Details  Name: Kimberly Dunn MRN: [id number] Date of Birth: 1991-05-15 No data recorded  Encounter Date: 09/08/2019  PT End of Session - 09/08/19 1318    Visit Number  1    Number of Visits  10    Date for PT Re-Evaluation  11/17/19    Authorization Type  mcaid    Authorization Time Period  09/08/2019 through 10/06/2019    Authorization - Visit Number  1    Authorization - Number of Visits  4    PT Start Time  1130    PT Stop Time  1230    PT Time Calculation (min)  60 min    Activity Tolerance  Patient tolerated treatment well;No increased pain    Behavior During Therapy  WFL for tasks assessed/performed       Past Medical History:  Diagnosis Date  . Abnormal Pap smear   . Anemia   . HPV (human papilloma virus) infection     Past Surgical History:  Procedure Laterality Date  . CHOLECYSTECTOMY    . CONDYLOMA EXCISION/FULGURATION    . TONSILLECTOMY      There were no vitals filed for this visit.       [facility] PT Assessment - 09/08/19 0001      Balance Screen   Has the patient fallen in the past 6 months  No        Pelvic Floor Physical Therapy Evaluation and Assessment  SCREENING  Falls in last 6 mo: no   Red Flags:  Have you had any night sweats? no Unexplained weight loss? no Saddle anesthesia? no Unexplained changes in bowel or bladder habits? no  SUBJECTIVE  Patient reports: Has 2 daughters,  had pelvic pain with her second daughter that "felt like she was going to split in half" when she moved in bed. She sometimes had "her leg giving out" then too but it got better when she gave birth and was able to workout again. Loves to lay on her left side. Gets pain in her R leg when she layson it. Has pain when she goes to sit down on the toilet. Has increased pain when she is not mobile or if she "over-does it" feels best when she is gently moving for a little while. Has noticed that her RLE wants to internally rotate. Started having pain during this pregnancy at ~ 13-14 weeks. Catches herself holding onto her back even with just walking for a little time in the grocery store. Has tried "stretches" and cat-cow, and bird-dog, bridges, etc. And it has not helped. Feels best laying on her L side with R leg rotated forward and propped up on a pillow or something.  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 One miscarriage.  Gynecological History: None   Urinary History: Has leakage if she jumps on the trampoline or vomiting  Drinks 4-5 16 oz. Bottles of water per day, sometimes watered down juice  Gastrointestinal History: Has a BM every 3-4 days and is having to strain. Type 1-2 BM's   Sexual activity/pain: Occasional pain with deep thrusting in lower abdomen which is a little achy after.  Location of pain: R LB and RLE Current pain:  4/10  Max pain:  9/10 Least pain:  3/10 Nature of pain: sharp in the morning, unstable, achy throughout the day.  Patient Goals: To be able to walk for exercise for 30 min. Without pain.     OBJECTIVE  Posture/Observations:  Sitting:  Standing: anterior weight shift, anterior  pelvic tilt, L shoulder high, overpronation in R>L, FHP, L PSIS high,   Supine: L ASIS high, L ankle low  Palpation/Segmental Motion/Joint Play: TTP to R QL, L Piriformis and , decreased mobility at R SIJ with TTP.  Special tests:   Stork: decreased mobility on R Supine to long-sit: LLE long in lying, less-long in sitting Leg-length: appear even but pelvic obliquity present  Range of Motion/Flexibilty:  Spine: L SB at knee (tight on R), R 2 fingers past knee. ROT ROM WNL but feels tight in L mid-back with R ROT.  Hips: HS tight with forward bend, 4 in. From floor at finger tips.   Strength/MMT: Deferred to follow up LE MMT  LE MMT Left Right  Hip flex:  (L2) /5 /5  Hip ext: /5 /5  Hip abd: /5 /5  Hip add: /5 /5  Hip IR /5 /5  Hip ER /5 /5     Abdominal: deferred to follow up Palpation: Diastasis:  Pelvic Floor External Exam: Deferred until deemed appropriate Introitus Appears:  Skin integrity:  Palpation: Cough: Prolapse visible?: Scar mobility:  Internal Vaginal Exam: Strength (PERF):  Symmetry: Palpation: Prolapse:  Gait Analysis: Slow cadence, wide BOS, antalgic.  Pelvic Floor Outcome Measures: PGQ:38/75 (51%)  INTERVENTIONS THIS SESSION: Self-care: Educated on the structure and function of the pelvic floor in relation to their symptoms as well as the POC, and initial HEP in order to set patient expectations and understanding from which we will build on in the future sessions. Educated on ways to help decrease nausea safely, how to move in bed and do supine-to sit with decreased pain.   Total time: 60 min.           Objective measurements completed on examination: See above findings.                PT Short Term Goals - 09/08/19 1428      PT SHORT TERM GOAL #1   Title  Patient will demonstrate improved pelvic alignment and balance of musculature surrounding the pelvis to facilitate decreased PFM spasms and decrease pelvic and LB  pain.    Baseline  R up-sip and anterior rotation Vs. LLE long    Time  3    Period  Weeks    Status  New    Target Date  09/29/19      PT SHORT TERM GOAL #2   Title  Patient will report a reduction in pain to no greater than 5/10 over the prior week to demonstrate symptom improvement.    Baseline  Max pain of 9/10    Time  3    Period  Weeks    Status  New    Target Date  09/29/19      PT SHORT TERM GOAL #3   Title  Patient will demonstrate HEP x1 in the clinic to demonstrate understanding and proper form to allow for further improvement.    Baseline  Pt. lacks knowledge of therapeutic exercises that will decrease her pain and Sx.    Time  3    Period  Weeks    Status  New    Target Date  09/29/19      PT SHORT TERM GOAL #4   Title  Patient will report consistent use of foot-stool (squatty-potty) for positioning with BM to decrease pain with BM and intra-abdominal pressure.    Baseline  Pt. having a BM every 3-4 days and having to strain. Bristol stool scale 1-2    Time  3    Period  Weeks    Status  New    Target Date  09/29/19        PT Long Term Goals - 09/08/19 1442      PT LONG TERM GOAL #1   Title  Patient will describe pain no greater than 2/10 during walking and exercising at light to moderate intensity for 20-30 min to demonstrate improved functional ability and allow for improved overall health.    Baseline  has increased pain with cat/cow, bridging, walking, sitting, standing, bending, etc. up to 9/10 in the morning and at night.    Time  10    Period  Weeks    Status  New    Target Date  11/17/19      PT LONG TERM GOAL #2   Title  Patient will score at or below 25% on the PGQ to demonstrate a clinically significant decrease in disability and improved functional ability.    Baseline  PGQ: 38/75 (51%)    Time  10    Period  Weeks    Status  New    Target Date  11/17/19      PT LONG TERM GOAL #3   Title  Patient will report no episodes of SUI over the  course of the prior two weeks to demonstrate improved functional ability.    Baseline  Pt. having SUI with vomiting and jumping    Time  10    Period  Weeks    Status  New    Target Date  11/17/19      PT LONG TERM GOAL #4   Title  Patient will report having BM's at least every-other day with consistency between [facility] stool scale 3-5 over the prior week to demonstrate decreased constipation.    Baseline  Pt. having BM's every 3-4 days with bristol type 1-2 stool and straining    Time  10    Period  Weeks    Status  New    Target Date  11/17/19      PT LONG TERM GOAL #5   Title  Patient will report no pain with intercourse to demonstrate improved functional ability and allow for natural encouragement of labor when appropriate.    Baseline  Pt. having pain with deeper thrusting and ache following for "a little while"    Time  10    Period  Weeks    Status  New    Target Date  11/17/19             Plan - 09/08/19 1321    Clinical Impression Statement  Pt. is a 28 y/o female who presents today with cheif c/o  LBP that radiates into her RLE as well as mild constipation, SUI, and occasional dyspareunia. Her PMH is significant for 2 vaginal deliveries, one with mild tearing, as well as h/o pelvic and RLE during prior pregnancy. Her clinical exam and history revealed pelvic obliquity with a R up-slip and anterior rotation as well as spasms in R QL and L Piriformis and OI. She will benefit from skilled pelvic health PT to address the noted deficits and to continue to assess for and address any other potential causes of Sx.    Examination-Activity Limitations  Bed Mobility;Squat;Lift;Bend;Stairs;Geophysical data processor for [employer];Reach Overhead;Continence;Toileting;Dressing;Sit    Examination-Participation Restrictions  Cleaning;Laundry;Shop;Community Activity;Interpersonal Relationship;Yard Work    Pharmacologist  Low    Rehab Potential  Good    PT Frequency  1x / week    PT Duration  Other (comment)   10 weeks   PT Treatment/Interventions  ADLs/Self Care Home Management;Electrical Stimulation;Moist Heat;Ultrasound;Gait training;Functional mobility training;Therapeutic activities;Neuromuscular re-education;Balance training;Therapeutic exercise;Patient/family education;Manual techniques;Dry needling;Taping;Spinal Manipulations;Joint Manipulations    PT Next Visit Plan  re-assess alignment, decrease spasms and align pelvis, re-check alignment and LLD.    Consulted and Agree with Plan of Care  Patient       Patient will benefit from skilled therapeutic intervention in order to improve the following deficits and impairments:  Abnormal gait, Decreased balance, Decreased endurance, Decreased mobility, Difficulty walking, Increased muscle spasms, Improper body mechanics, Decreased activity tolerance, Decreased strength, Hypermobility, Postural dysfunction, Pain  Visit Diagnosis: Abnormal posture  Other muscle spasm  Sacrococcygeal disorders, not elsewhere classified     Problem List Patient Active Problem List   Diagnosis Date Noted  . Normal labor 02/02/2018  . Anemia of pregnancy in third trimester 12/22/2017  . Heartburn in pregnancy in third trimester 12/22/2017  . Nausea and vomiting in pregnancy 11/05/2017  . History of postpartum hemorrhage 09/17/2017  . 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 Fall from standing 11/23/2011  . Edema or excessive weight gain, antepartum 10/14/2011  . Anemia 10/09/2011  . Abnormal Pap smear and cervical HPV (human papillomavirus) 10/09/2011   Cleophus Molt DPT, ATC Cleophus Molt 09/08/2019, 3:01 PM  [facility] MAIN [facility] SERVICES [address] [place], [place], [address] Phone: [phone]   Fax:  [phone]  Name: Kimberly Dunn MRN: [id number] Date of Birth: November 20, 1990

## 2019-09-08 NOTE — Patient Instructions (Signed)
    Look into "preggie pops" and Unisom and B12 for nausea.

## 2019-09-10 NOTE — L&D Delivery Note (Addendum)
Delivery Summary for Kimberly Dunn  Labor Events:   Preterm labor: No data found  Rupture date: No data found  Rupture time: No data found  Rupture type: Intact Bulging bag of water  Fluid Color: No data found  Induction: No data found  Augmentation: No data found  Complications: No data found  Cervical ripening: No data found No data found   No data found     Delivery:   Episiotomy: No data found  Lacerations: No data found  Repair suture: No data found  Repair # of packets: No data found  Blood loss (ml): 175    Information for the patient's newborn:  Lindee, Leason [[id number]]    Delivery 01/25/2020 7:17 PM by  Vaginal, Spontaneous Sex:  female Gestational Age: [redacted]w[redacted]d Delivery Clinician:   Living?:         APGARS  One minute Five minutes Ten minutes  Skin color:        Heart rate:        Grimace:        Muscle tone:        Breathing:        Totals: 9  9      Presentation/position:      Resuscitation:   Cord information:    Disposition of cord blood:     Blood gases sent?  Complications:   Placenta: Delivered:       appearance Newborn Measurements: Weight: 9 lb 1 oz (4110 g)  Height: 22.05"  Head circumference:    Chest circumference:    Other providers:    Additional  information: Forceps:   Vacuum:   Breech:   Observed anomalies        Delivery Note At 7:17 PM a viable and healthy child was delivered via Vaginal, Spontaneous (Presentation: Vertex; LOA position).  APGAR: 9, 9 ; weight 4110 grams.   Placenta status: Spontaneous, Intact.  Cord: 3 vessels with the following complications: None.  Cord pH: not obtained.  Delayed cord clamping observed.  Anesthesia: None Episiotomy: None Lacerations: None Suture Repair: None Est. Blood Loss (mL):  175  Mom to postpartum.  Baby to Couplet care / Skin to Skin.  Hildred Laser, MD 01/25/2020, 7:36 PM

## 2019-09-14 ENCOUNTER — Other Ambulatory Visit: Payer: Self-pay

## 2019-09-14 ENCOUNTER — Ambulatory Visit (INDEPENDENT_AMBULATORY_CARE_PROVIDER_SITE_OTHER): Payer: Medicaid Other | Admitting: Obstetrics and Gynecology

## 2019-09-14 ENCOUNTER — Ambulatory Visit (INDEPENDENT_AMBULATORY_CARE_PROVIDER_SITE_OTHER): Payer: Medicaid Other

## 2019-09-14 ENCOUNTER — Encounter: Payer: Self-pay | Admitting: Obstetrics and Gynecology

## 2019-09-14 VITALS — BP 91/62 | HR 83 | Wt 209.3 lb

## 2019-09-14 DIAGNOSIS — Z363 Encounter for antenatal screening for malformations: Secondary | ICD-10-CM

## 2019-09-14 DIAGNOSIS — Z3482 Encounter for supervision of other normal pregnancy, second trimester: Secondary | ICD-10-CM

## 2019-09-14 DIAGNOSIS — Z3A21 21 weeks gestation of pregnancy: Secondary | ICD-10-CM

## 2019-09-14 LAB — POCT URINALYSIS DIPSTICK OB
Bilirubin, UA: NEGATIVE
Blood, UA: NEGATIVE
Glucose, UA: NEGATIVE
Ketones, UA: NEGATIVE
Leukocytes, UA: NEGATIVE
Nitrite, UA: NEGATIVE
POC,PROTEIN,UA: NEGATIVE
Spec Grav, UA: 1.01 (ref 1.010–1.025)
Urobilinogen, UA: 0.2 E.U./dL
pH, UA: 7 (ref 5.0–8.0)

## 2019-09-14 NOTE — Progress Notes (Signed)
ROB: No complaints.  Says that she is feeling much better.  Reports active baby.  FAS ultrasound today.

## 2019-09-16 ENCOUNTER — Ambulatory Visit: Payer: Medicaid Other | Attending: Obstetrics and Gynecology

## 2019-09-16 DIAGNOSIS — M62838 Other muscle spasm: Secondary | ICD-10-CM | POA: Insufficient documentation

## 2019-09-16 DIAGNOSIS — M533 Sacrococcygeal disorders, not elsewhere classified: Secondary | ICD-10-CM | POA: Insufficient documentation

## 2019-09-16 DIAGNOSIS — R293 Abnormal posture: Secondary | ICD-10-CM | POA: Insufficient documentation

## 2019-09-21 ENCOUNTER — Ambulatory Visit: Payer: Medicaid Other

## 2019-09-21 ENCOUNTER — Other Ambulatory Visit: Payer: Self-pay

## 2019-09-21 DIAGNOSIS — R293 Abnormal posture: Secondary | ICD-10-CM

## 2019-09-21 DIAGNOSIS — M62838 Other muscle spasm: Secondary | ICD-10-CM | POA: Diagnosis not present

## 2019-09-21 DIAGNOSIS — M533 Sacrococcygeal disorders, not elsewhere classified: Secondary | ICD-10-CM | POA: Diagnosis not present

## 2019-09-21 NOTE — Patient Instructions (Signed)
A Few Pillows: Good sleep is crucial, try these positioning tricks to decrease strain on your changing body.  . Make sure that your head pillow is thick enough to support your head in line with your spine. . Use a thin or wedge-shaped pillow under the belly to decrease the strain of extra weight acting on your spine. . Place a pillow between your knees to take pressure off your hips and low back. Marland Kitchen Keep your knees slightly bent to keep your back in neutral alignment.     Do 2 sets of 15 tilts per day. Breathe in when you tilt forward (A) and out when you tuck under (B).    Sit, feet flat, scoot forward to the edge of the chair. Inhale as you bend forward at hips, begin to exhale just before and while you stand, contracting the glutes, lower tummy muscles and pelvic floor as if stopping urination as you stand up.   * Do this every time you sit or stand! If you catch yourself doing it "wrong, re-set and do it again so it can become habit!

## 2019-09-21 NOTE — Therapy (Signed)
[place] MAIN [facility] SERVICES [address] [place], [place], [address] Phone: [phone]   Fax:  [phone]  Physical Therapy Treatment  The patient has been informed of current processes in place at Outpatient Rehab to protect patients from Covid-19 exposure including social distancing, schedule modifications, and new cleaning procedures. After discussing their particular risk with a therapist based on the patient's personal risk factors, the patient has decided to proceed with in-person therapy.   Patient Details  Name: Kimberly Dunn MRN: [id number] Date of Birth: [birth year] No data recorded  Encounter Date: 09/21/2019  PT End of Session - 09/21/19 1043    Visit Number  2    Number of Visits  10    Date for PT Re-Evaluation  11/17/19    Authorization Type  mcaid    Authorization Time Period  09/08/2019 through 10/06/2019    Authorization - Visit Number  2    Authorization - Number of Visits  4    PT Start Time  1027    PT Stop Time  1140    PT Time Calculation (min)  60 min    Activity Tolerance  Patient tolerated treatment well;No increased pain    Behavior During Therapy  WFL for tasks assessed/performed       Past Medical History:  Diagnosis Date  . Abnormal Pap smear   . Anemia   . HPV (human papilloma virus) infection     Past Surgical History:  Procedure Laterality Date  . CHOLECYSTECTOMY    . CONDYLOMA EXCISION/FULGURATION    . TONSILLECTOMY      There were no vitals filed for this visit.   Pelvic Floor Physical Therapy Treatment Note  SCREENING  Changes in medications, allergies, or medical history?: none    SUBJECTIVE  Patient reports: Has pain that is worse at night.   Precautions:  [redacted] weeks pregnant  Pain update:  Location of pain: RLE to knee  Current pain:  6/10  Max pain:  8/10 Least pain:  6/10 Nature of pain: dull achy  Patient Goals: To be able to walk for exercise for 30 min.  Without pain.     OBJECTIVE  Changes in: Posture/Observations:  R up-slip and anterior rotation. Leg-length normal Pt. Walks in with antalgic gait, slow cautious steps, avoiding loading the RLE as much as possible.  -Following session pt. Describes resolution of pain but continues to walk cautiously, slowly even with VC to increase stride length and be careful not to avoid loading it to prevent return of imbalance.  Range of Motion/Flexibilty:  Decreased sacral mobility along the L sacral border.   Strength/MMT:  LE MMT:  Pelvic floor:  Abdominal:   Palpation: TTP to R QL, Psoas, Glute Min. And Piriformis, and B multifidus near L5.  Gait Analysis:  INTERVENTIONS THIS SESSION: Manual: performed TP release to R QL, Psoas, Glute Min. And Piriformis, and B multifidus near L5 to decrease spasm and pain and allow for improved balance of musculature for improved function and decreased symptoms. This was followed by grade 3-4 PA mobs along the L sacral border, a R up-slip correction, and MET correction x2 for R anterior innominate rotation to improve pelvic alignment and decrease pain and spasm.  There: Educated on and practiced seated pelvic tilts, sit-to-stands, and educated on how to position pilows for sleeping to minimize strain on the back and decrease pain with sleeping.  Total time: 60 min.  PT Short Term Goals - 09/08/19 1428      PT SHORT TERM GOAL #1   Title  Patient will demonstrate improved pelvic alignment and balance of musculature surrounding the pelvis to facilitate decreased PFM spasms and decrease pelvic and LB pain.    Baseline  R up-sip and anterior rotation Vs. LLE long    Time  3    Period  Weeks    Status  New    Target Date  09/29/19      PT SHORT TERM GOAL #2   Title  Patient will report a reduction in pain to no greater than 5/10 over the prior week to demonstrate symptom improvement.    Baseline   Max pain of 9/10    Time  3    Period  Weeks    Status  New    Target Date  09/29/19      PT SHORT TERM GOAL #3   Title  Patient will demonstrate HEP x1 in the clinic to demonstrate understanding and proper form to allow for further improvement.    Baseline  Pt. lacks knowledge of therapeutic exercises that will decrease her pain and Sx.    Time  3    Period  Weeks    Status  New    Target Date  09/29/19      PT SHORT TERM GOAL #4   Title  Patient will report consistent use of foot-stool (squatty-potty) for positioning with BM to decrease pain with BM and intra-abdominal pressure.    Baseline  Pt. having a BM every 3-4 days and having to strain. Bristol stool scale 1-2    Time  3    Period  Weeks    Status  New    Target Date  09/29/19        PT Long Term Goals - 09/08/19 1442      PT LONG TERM GOAL #1   Title  Patient will describe pain no greater than 2/10 during walking and exercising at light to moderate intensity for 20-30 min to demonstrate improved functional ability and allow for improved overall health.    Baseline  has increased pain with cat/cow, bridging, walking, sitting, standing, bending, etc. up to 9/10 in the morning and at night.    Time  10    Period  Weeks    Status  New    Target Date  11/17/19      PT LONG TERM GOAL #2   Title  Patient will score at or below 25% on the PGQ to demonstrate a clinically significant decrease in disability and improved functional ability.    Baseline  PGQ: 38/75 (51%)    Time  10    Period  Weeks    Status  New    Target Date  11/17/19      PT LONG TERM GOAL #3   Title  Patient will report no episodes of SUI over the course of the prior two weeks to demonstrate improved functional ability.    Baseline  Pt. having SUI with vomiting and jumping    Time  10    Period  Weeks    Status  New    Target Date  11/17/19      PT LONG TERM GOAL #4   Title  Patient will report having BM's at least every-other day with  consistency between [facility] stool scale 3-5 over the prior week to demonstrate decreased constipation.    Baseline  Pt. having BM's every 3-4 days with bristol type 1-2 stool and straining    Time  10    Period  Weeks    Status  New    Target Date  11/17/19      PT LONG TERM GOAL #5   Title  Patient will report no pain with intercourse to demonstrate improved functional ability and allow for natural encouragement of labor when appropriate.    Baseline  Pt. having pain with deeper thrusting and ache following for "a little while"    Time  10    Period  Weeks    Status  New    Target Date  11/17/19            Plan - 09/21/19 1304    Clinical Impression Statement  Pt. Responded well to all interventions today, demonstrating improved pelvic alignment and decreased spasm and pain from 6/10 to 0/10 as well as understanding and correct performance of all education and exercises provided today. They will continue to benefit from skilled physical therapy to work toward remaining goals and maximize function as well as decrease likelihood of symptom increase or recurrence.     PT Next Visit Plan  re-check alignment and LLD following correction, give corrective exercises, educate on posture, pregnancy band and walking mechanics..    PT Home Exercise Plan  seated posterior pelvic tilts, pillow positioning in bed, sit-to-stand mechanics.    Consulted and Agree with Plan of Care  Patient       Patient will benefit from skilled therapeutic intervention in order to improve the following deficits and impairments:     Visit Diagnosis: Abnormal posture  Other muscle spasm  Sacrococcygeal disorders, not elsewhere classified     Problem List Patient Active Problem List   Diagnosis Date Noted  . Normal labor 02/02/2018  . Anemia of pregnancy in third trimester 12/22/2017  . Heartburn in pregnancy in third trimester 12/22/2017  . Nausea and vomiting in pregnancy 11/05/2017  . History of  postpartum hemorrhage 09/17/2017  . 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 Fall from standing 11/23/2011  . Edema or excessive weight gain, antepartum 10/14/2011  . Anemia 10/09/2011  . Abnormal Pap smear and cervical HPV (human papillomavirus) 10/09/2011   Willa Rough DPT, ATC Willa Rough 09/21/2019, 1:19 PM  [place] MAIN [facility] SERVICES [address] [place], [place], [address] Phone: [phone]   Fax:  [phone]  Name: Kimberly Dunn MRN: [id number] Date of Birth: 08-24-1991

## 2019-09-28 ENCOUNTER — Ambulatory Visit: Payer: Medicaid Other

## 2019-10-05 ENCOUNTER — Ambulatory Visit: Payer: Medicaid Other

## 2019-10-06 ENCOUNTER — Encounter: Payer: Self-pay | Admitting: Obstetrics and Gynecology

## 2019-10-06 ENCOUNTER — Other Ambulatory Visit: Payer: Self-pay

## 2019-10-06 ENCOUNTER — Ambulatory Visit (INDEPENDENT_AMBULATORY_CARE_PROVIDER_SITE_OTHER): Payer: Medicaid Other | Admitting: Obstetrics and Gynecology

## 2019-10-06 VITALS — BP 97/65 | HR 80 | Wt 213.0 lb

## 2019-10-06 DIAGNOSIS — Z131 Encounter for screening for diabetes mellitus: Secondary | ICD-10-CM

## 2019-10-06 DIAGNOSIS — Z3483 Encounter for supervision of other normal pregnancy, third trimester: Secondary | ICD-10-CM

## 2019-10-06 DIAGNOSIS — Z130 Encounter for screening for diseases of the blood and blood-forming organs and certain disorders involving the immune mechanism: Secondary | ICD-10-CM

## 2019-10-06 DIAGNOSIS — Z3A24 24 weeks gestation of pregnancy: Secondary | ICD-10-CM

## 2019-10-06 DIAGNOSIS — G2581 Restless legs syndrome: Secondary | ICD-10-CM

## 2019-10-06 DIAGNOSIS — R12 Heartburn: Secondary | ICD-10-CM

## 2019-10-06 DIAGNOSIS — O26893 Other specified pregnancy related conditions, third trimester: Secondary | ICD-10-CM

## 2019-10-06 LAB — POCT URINALYSIS DIPSTICK OB
Bilirubin, UA: NEGATIVE
Blood, UA: NEGATIVE
Glucose, UA: NEGATIVE
Ketones, UA: NEGATIVE
Leukocytes, UA: NEGATIVE
Nitrite, UA: NEGATIVE
Spec Grav, UA: 1.015 (ref 1.010–1.025)
Urobilinogen, UA: 0.2 E.U./dL
pH, UA: 7 (ref 5.0–8.0)

## 2019-10-06 MED ORDER — PANTOPRAZOLE SODIUM 20 MG PO TBEC: 20.0000 mg | DELAYED_RELEASE_TABLET | Freq: Two times a day (BID) | ORAL | 3 refills | Status: DC

## 2019-10-06 NOTE — Patient Instructions (Addendum)
Restless Legs Syndrome Restless legs syndrome is a condition that causes uncomfortable feelings or sensations in the legs, especially while sitting or lying down. The sensations usually cause an overwhelming urge to move the legs. The arms can also sometimes be affected. The condition can range from mild to severe. The symptoms often interfere with a person's ability to sleep. What are the causes? The cause of this condition is not known. What increases the risk? The following factors may make you more likely to develop this condition:  Being older than 50.  Pregnancy.  Being a woman. In general, the condition is more common in women than in men.  A family history of the condition.  Having iron deficiency.  Overuse of caffeine, nicotine, or alcohol.  Certain medical conditions, such as kidney disease, Parkinson's disease, or nerve damage.  Certain medicines, such as those for high blood pressure, nausea, colds, allergies, depression, and some heart conditions. What are the signs or symptoms? The main symptom of this condition is uncomfortable sensations in the legs, such as:  Pulling.  Tingling.  Prickling.  Throbbing.  Crawling.  Burning. Usually, the sensations:  Affect both sides of the body.  Are worse when you sit or lie down.  Are worse at night. These may wake you up or make it difficult to fall asleep.  Make you have a strong urge to move your legs.  Are temporarily relieved by moving your legs. The arms can also be affected, but this is rare. People who have this condition often have tiredness during the day because of their lack of sleep at night. How is this diagnosed? This condition may be diagnosed based on:  Your symptoms.  Blood tests. In some cases, you may be monitored in a sleep lab by a specialist (a sleep study). This can detect any disruptions in your sleep. How is this treated? This condition is treated by managing the symptoms. This may  include:  Lifestyle changes, such as exercising, using relaxation techniques, and avoiding caffeine, alcohol, or tobacco.  Medicines. Anti-seizure medicines may be tried first. Follow these instructions at home:     General instructions  Take over-the-counter and prescription medicines only as told by your health care provider.  Use methods to help relieve the uncomfortable sensations, such as: ? Massaging your legs. ? Walking or stretching. ? Taking a cold or hot bath.  Keep all follow-up visits as told by your health care provider. This is important. Lifestyle  Practice good sleep habits. For example, go to bed and get up at the same time every day. Most adults should get 7-9 hours of sleep each night.  Exercise regularly. Try to get at least 30 minutes of exercise most days of the week.  Practice ways of relaxing, such as yoga or meditation.  Avoid caffeine and alcohol.  Do not use any products that contain nicotine or tobacco, such as cigarettes and e-cigarettes. If you need help quitting, ask your health care provider. Contact a health care provider if:  Your symptoms get worse or they do not improve with treatment. Summary  Restless legs syndrome is a condition that causes uncomfortable feelings or sensations in the legs, especially while sitting or lying down.  The symptoms often interfere with a person's ability to sleep.  This condition is treated by managing the symptoms. You may need to make lifestyle changes or take medicines. This information is not intended to replace advice given to you by your health care provider. Make sure   you discuss any questions you have with your health care provider. Document Revised: 09/15/2017 Document Reviewed: 09/15/2017 Elsevier Patient Education  [employer].   Heartburn During Pregnancy  Heartburn is pain or discomfort in the throat or chest. It may cause a burning feeling. It happens when stomach acid moves up into  the tube that carries food from your mouth to your stomach (esophagus). Heartburn is common during pregnancy. It usually goes away or gets better after giving birth. Follow these instructions at home: Eating and drinking  Do not drink alcohol while you are pregnant.  Figure out which foods and beverages make you feel worse, and avoid them.  Beverages that you may want to avoid include: ? Coffee and tea (with or without caffeine). ? Energy drinks and sports drinks. ? Bubbly (carbonated) drinks or sodas. ? Citrus fruit juices.  Foods that you may want to avoid include: ? Chocolate and cocoa. ? Peppermint and mint flavorings. ? Garlic, onions, and horseradish. ? Spicy and acidic foods. These include peppers, chili powder, curry powder, vinegar, hot sauces, and barbecue sauce. ? Citrus fruits, such as oranges, lemons, and limes. ? Tomato-based foods, such as red sauce, chili, and salsa. ? Fried and fatty foods, such as donuts, french fries, potato chips, and high-fat dressings. ? High-fat meats, such as hot dogs, cold cuts, sausage, ham, and bacon. ? High-fat dairy items, such as whole milk, butter, and cheese.  Eat small meals often, instead of large meals.  Avoid drinking a lot of liquid with your meals.  Avoid eating meals during the 2-3 hours before you go to bed.  Avoid lying down right after you eat.  Do not exercise right after you eat. Medicines  Take over-the-counter and prescription medicines only as told by your doctor.  Do not take aspirin, ibuprofen, or other NSAIDs unless your doctor tells you to do that.  Your doctor may tell you to avoid medicines that have sodium bicarbonate in them. General instructions   If told, raise the head of your bed about 6 inches (15 cm). You can do this by putting blocks under the legs. Sleeping with more pillows does not help with heartburn.  Do not use any products that contain nicotine or tobacco, such as cigarettes and  e-cigarettes. If you need help quitting, ask your doctor.  Wear loose-fitting clothing.  Try to lower your stress, such as with yoga or meditation. If you need help, ask your doctor.  Stay at a healthy weight. If you are overweight, work with your doctor to safely lose weight.  Keep all follow-up visits as told by your doctor. This is important. Contact a doctor if:  You get new symptoms.  Your symptoms do not get better with treatment.  You have weight loss and you do not know why.  You have trouble swallowing.  You make loud sounds when you breathe (wheeze).  You have a cough that does not go away.  You have heartburn often for more than 2 weeks.  You feel sick to your stomach (nauseous), and this does not get better with treatment.  You are throwing up (vomiting), and this does not get better with treatment.  You have pain in your belly (abdomen). Get help right away if:  You have very bad chest pain that spreads to your arm, neck, or jaw.  You feel sweaty, dizzy, or light-headed.  You have trouble breathing.  You have pain when swallowing.  You throw up and your throw-up looks  like blood or coffee grounds.  Your poop (stool) is bloody or black. This information is not intended to replace advice given to you by your health care provider. Make sure you discuss any questions you have with your health care provider. Document Revised: 12/17/2018 Document Reviewed: 05/13/2016 Elsevier Patient Education  2020 [employer].

## 2019-10-06 NOTE — Progress Notes (Signed)
ROB: Complains of bad heartburn, not relieved by Tums. Prescribed Protonix. Discussed dietary cravings. Also noted restless leg. Discussed alleviating measures. RTC in 4 weeks, for 28 week labs at that time.

## 2019-10-06 NOTE — Progress Notes (Signed)
[name] present for routine prenatal care. Pt stated having acid reflux really bad. Pt declined breastfeeding/prenatal classes.

## 2019-10-13 ENCOUNTER — Other Ambulatory Visit: Payer: Self-pay

## 2019-10-13 ENCOUNTER — Ambulatory Visit: Payer: Medicaid Other | Attending: Obstetrics and Gynecology

## 2019-10-13 DIAGNOSIS — M62838 Other muscle spasm: Secondary | ICD-10-CM | POA: Insufficient documentation

## 2019-10-13 DIAGNOSIS — M533 Sacrococcygeal disorders, not elsewhere classified: Secondary | ICD-10-CM | POA: Insufficient documentation

## 2019-10-13 DIAGNOSIS — R293 Abnormal posture: Secondary | ICD-10-CM | POA: Diagnosis not present

## 2019-10-13 NOTE — Patient Instructions (Signed)
  Hold for 30 seconds (5 deep breaths) and repeat Repeat 2-3 times on the right, at least 1 time to the left   Get your hips turned to the side, tuck your pelvis under and hold for 5 deep breaths. Repeat 2-3 times on the right, at least 1 time to the left  HIP Extension - Standing    Stand with support. Squeeze deep core and hold. Start with leg to the back on your toe and gently squeeze the buttock to lift the toe off of the ground. Hold for 1 second then repeat. You should feel this in the outer hip, not the front, not your side,  And not your back. Repeat __10_ times. Do _3__ sets  a day.

## 2019-10-13 NOTE — Therapy (Signed)
[facility] MAIN [facility] SERVICES [address] [place], [place], [address] Phone: [phone]   Fax:  [phone]  Physical Therapy Treatment  The patient has been informed of current processes in place at Outpatient Rehab to protect patients from Covid-19 exposure including social distancing, schedule modifications, and new cleaning procedures. After discussing their particular risk with a therapist based on the patient's personal risk factors, the patient has decided to proceed with in-person therapy.   Patient Details  Name: Kimberly Dunn MRN: [id number] Date of Birth: 1991/09/01 No data recorded  Encounter Date: 10/13/2019  PT End of Session - 10/13/19 1331    Visit Number  3    Number of Visits  12    Date for PT Re-Evaluation  11/17/19    Authorization Type  mcaid 2-3 through 4-13 (10 visits)    Authorization Time Period  09/08/2019 through 10/06/2019    Authorization - Visit Number  1    Authorization - Number of Visits  10    PT Start Time  1100    PT Stop Time  1200    PT Time Calculation (min)  60 min    Activity Tolerance  Patient tolerated treatment well;No increased pain    Behavior During Therapy  WFL for tasks assessed/performed       Past Medical History:  Diagnosis Date  . Abnormal Pap smear   . Anemia   . HPV (human papilloma virus) infection     Past Surgical History:  Procedure Laterality Date  . CHOLECYSTECTOMY    . CONDYLOMA EXCISION/FULGURATION    . TONSILLECTOMY      There were no vitals filed for this visit.    Pelvic Floor Physical Therapy Treatment Note  SCREENING  Changes in medications, allergies, or medical history?: none    SUBJECTIVE  Patient reports: Walked for ~ 30 min. After the last treatment and had pain in the anterior hip she "walked it out" and then felt better the next day. Is walking 3-4 miles with her friend. It is better most of the time when she gets up in the morning. She  still has "coldness in her bones" after walk or during walk in her R SIJ and anterior hip. Notices that her R foot wants to turn in. Feels really stiff first thing in the morning for 3-4 min.  Precautions:  [redacted] weeks pregnant  Pain update:  Location of pain: RLB and hip Current pain:  4/10  Max pain:  7/10 Least pain:  6/10 Nature of pain: dull achy  **no pain following treatment  Patient Goals: To be able to walk for exercise for 30 min. Without pain.     OBJECTIVE  Changes in: Posture/Observations:  R up-slip and anterior rotation. Leg-length normal Pt. Walks in with antalgic gait, slow cautious steps, avoiding loading the RLE as much as possible.   Range of Motion/Flexibilty:  Decreased sacral mobility along the L sacral border.   Strength/MMT:  LE MMT:  Pelvic floor:  Abdominal:   Palpation: TTP to R QL, Psoas, Iliacus, Piriformis, and OI.  Gait Analysis:  INTERVENTIONS THIS SESSION: Manual: performed TP release to R QL, Psoas, Iliacus, Piriformis, and OI to decrease spasm and pain and allow for improved balance of musculature for improved function and decreased symptoms. This was followed by grade side-lying R up-slip correction to improve pelvic alignment and decrease pain.  Therex: educated on and practiced side-stretch, hip-flexor stretch, and standing hip EXT To maintain and improve  muscle length and allow for improved balance of musculature for long-term symptom relief and to improve strength of muscles opposing tight musculature to allow reciprocal inhibition to improve balance of musculature surrounding the pelvis and improve overall posture for optimal musculature length-tension relationship and function.  Self-care: Educated on maternity band to take pressure off of the low back with walking. Educated on potential of low magnesium, to as MD and look into 8 sheep magnesium lotion if needed to prevent diarrhea.  Total time: 60  min.                              PT Short Term Goals - 09/08/19 1428      PT SHORT TERM GOAL #1   Title  Patient will demonstrate improved pelvic alignment and balance of musculature surrounding the pelvis to facilitate decreased PFM spasms and decrease pelvic and LB pain.    Baseline  R up-sip and anterior rotation Vs. LLE long    Time  3    Period  Weeks    Status  New    Target Date  09/29/19      PT SHORT TERM GOAL #2   Title  Patient will report a reduction in pain to no greater than 5/10 over the prior week to demonstrate symptom improvement.    Baseline  Max pain of 9/10    Time  3    Period  Weeks    Status  New    Target Date  09/29/19      PT SHORT TERM GOAL #3   Title  Patient will demonstrate HEP x1 in the clinic to demonstrate understanding and proper form to allow for further improvement.    Baseline  Pt. lacks knowledge of therapeutic exercises that will decrease her pain and Sx.    Time  3    Period  Weeks    Status  New    Target Date  09/29/19      PT SHORT TERM GOAL #4   Title  Patient will report consistent use of foot-stool (squatty-potty) for positioning with BM to decrease pain with BM and intra-abdominal pressure.    Baseline  Pt. having a BM every 3-4 days and having to strain. [place] stool scale 1-2    Time  3    Period  Weeks    Status  New    Target Date  09/29/19        PT Long Term Goals - 09/08/19 1442      PT LONG TERM GOAL #1   Title  Patient will describe pain no greater than 2/10 during walking and exercising at light to moderate intensity for 20-30 min to demonstrate improved functional ability and allow for improved overall health.    Baseline  has increased pain with cat/cow, bridging, walking, sitting, standing, bending, etc. up to 9/10 in the morning and at night.    Time  10    Period  Weeks    Status  New    Target Date  11/17/19      PT LONG TERM GOAL #2   Title  Patient will score at or below  25% on the PGQ to demonstrate a clinically significant decrease in disability and improved functional ability.    Baseline  PGQ: 38/75 (51%)    Time  10    Period  Weeks    Status  New    Target Date  11/17/19      PT LONG TERM GOAL #3   Title  Patient will report no episodes of SUI over the course of the prior two weeks to demonstrate improved functional ability.    Baseline  Pt. having SUI with vomiting and jumping    Time  10    Period  Weeks    Status  New    Target Date  11/17/19      PT LONG TERM GOAL #4   Title  Patient will report having BM's at least every-other day with consistency between [facility] stool scale 3-5 over the prior week to demonstrate decreased constipation.    Baseline  Pt. having BM's every 3-4 days with bristol type 1-2 stool and straining    Time  10    Period  Weeks    Status  New    Target Date  11/17/19      PT LONG TERM GOAL #5   Title  Patient will report no pain with intercourse to demonstrate improved functional ability and allow for natural encouragement of labor when appropriate.    Baseline  Pt. having pain with deeper thrusting and ache following for "a little while"    Time  10    Period  Weeks    Status  New    Target Date  11/17/19            Plan - 10/13/19 1333    Clinical Impression Statement  Pt. Responded well to all interventions today, demonstrating improved pelvic alignment, decreased pain, as well as understanding and correct performance of all education and exercises provided today. They will continue to benefit from skilled physical therapy to work toward remaining goals and maximize function as well as decrease likelihood of symptom increase or recurrence.     PT Next Visit Plan  re-check alignment and LLD following correction, give corrective exercises, educate on posture, pregnancy band and walking mechanics..    PT Home Exercise Plan  seated posterior pelvic tilts, pillow positioning in bed, sit-to-stand mechanics.     Consulted and Agree with Plan of Care  Patient       Patient will benefit from skilled therapeutic intervention in order to improve the following deficits and impairments:     Visit Diagnosis: Abnormal posture  Other muscle spasm  Sacrococcygeal disorders, not elsewhere classified     Problem List Patient Active Problem List   Diagnosis Date Noted  . Normal labor 02/02/2018  . Anemia of pregnancy in third trimester 12/22/2017  . Heartburn in pregnancy in third trimester 12/22/2017  . Nausea and vomiting in pregnancy 11/05/2017  . History of postpartum hemorrhage 09/17/2017  . 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 Fall from standing 11/23/2011  . Edema or excessive weight gain, antepartum 10/14/2011  . Anemia 10/09/2011  . Abnormal Pap smear and cervical HPV (human papillomavirus) 10/09/2011   Willa Rough DPT, ATC Willa Rough 10/13/2019, 1:34 PM  [place] MAIN [facility] SERVICES [address] [place], [place], [address] Phone: [phone]   Fax:  [phone]  Name: Kimberly Dunn MRN: [id number] Date of Birth: 11-08-1990

## 2019-10-19 ENCOUNTER — Other Ambulatory Visit: Payer: Self-pay

## 2019-10-19 ENCOUNTER — Ambulatory Visit: Payer: Medicaid Other

## 2019-10-19 DIAGNOSIS — M533 Sacrococcygeal disorders, not elsewhere classified: Secondary | ICD-10-CM

## 2019-10-19 DIAGNOSIS — R293 Abnormal posture: Secondary | ICD-10-CM

## 2019-10-19 DIAGNOSIS — M62838 Other muscle spasm: Secondary | ICD-10-CM | POA: Diagnosis not present

## 2019-10-19 NOTE — Therapy (Signed)
[place] MAIN [facility] SERVICES [address] [place], [place], [address] Phone: [phone]   Fax:  [phone]  Physical Therapy Treatment  The patient has been informed of current processes in place at Outpatient Rehab to protect patients from Covid-19 exposure including social distancing, schedule modifications, and new cleaning procedures. After discussing their particular risk with a therapist based on the patient's personal risk factors, the patient has decided to proceed with in-person therapy.   Patient Details  Name: Kimberly Dunn MRN: [id number] Date of Birth: 08-21-1991 No data recorded  Encounter Date: 10/19/2019  PT End of Session - 10/19/19 1157    Visit Number  4    Number of Visits  12    Date for PT Re-Evaluation  11/17/19    Authorization Type  mcaid 2-3 through 4-13 (10 visits)    Authorization Time Period  09/08/2019 through 10/06/2019    Authorization - Visit Number  2    Authorization - Number of Visits  10    PT Start Time  9798    PT Stop Time  1135    PT Time Calculation (min)  60 min    Activity Tolerance  Patient tolerated treatment well;No increased pain    Behavior During Therapy  WFL for tasks assessed/performed       Past Medical History:  Diagnosis Date  . Abnormal Pap smear   . Anemia   . HPV (human papilloma virus) infection     Past Surgical History:  Procedure Laterality Date  . CHOLECYSTECTOMY    . CONDYLOMA EXCISION/FULGURATION    . TONSILLECTOMY      There were no vitals filed for this visit.    Pelvic Floor Physical Therapy Treatment Note  SCREENING  Changes in medications, allergies, or medical history?: none    SUBJECTIVE  Patient reports: She is still having difficulty sleeping/resless legs, and has Pica, wanting to eat carpet freshener. Did a bunch of squats to try to tire herself out and now she is having pelvic pain. It calmed down when took a hot shower, massaged her  legs.    Precautions:  [redacted] weeks pregnant  Pain update:  Location of pain: RLB and hip Current pain:  0/10  Max pain:  7/10 Least pain:  0/10 Nature of pain: dull achy  **no pain following treatment   Patient Goals: To be able to walk for exercise for 30 min. Without pain.     OBJECTIVE  Changes in: Posture/Observations:  ASIS and PSIS level, R pubic ramus slightly low in supine -all even following treatment.   Range of Motion/Flexibilty:   Strength/MMT:  LE MMT:  Pelvic floor:  Abdominal:   Palpation: TTP to B rectus abdominus insertions, R Iliacus  Gait Analysis: Pt. Able to walk with a faster cadence and less fear following treatment with SI belt.  INTERVENTIONS THIS SESSION: Manual: performed TP release to B rectus abdominus insertions, R Iliacus followed by grade 3 rotational/ inferior to superior mobs to the R innominate to improve pubic symphysis alignment and prevent pain with transitions and walking.  Therex: educated on and practiced squats to encourage use of glutes and protect knees/hips and prevent increased pain while strengthening the pelvic girdle. Educated on how to safely return to walking for exercise with small incremental increases and listening to her body as well as using her SI belt at first.  Self-care: Educated further on how her restless leg and pica symptoms are likely connected to her nutritional  intake and encouraged her to look at the [email or web address] info on nutritional intake as well as talk to her doctors about whether she can use Iron, Zinc, and magnesium supplements safely/how. Educated on and given an SI belt to improve pelvic stability and educated on when to/not to wear it.  Total time: 60 min.                            PT Short Term Goals - 09/08/19 1428      PT SHORT TERM GOAL #1   Title  Patient will demonstrate improved pelvic alignment and balance of musculature surrounding the pelvis to facilitate  decreased PFM spasms and decrease pelvic and LB pain.    Baseline  R up-sip and anterior rotation Vs. LLE long    Time  3    Period  Weeks    Status  New    Target Date  09/29/19      PT SHORT TERM GOAL #2   Title  Patient will report a reduction in pain to no greater than 5/10 over the prior week to demonstrate symptom improvement.    Baseline  Max pain of 9/10    Time  3    Period  Weeks    Status  New    Target Date  09/29/19      PT SHORT TERM GOAL #3   Title  Patient will demonstrate HEP x1 in the clinic to demonstrate understanding and proper form to allow for further improvement.    Baseline  Pt. lacks knowledge of therapeutic exercises that will decrease her pain and Sx.    Time  3    Period  Weeks    Status  New    Target Date  09/29/19      PT SHORT TERM GOAL #4   Title  Patient will report consistent use of foot-stool (squatty-potty) for positioning with BM to decrease pain with BM and intra-abdominal pressure.    Baseline  Pt. having a BM every 3-4 days and having to strain. Bristol stool scale 1-2    Time  3    Period  Weeks    Status  New    Target Date  09/29/19        PT Long Term Goals - 09/08/19 1442      PT LONG TERM GOAL #1   Title  Patient will describe pain no greater than 2/10 during walking and exercising at light to moderate intensity for 20-30 min to demonstrate improved functional ability and allow for improved overall health.    Baseline  has increased pain with cat/cow, bridging, walking, sitting, standing, bending, etc. up to 9/10 in the morning and at night.    Time  10    Period  Weeks    Status  New    Target Date  11/17/19      PT LONG TERM GOAL #2   Title  Patient will score at or below 25% on the PGQ to demonstrate a clinically significant decrease in disability and improved functional ability.    Baseline  PGQ: 38/75 (51%)    Time  10    Period  Weeks    Status  New    Target Date  11/17/19      PT LONG TERM GOAL #3   Title   Patient will report no episodes of SUI over the course of the prior two weeks to demonstrate improved  functional ability.    Baseline  Pt. having SUI with vomiting and jumping    Time  10    Period  Weeks    Status  New    Target Date  11/17/19      PT LONG TERM GOAL #4   Title  Patient will report having BM's at least every-other day with consistency between [facility] stool scale 3-5 over the prior week to demonstrate decreased constipation.    Baseline  Pt. having BM's every 3-4 days with bristol type 1-2 stool and straining    Time  10    Period  Weeks    Status  New    Target Date  11/17/19      PT LONG TERM GOAL #5   Title  Patient will report no pain with intercourse to demonstrate improved functional ability and allow for natural encouragement of labor when appropriate.    Baseline  Pt. having pain with deeper thrusting and ache following for "a little while"    Time  10    Period  Weeks    Status  New    Target Date  11/17/19            Plan - 10/19/19 1141    Clinical Impression Statement  Pt. responded well to all interventions today, demonstrating fully improved pelvic alignment and decreased pain with bed mobility/transfers, resolution of LBP with addition of SIJ and improved stability/decreased fear as well as improved understanding of the importance of nutrition and vitamin intake in her overall health and fetal health. She will continue to benefit from skilled PT to improve strength and stability of the pelvic girdle and prevent return of pain/instability.    PT Next Visit Plan  educate on posture and walking mechanics, deep-core strengthening. adduction with ball for pubic symphysis?    PT Home Exercise Plan  seated posterior pelvic tilts, pillow positioning in bed, sit-to-stand mechanics, squats, SI belt, walking.    Consulted and Agree with Plan of Care  Patient       Patient will benefit from skilled therapeutic intervention in order to improve the following  deficits and impairments:     Visit Diagnosis: Abnormal posture  Other muscle spasm  Sacrococcygeal disorders, not elsewhere classified     Problem List Patient Active Problem List   Diagnosis Date Noted  . Normal labor 02/02/2018  . Anemia of pregnancy in third trimester 12/22/2017  . Heartburn in pregnancy in third trimester 12/22/2017  . Nausea and vomiting in pregnancy 11/05/2017  . History of postpartum hemorrhage 09/17/2017  . 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 Fall from standing 11/23/2011  . Edema or excessive weight gain, antepartum 10/14/2011  . Anemia 10/09/2011  . Abnormal Pap smear and cervical HPV (human papillomavirus) 10/09/2011   Cleophus Molt DPT, ATC Cleophus Molt 10/19/2019, 11:57 AM  [place] [facility] MAIN [facility] SERVICES [address] [place], [place], [address] Phone: [phone]   Fax:  [phone]  Name: Kimberly Dunn MRN: [id number] Date of Birth: July 10, 1991

## 2019-10-19 NOTE — Patient Instructions (Signed)
Start walking ~ 10 min. To see how it goes. If your pain increases by 1-2 points but goes down with just rest/heat then you can stay at the same time (10 min.) if no pain, you can increase by ~ 5 min. Per day. If pain > 2/10 back off a little.  Wear your SI belt (that I gave you) when you are on your feet/active. Do not wear it when resting/in bed.    Keep your trunk as one unit and let it hinge forward from the hips as you push your bottom back and bend your knees at the same rate that you bend your hips. Keep your weight back toward your heels but do not actually lift the toes off the ground. Exhale starting just before and all the way through standing to help engage the glutes and lower tummy muscles.  Do 1 set of 10 tonight, increase by 1 set of 10 each night if no problem. Try to work up to ~ 50 squats per day.

## 2019-10-26 ENCOUNTER — Other Ambulatory Visit: Payer: Self-pay

## 2019-10-26 ENCOUNTER — Ambulatory Visit: Payer: Medicaid Other

## 2019-10-26 DIAGNOSIS — M62838 Other muscle spasm: Secondary | ICD-10-CM

## 2019-10-26 DIAGNOSIS — M533 Sacrococcygeal disorders, not elsewhere classified: Secondary | ICD-10-CM

## 2019-10-26 DIAGNOSIS — R293 Abnormal posture: Secondary | ICD-10-CM

## 2019-10-26 NOTE — Patient Instructions (Signed)
   Breathe in, let belly relax down toward the floor and then breathe out, pulling the lower belly in toward the backbone.   Repeat this _2x15_ times _1-5__ times per day   Breathe out and pull the knees together with a small ball, a folded pillow, or your fists between your knees. Do 2x15 or continue as long as the pain is decreasing. You only need to do this to decrease pain, not daily.

## 2019-10-26 NOTE — Therapy (Signed)
[place] [facility] MAIN [facility] SERVICES [address] [place], [place], [address] Phone: [phone]   Fax:  [phone]  Physical Therapy Treatment  The patient has been informed of current processes in place at Outpatient Rehab to protect patients from Covid-19 exposure including social distancing, schedule modifications, and new cleaning procedures. After discussing their particular risk with a therapist based on the patient's personal risk factors, the patient has decided to proceed with in-person therapy.   Patient Details  Name: Kimberly Dunn MRN: [id number] Date of Birth: Jan 16, 1991 No data recorded  Encounter Date: 10/26/2019  PT End of Session - 10/26/19 1144    Visit Number  5    Number of Visits  12    Date for PT Re-Evaluation  11/17/19    Authorization Type  mcaid 2-3 through 4-13 (10 visits)    Authorization Time Period  09/08/2019 through 10/06/2019    Authorization - Visit Number  3    Authorization - Number of Visits  10    PT Start Time  1035    PT Stop Time  1130    PT Time Calculation (min)  55 min    Activity Tolerance  Patient tolerated treatment well;No increased pain    Behavior During Therapy  WFL for tasks assessed/performed       Past Medical History:  Diagnosis Date  . Abnormal Pap smear   . Anemia   . HPV (human papilloma virus) infection     Past Surgical History:  Procedure Laterality Date  . CHOLECYSTECTOMY    . CONDYLOMA EXCISION/FULGURATION    . TONSILLECTOMY      There were no vitals filed for this visit.     Pelvic Floor Physical Therapy Treatment Note  SCREENING  Changes in medications, allergies, or medical history?: none    SUBJECTIVE  Patient reports: Has been wearing her SI belt often and feels like it helps a lot. Her pain is not as bad and less frequent, feeling pretty good. She has a pain at the pubic symphysis occasionally but not as often. She has not been great about doing her  exercises. She wrote a note to her doctor and they told her that they would check her Iron but to go ahead and start taking iron. She feels like it has already  Helped with the restless legs and carpet freshener cravings some.  Precautions:  [redacted] weeks pregnant  Pain update:  Location of pain: R pubic symphysis Current pain:  5/10  Max pain:  6/10 Least pain:  0/10 Nature of pain: dull achy  **no pain following treatment   Patient Goals: To be able to walk for exercise for 30 min. Without pain.     OBJECTIVE  Changes in: Posture/Observations:  Mild pubic symphysis malalignment   Range of Motion/Flexibilty:   Strength/MMT:  LE MMT:  Pelvic floor:  Abdominal:  Pt. Able to engage TA followed by obliques with Min. TC,VC.  Palpation:  Gait Analysis: Pt. Able to walk with a faster cadence and less fear following treatment with SI belt.  INTERVENTIONS THIS SESSION:  Therex: educated on and practiced TA recruitment in quadruped to strengthen the deep-core and off-load the back and hips to allow for improved stability and maintenence of decreased pain. Encouraged Pt. To begin walking program as discussed at previous visit. Educated on and performed 2x15 seated adduction with a soft ball for resistance to improve pelvic alignment and decrease pain.  Self-care: Educated on how to get more  of the nutrients she needs for her and the baby through her diet while not feeling hungry and getting used to eating more frequent, smaller meals as well as increasing her water intake to decrease constipation from increased iron and improve health while also preparing her mentally in case she does have gestational diabetes when they do her glucose tests.  Total time: 60 min.                             PT Short Term Goals - 09/08/19 1428      PT SHORT TERM GOAL #1   Title  Patient will demonstrate improved pelvic alignment and balance of musculature surrounding the  pelvis to facilitate decreased PFM spasms and decrease pelvic and LB pain.    Baseline  R up-sip and anterior rotation Vs. LLE long    Time  3    Period  Weeks    Status  New    Target Date  09/29/19      PT SHORT TERM GOAL #2   Title  Patient will report a reduction in pain to no greater than 5/10 over the prior week to demonstrate symptom improvement.    Baseline  Max pain of 9/10    Time  3    Period  Weeks    Status  New    Target Date  09/29/19      PT SHORT TERM GOAL #3   Title  Patient will demonstrate HEP x1 in the clinic to demonstrate understanding and proper form to allow for further improvement.    Baseline  Pt. lacks knowledge of therapeutic exercises that will decrease her pain and Sx.    Time  3    Period  Weeks    Status  New    Target Date  09/29/19      PT SHORT TERM GOAL #4   Title  Patient will report consistent use of foot-stool (squatty-potty) for positioning with BM to decrease pain with BM and intra-abdominal pressure.    Baseline  Pt. having a BM every 3-4 days and having to strain. Bristol stool scale 1-2    Time  3    Period  Weeks    Status  New    Target Date  09/29/19        PT Long Term Goals - 09/08/19 1442      PT LONG TERM GOAL #1   Title  Patient will describe pain no greater than 2/10 during walking and exercising at light to moderate intensity for 20-30 min to demonstrate improved functional ability and allow for improved overall health.    Baseline  has increased pain with cat/cow, bridging, walking, sitting, standing, bending, etc. up to 9/10 in the morning and at night.    Time  10    Period  Weeks    Status  New    Target Date  11/17/19      PT LONG TERM GOAL #2   Title  Patient will score at or below 25% on the PGQ to demonstrate a clinically significant decrease in disability and improved functional ability.    Baseline  PGQ: 38/75 (51%)    Time  10    Period  Weeks    Status  New    Target Date  11/17/19      PT LONG TERM  GOAL #3   Title  Patient will report no episodes of SUI  over the course of the prior two weeks to demonstrate improved functional ability.    Baseline  Pt. having SUI with vomiting and jumping    Time  10    Period  Weeks    Status  New    Target Date  11/17/19      PT LONG TERM GOAL #4   Title  Patient will report having BM's at least every-other day with consistency between [facility] stool scale 3-5 over the prior week to demonstrate decreased constipation.    Baseline  Pt. having BM's every 3-4 days with bristol type 1-2 stool and straining    Time  10    Period  Weeks    Status  New    Target Date  11/17/19      PT LONG TERM GOAL #5   Title  Patient will report no pain with intercourse to demonstrate improved functional ability and allow for natural encouragement of labor when appropriate.    Baseline  Pt. having pain with deeper thrusting and ache following for "a little while"    Time  10    Period  Weeks    Status  New    Target Date  11/17/19            Plan - 10/26/19 1144    Clinical Impression Statement  Pt. Responded well to all interventions today, demonstrating improved pubic symphysis alignment, resolution of pain, as well as understanding and correct performance of all education and exercises provided today. They will continue to benefit from skilled physical therapy to work toward remaining goals and maximize function as well as decrease likelihood of symptom increase or recurrence.     PT Next Visit Plan  educate on posture and walking mechanics, deep-core strengthening. adduction with ball for pubic symphysis?    PT Home Exercise Plan  seated posterior pelvic tilts, pillow positioning in bed, sit-to-stand mechanics, squats, SI belt, walking, pregnancy nutrition, TA in quad, seated adduction.    Consulted and Agree with Plan of Care  Patient       Patient will benefit from skilled therapeutic intervention in order to improve the following deficits and  impairments:     Visit Diagnosis: Abnormal posture  Other muscle spasm  Sacrococcygeal disorders, not elsewhere classified     Problem List Patient Active Problem List   Diagnosis Date Noted  . Normal labor 02/02/2018  . Anemia of pregnancy in third trimester 12/22/2017  . Heartburn in pregnancy in third trimester 12/22/2017  . Nausea and vomiting in pregnancy 11/05/2017  . History of postpartum hemorrhage 09/17/2017  . 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 Fall from standing 11/23/2011  . Edema or excessive weight gain, antepartum 10/14/2011  . Anemia 10/09/2011  . Abnormal Pap smear and cervical HPV (human papillomavirus) 10/09/2011   Willa Rough DPT, ATC Willa Rough 10/26/2019, 11:46 AM  [place] MAIN [facility] SERVICES [address] [place], [place], [address] Phone: [phone]   Fax:  [phone]  Name: Deaunna Olarte MRN: [id number] Date of Birth: 1991/08/09

## 2019-11-02 ENCOUNTER — Ambulatory Visit: Payer: Medicaid Other

## 2019-11-03 ENCOUNTER — Encounter: Payer: Self-pay | Admitting: Obstetrics and Gynecology

## 2019-11-03 ENCOUNTER — Other Ambulatory Visit: Payer: Medicaid Other

## 2019-11-03 ENCOUNTER — Other Ambulatory Visit: Payer: Self-pay

## 2019-11-03 ENCOUNTER — Ambulatory Visit (INDEPENDENT_AMBULATORY_CARE_PROVIDER_SITE_OTHER): Payer: Medicaid Other | Admitting: Obstetrics and Gynecology

## 2019-11-03 VITALS — BP 98/63 | HR 80 | Wt 223.0 lb

## 2019-11-03 DIAGNOSIS — Z130 Encounter for screening for diseases of the blood and blood-forming organs and certain disorders involving the immune mechanism: Secondary | ICD-10-CM

## 2019-11-03 DIAGNOSIS — Z131 Encounter for screening for diabetes mellitus: Secondary | ICD-10-CM

## 2019-11-03 DIAGNOSIS — Z3483 Encounter for supervision of other normal pregnancy, third trimester: Secondary | ICD-10-CM

## 2019-11-03 LAB — POCT URINALYSIS DIPSTICK OB
Bilirubin, UA: NEGATIVE
Blood, UA: NEGATIVE
Glucose, UA: NEGATIVE
Ketones, UA: NEGATIVE
Leukocytes, UA: NEGATIVE
Nitrite, UA: NEGATIVE
Spec Grav, UA: 1.01 (ref 1.010–1.025)
Urobilinogen, UA: 0.2 E.U./dL
pH, UA: 7 (ref 5.0–8.0)

## 2019-11-03 MED ORDER — TETANUS-DIPHTH-ACELL PERTUSSIS 5-2.5-18.5 LF-MCG/0.5 IM SUSP
0.5000 mL | Freq: Once | INTRAMUSCULAR | Status: AC
  Administered 2019-11-03: 0.5 mL via INTRAMUSCULAR

## 2019-11-03 NOTE — Progress Notes (Signed)
Patient comes in today for ROB visit. Patient is having cravings.

## 2019-11-03 NOTE — Progress Notes (Signed)
ROB: 1 hour GCT and CBC today.  Patient has pica.  She craves carpet and would like to eat carpet.  She is tempted to small carpet cleaner all day long and to even eat the dust from her vacuum.  We have discussed this in some detail I recommended other more appropriate things to eat and to chew on.  I have counseled her against buying carpet samples and eating them.  We will consider iron infusions if she is found to have a low hemoglobin.  Patient is requesting to be seen every 2 weeks.

## 2019-11-05 LAB — CBC
Hematocrit: 30.3 % — ABNORMAL LOW (ref 34.0–46.6)
Hemoglobin: 9.9 g/dL — ABNORMAL LOW (ref 11.1–15.9)
MCH: 28.5 pg (ref 26.6–33.0)
MCHC: 32.7 g/dL (ref 31.5–35.7)
MCV: 87 fL (ref 79–97)
Platelets: 231 10*3/uL (ref 150–450)
RBC: 3.47 x10E6/uL — ABNORMAL LOW (ref 3.77–5.28)
RDW: 13 % (ref 11.7–15.4)
WBC: 7.5 10*3/uL (ref 3.4–10.8)

## 2019-11-05 LAB — RPR, QUANT+TP ABS (REFLEX)
Rapid Plasma Reagin, Quant: 1:4 {titer} — ABNORMAL HIGH
T Pallidum Abs: REACTIVE — AB

## 2019-11-05 LAB — GLUCOSE, 1 HOUR GESTATIONAL: Gestational Diabetes Screen: 118 mg/dL (ref 65–139)

## 2019-11-05 LAB — RPR: RPR Ser Ql: REACTIVE — AB

## 2019-11-07 ENCOUNTER — Other Ambulatory Visit: Payer: Self-pay | Admitting: Obstetrics and Gynecology

## 2019-11-07 DIAGNOSIS — F5089 Other specified eating disorder: Secondary | ICD-10-CM | POA: Insufficient documentation

## 2019-11-07 MED ORDER — FERROUS SULFATE 325 (65 FE) MG PO TABS: 325.0000 mg | ORAL_TABLET | Freq: Two times a day (BID) | ORAL | 1 refills | Status: DC

## 2019-11-07 MED ORDER — DOCUSATE SODIUM 100 MG PO CAPS: 100.0000 mg | ORAL_CAPSULE | Freq: Two times a day (BID) | ORAL | 2 refills | Status: DC | PRN

## 2019-11-10 ENCOUNTER — Other Ambulatory Visit: Payer: Self-pay

## 2019-11-10 ENCOUNTER — Ambulatory Visit: Payer: Medicaid Other | Attending: Obstetrics and Gynecology

## 2019-11-10 DIAGNOSIS — R293 Abnormal posture: Secondary | ICD-10-CM | POA: Insufficient documentation

## 2019-11-10 DIAGNOSIS — M62838 Other muscle spasm: Secondary | ICD-10-CM | POA: Diagnosis not present

## 2019-11-10 DIAGNOSIS — M533 Sacrococcygeal disorders, not elsewhere classified: Secondary | ICD-10-CM | POA: Insufficient documentation

## 2019-11-10 NOTE — Patient Instructions (Addendum)
     [facility] Department [address] [place], [place] [address] [phone]  Call and ask to get signed up for [facility] so you can get nutrition counseling/dietician to help you control your PICA and get more energy and make sure your son has adequate nutrition.  Keep taking your Iron in the mean time. Take Mirilax as needed, decrease if you start having really loose stools but don't just stop    Tuck your hips under, then keep the tuck as you lean forward so you feel a stretch through your low back. Hold for 5 deep breaths, repeat 2-3 times, 1-2 times per day.  Child's Pose Pelvic Floor Lengthening    Sit in knee-chest position and reach arms forward. Separate knees for comfort. Hold position for _5__ breaths. Repeat _2-3__ times. Do _1-2__ times per day.      Available in 32 oz, 1/2 gallon, or 1 gallon sizes, all colors...... To help you increase your water intake, try getting a reusable water bottle with markings to help you keep up with your hydration throughout the day. You should be drinking ~ 50% of your bodyweight in Oz. Of water each day or an average of ~ 80 Oz. (5-6 16oz bottles of water)   Avoid Caffeine, citrus, cranberry, alcohol   You can also add fruit or cucumbers to your water to make it more interesting and desirable. Cucumbers and honeydew are good choices because they are not acidic like citrus fruit and they add a refreshing flavor.

## 2019-11-10 NOTE — Therapy (Signed)
[place] [facility] MAIN [facility] SERVICES [address] [place], [place], [address] Phone: [phone]   Fax:  [phone]  Physical Therapy Treatment  The patient has been informed of current processes in place at Outpatient Rehab to protect patients from Covid-19 exposure including social distancing, schedule modifications, and new cleaning procedures. After discussing their particular risk with a therapist based on the patient's personal risk factors, the patient has decided to proceed with in-person therapy.   Patient Details  Name: Kimberly Dunn MRN: [id number] Date of Birth: 1990-12-31 No data recorded  Encounter Date: 11/10/2019  PT End of Session - 11/11/19 1614    Visit Number  6    Number of Visits  12    Date for PT Re-Evaluation  11/17/19    Authorization Type  mcaid 2-3 through 4-13 (10 visits)    Authorization Time Period  09/08/2019 through 10/06/2019    Authorization - Visit Number  4    Authorization - Number of Visits  10    PT Start Time  1100    PT Stop Time  1200    PT Time Calculation (min)  60 min    Activity Tolerance  Patient tolerated treatment well;No increased pain    Behavior During Therapy  WFL for tasks assessed/performed       Past Medical History:  Diagnosis Date  . Abnormal Pap smear   . Anemia   . HPV (human papilloma virus) infection     Past Surgical History:  Procedure Laterality Date  . CHOLECYSTECTOMY    . CONDYLOMA EXCISION/FULGURATION    . TONSILLECTOMY      There were no vitals filed for this visit.    Pelvic Floor Physical Therapy Treatment Note  SCREENING  Changes in medications, allergies, or medical history?: none    SUBJECTIVE  Patient reports: Stopped taking her Iron because it has made her really constipated. Low back hurts more after she works out and cools down. Nocturia x4,   Drinking 3-4 16 oz. Bottles   Precautions:  [redacted] weeks pregnant  Pain update:  Location of  pain: LB Current pain:  2/10  Max pain:  6/10 Least pain:  0/10 Nature of pain: dull achy  **no pain following treatment   Patient Goals: To be able to walk for exercise for 30 min. Without pain.     OBJECTIVE  Changes in: Posture/Observations:  Pt. Appears very tired, gait WNL   Range of Motion/Flexibilty:   Strength/MMT:  LE MMT:  Pelvic floor:  Abdominal:    Palpation:  Gait Analysis: Pt. Able to walk with a faster cadence and less fear following treatment with SI belt.  INTERVENTIONS THIS SESSION:  Therex: Educated on and practiced Low back stretch, Child's pose stretch To decrease PFM and low back tension, decrease urinary frequency, maintain and improve muscle length and allow for improved balance of musculature for long-term symptom relief.  Self-care:Rreviewed importance of taking BOTH vitamins and eating well due to PICA and pregnancy, Instructed to call health department and get set up with [facility] and nutrition counseling to decrease PICA Sx, fatigue, and improve sleep and nutrition. Educated on ways to manage constipation including increasing water, mirilax, and hot liquids rather than not taking her Iron.   Total time: 60 min.                            PT Short Term Goals - 09/08/19 1428  PT SHORT TERM GOAL #1   Title  Patient will demonstrate improved pelvic alignment and balance of musculature surrounding the pelvis to facilitate decreased PFM spasms and decrease pelvic and LB pain.    Baseline  R up-sip and anterior rotation Vs. LLE long    Time  3    Period  Weeks    Status  New    Target Date  09/29/19      PT SHORT TERM GOAL #2   Title  Patient will report a reduction in pain to no greater than 5/10 over the prior week to demonstrate symptom improvement.    Baseline  Max pain of 9/10    Time  3    Period  Weeks    Status  New    Target Date  09/29/19      PT SHORT TERM GOAL #3   Title  Patient will demonstrate  HEP x1 in the clinic to demonstrate understanding and proper form to allow for further improvement.    Baseline  Pt. lacks knowledge of therapeutic exercises that will decrease her pain and Sx.    Time  3    Period  Weeks    Status  New    Target Date  09/29/19      PT SHORT TERM GOAL #4   Title  Patient will report consistent use of foot-stool (squatty-potty) for positioning with BM to decrease pain with BM and intra-abdominal pressure.    Baseline  Pt. having a BM every 3-4 days and having to strain. Bristol stool scale 1-2    Time  3    Period  Weeks    Status  New    Target Date  09/29/19        PT Long Term Goals - 09/08/19 1442      PT LONG TERM GOAL #1   Title  Patient will describe pain no greater than 2/10 during walking and exercising at light to moderate intensity for 20-30 min to demonstrate improved functional ability and allow for improved overall health.    Baseline  has increased pain with cat/cow, bridging, walking, sitting, standing, bending, etc. up to 9/10 in the morning and at night.    Time  10    Period  Weeks    Status  New    Target Date  11/17/19      PT LONG TERM GOAL #2   Title  Patient will score at or below 25% on the PGQ to demonstrate a clinically significant decrease in disability and improved functional ability.    Baseline  PGQ: 38/75 (51%)    Time  10    Period  Weeks    Status  New    Target Date  11/17/19      PT LONG TERM GOAL #3   Title  Patient will report no episodes of SUI over the course of the prior two weeks to demonstrate improved functional ability.    Baseline  Pt. having SUI with vomiting and jumping    Time  10    Period  Weeks    Status  New    Target Date  11/17/19      PT LONG TERM GOAL #4   Title  Patient will report having BM's at least every-other day with consistency between [facility] stool scale 3-5 over the prior week to demonstrate decreased constipation.    Baseline  Pt. having BM's every 3-4 days with bristol  type 1-2 stool and  straining    Time  10    Period  Weeks    Status  New    Target Date  11/17/19      PT LONG TERM GOAL #5   Title  Patient will report no pain with intercourse to demonstrate improved functional ability and allow for natural encouragement of labor when appropriate.    Baseline  Pt. having pain with deeper thrusting and ache following for "a little while"    Time  10    Period  Weeks    Status  New    Target Date  11/17/19            Plan - 11/11/19 1614    Clinical Impression Statement  Pt. Responded well to all interventions today, demonstrating improved understanding of what resources are available to her to improve her understanding of nutrition and availability of nutrition for her and her babies, decreased LBP, as well as understanding and correct performance of all education and exercises provided today. They will continue to benefit from skilled physical therapy to work toward remaining goals and maximize function as well as decrease likelihood of symptom increase or recurrence.     PT Next Visit Plan  educate on posture and walking mechanics, deep-core strengthening. adduction with ball for pubic symphysis?    PT Home Exercise Plan  seated posterior pelvic tilts, pillow positioning in bed, sit-to-stand mechanics, squats, SI belt, walking, pregnancy nutrition, TA in quad, seated adduction, WIC/dietician, .       Patient will benefit from skilled therapeutic intervention in order to improve the following deficits and impairments:     Visit Diagnosis: Abnormal posture  Other muscle spasm  Sacrococcygeal disorders, not elsewhere classified     Problem List Patient Active Problem List   Diagnosis Date Noted  . Pica in adults 11/07/2019  . Normal labor 02/02/2018  . Anemia of pregnancy in third trimester 12/22/2017  . Heartburn in pregnancy in third trimester 12/22/2017  . Nausea and vomiting in pregnancy 11/05/2017  . History of postpartum  hemorrhage 09/17/2017  . 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 Fall from standing 11/23/2011  . Edema or excessive weight gain, antepartum 10/14/2011  . Anemia 10/09/2011  . Abnormal Pap smear and cervical HPV (human papillomavirus) 10/09/2011   Cleophus Molt DPT, ATC Cleophus Molt 11/11/2019, 4:16 PM  [facility] MAIN [facility] SERVICES [address] [place], [place], [address] Phone: [phone]   Fax:  [phone]  Name: Kimberly Dunn MRN: [id number] Date of Birth: [birth year]

## 2019-11-16 MED ORDER — FERRALET 90 90-1 MG PO TABS: 1.0000 | ORAL_TABLET | Freq: Every day | ORAL | 3 refills | Status: DC

## 2019-11-17 ENCOUNTER — Encounter: Payer: Medicaid Other | Admitting: Obstetrics and Gynecology

## 2019-11-18 ENCOUNTER — Ambulatory Visit: Payer: Medicaid Other

## 2019-11-23 NOTE — Progress Notes (Signed)
[name] present for her routine prenatal care. Pt stated that is taking the iron pills but is really constipated even with taking the stool softeners. Pt stated being tired, lack of energy and no drive to do anything.

## 2019-11-24 ENCOUNTER — Encounter: Payer: Self-pay | Admitting: Obstetrics and Gynecology

## 2019-11-24 ENCOUNTER — Ambulatory Visit: Payer: Medicaid Other

## 2019-11-24 ENCOUNTER — Ambulatory Visit (INDEPENDENT_AMBULATORY_CARE_PROVIDER_SITE_OTHER): Payer: Medicaid Other | Admitting: Obstetrics and Gynecology

## 2019-11-24 ENCOUNTER — Other Ambulatory Visit: Payer: Self-pay

## 2019-11-24 VITALS — BP 101/65 | HR 104 | Wt 224.8 lb

## 2019-11-24 DIAGNOSIS — Z3483 Encounter for supervision of other normal pregnancy, third trimester: Secondary | ICD-10-CM

## 2019-11-24 DIAGNOSIS — K5903 Drug induced constipation: Secondary | ICD-10-CM

## 2019-11-24 DIAGNOSIS — Z3A31 31 weeks gestation of pregnancy: Secondary | ICD-10-CM

## 2019-11-24 LAB — POCT URINALYSIS DIPSTICK OB
Bilirubin, UA: NEGATIVE
Blood, UA: NEGATIVE
Glucose, UA: NEGATIVE
Ketones, UA: NEGATIVE
Leukocytes, UA: NEGATIVE
Nitrite, UA: NEGATIVE
Spec Grav, UA: 1.02 (ref 1.010–1.025)
Urobilinogen, UA: 0.2 E.U./dL
pH, UA: 6 (ref 5.0–8.0)

## 2019-11-24 NOTE — Patient Instructions (Signed)

## 2019-11-24 NOTE — Progress Notes (Signed)
ROB: Patient notes increasing her iron intake to BID, now noting constipation. Has been taking stool softeners but not helping, can use Miralax.  Also given samples of Ferralet. Considering Nexplanon for contraception but will get 1 dose of Depo Provera while inpatient. Normal 28 week labs except for known anemia. RTC in 2 weeks.

## 2019-12-01 ENCOUNTER — Ambulatory Visit: Payer: Medicaid Other

## 2019-12-01 ENCOUNTER — Other Ambulatory Visit: Payer: Self-pay

## 2019-12-01 DIAGNOSIS — M62838 Other muscle spasm: Secondary | ICD-10-CM

## 2019-12-01 DIAGNOSIS — M533 Sacrococcygeal disorders, not elsewhere classified: Secondary | ICD-10-CM | POA: Diagnosis not present

## 2019-12-01 DIAGNOSIS — R293 Abnormal posture: Secondary | ICD-10-CM | POA: Diagnosis not present

## 2019-12-01 NOTE — Therapy (Signed)
[place] [facility] MAIN [facility] SERVICES [address] [place], [place], [address] Phone: [phone]   Fax:  [phone]  Physical Therapy Treatment  The patient has been informed of current processes in place at Outpatient Rehab to protect patients from Covid-19 exposure including social distancing, schedule modifications, and new cleaning procedures. After discussing their particular risk with a therapist based on the patient's personal risk factors, the patient has decided to proceed with in-person therapy.   Patient Details  Name: Kimberly Dunn MRN: [id number] Date of Birth: 1991-06-20 No data recorded  Encounter Date: 12/01/2019  PT End of Session - 12/01/19 1316    Visit Number  7    Number of Visits  12    Date for PT Re-Evaluation  11/17/19    Authorization Type  mcaid 2-3 through 4-13 (10 visits)    Authorization Time Period  09/08/2019 through 10/06/2019    Authorization - Visit Number  5    Authorization - Number of Visits  10    Progress Note Due on Visit  10    PT Start Time  0912    PT Stop Time  1000    PT Time Calculation (min)  48 min    Activity Tolerance  Patient tolerated treatment well;No increased pain    Behavior During Therapy  WFL for tasks assessed/performed       Past Medical History:  Diagnosis Date  . Abnormal Pap smear   . Anemia   . HPV (human papilloma virus) infection     Past Surgical History:  Procedure Laterality Date  . CHOLECYSTECTOMY    . CONDYLOMA EXCISION/FULGURATION    . TONSILLECTOMY      There were no vitals filed for this visit.     Pelvic Floor Physical Therapy Treatment Note  SCREENING  Changes in medications, allergies, or medical history?: none    SUBJECTIVE  Patient reports: Was able to get [facility], had her nutrition appointment, they are trying to help her get her iron levels up. She has been moody and tired. Her daughter wants to sleep in the bed with her and so she is having  a hard time sleeping well. She occasionally has discomfort in the Right hip from sleeping awkwardly and having to carry her daughter because she is in a really clingy phase. Has been trying to be active/working out through the day, walking at least a mile per day but still has trouble sleeping.   Precautions:  [redacted] weeks pregnant  Pain update:  Location of pain: LB Current pain:  0/10  Max pain:  3/10 Least pain:  0/10 Nature of pain: dull achy  **no pain following treatment   Patient Goals: To be able to walk for exercise for 30 min. Without pain.     OBJECTIVE  Changes in: Posture/Observations:  Hyperlordosis  Range of Motion/Flexibilty:   Strength/MMT:  LE MMT:   Pelvic Floor External Exam: Introitus Appears: WNL Skin integrity: WNL Palpation: TTP to L bulbo, B STP ans IC. Cough: parodoxical Prolapse visible?: N/A Scar mobility: not assessed Coordination: able to squeeze, release, and bulge.  Internal Vaginal Exam: Strength (PERF): 3/5, 3 seconds,1 time Symmetry: L>R for TTP Palpation: TTP throughout with greatest at anterior PR/PC B Prolapse: N/A  Abdominal:    Palpation: Mild TTP to B proximal adductors.  Gait Analysis:  INTERVENTIONS THIS SESSION:  Manual: Performed PFM assessment followed by TP release internally to all muscles on the R and to anterior and posterior PR/PC  on the L to decrease spasm and pain and allow for improved balance of musculature for improved function and decreased symptoms.  Theract: Educated on how to perform self external PFM release and posterior fourchette release to allow for decreased PFM spasm, dyspareunia, constipation, and to allow for decreased risk of tearing with vaginal delivery.  Total time: 60 min.                            PT Short Term Goals - 09/08/19 1428      PT SHORT TERM GOAL #1   Title  Patient will demonstrate improved pelvic alignment and balance of musculature surrounding  the pelvis to facilitate decreased PFM spasms and decrease pelvic and LB pain.    Baseline  R up-sip and anterior rotation Vs. LLE long    Time  3    Period  Weeks    Status  New    Target Date  09/29/19      PT SHORT TERM GOAL #2   Title  Patient will report a reduction in pain to no greater than 5/10 over the prior week to demonstrate symptom improvement.    Baseline  Max pain of 9/10    Time  3    Period  Weeks    Status  New    Target Date  09/29/19      PT SHORT TERM GOAL #3   Title  Patient will demonstrate HEP x1 in the clinic to demonstrate understanding and proper form to allow for further improvement.    Baseline  Pt. lacks knowledge of therapeutic exercises that will decrease her pain and Sx.    Time  3    Period  Weeks    Status  New    Target Date  09/29/19      PT SHORT TERM GOAL #4   Title  Patient will report consistent use of foot-stool (squatty-potty) for positioning with BM to decrease pain with BM and intra-abdominal pressure.    Baseline  Pt. having a BM every 3-4 days and having to strain. Bristol stool scale 1-2    Time  3    Period  Weeks    Status  New    Target Date  09/29/19        PT Long Term Goals - 09/08/19 1442      PT LONG TERM GOAL #1   Title  Patient will describe pain no greater than 2/10 during walking and exercising at light to moderate intensity for 20-30 min to demonstrate improved functional ability and allow for improved overall health.    Baseline  has increased pain with cat/cow, bridging, walking, sitting, standing, bending, etc. up to 9/10 in the morning and at night.    Time  10    Period  Weeks    Status  New    Target Date  11/17/19      PT LONG TERM GOAL #2   Title  Patient will score at or below 25% on the PGQ to demonstrate a clinically significant decrease in disability and improved functional ability.    Baseline  PGQ: 38/75 (51%)    Time  10    Period  Weeks    Status  New    Target Date  11/17/19      PT LONG  TERM GOAL #3   Title  Patient will report no episodes of SUI over the course of the prior  two weeks to demonstrate improved functional ability.    Baseline  Pt. having SUI with vomiting and jumping    Time  10    Period  Weeks    Status  New    Target Date  11/17/19      PT LONG TERM GOAL #4   Title  Patient will report having BM's at least every-other day with consistency between [facility] stool scale 3-5 over the prior week to demonstrate decreased constipation.    Baseline  Pt. having BM's every 3-4 days with bristol type 1-2 stool and straining    Time  10    Period  Weeks    Status  New    Target Date  11/17/19      PT LONG TERM GOAL #5   Title  Patient will report no pain with intercourse to demonstrate improved functional ability and allow for natural encouragement of labor when appropriate.    Baseline  Pt. having pain with deeper thrusting and ache following for "a little while"    Time  10    Period  Weeks    Status  New    Target Date  11/17/19            Plan - 12/01/19 1317    Clinical Impression Statement  Pt. Responded well to all interventions today, demonstrating decreased spasm and TTP as well as understanding and correct performance of all education and exercises provided today. They will continue to benefit from skilled physical therapy to work toward remaining goals and maximize function as well as decrease likelihood of symptom increase or recurrence.     PT Next Visit Plan  educate on posture and walking mechanics, deep-core strengthening. adduction with ball for pubic symphysis? further release to L>R PFM, reassess external and posterior fourchette and ask about self release attempts.    PT Home Exercise Plan  seated posterior pelvic tilts, pillow positioning in bed, sit-to-stand mechanics, squats, SI belt, walking, pregnancy nutrition, TA in quad, seated adduction, WIC/dietician, self posterior fourchette and external PFM release    Consulted and Agree with  Plan of Care  Patient       Patient will benefit from skilled therapeutic intervention in order to improve the following deficits and impairments:     Visit Diagnosis: Abnormal posture  Other muscle spasm  Sacrococcygeal disorders, not elsewhere classified     Problem List Patient Active Problem List   Diagnosis Date Noted  . Pica in adults 11/07/2019  . Normal labor 02/02/2018  . Anemia of pregnancy in third trimester 12/22/2017  . Heartburn in pregnancy in third trimester 12/22/2017  . Nausea and vomiting in pregnancy 11/05/2017  . History of postpartum hemorrhage 09/17/2017  . 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 Fall from standing 11/23/2011  . Edema or excessive weight gain, antepartum 10/14/2011  . Anemia 10/09/2011  . Abnormal Pap smear and cervical HPV (human papillomavirus) 10/09/2011   Willa Rough DPT, ATC Willa Rough 12/01/2019, 1:25 PM  [place] MAIN [facility] SERVICES [address] [place], [place], [address] Phone: [phone]   Fax:  [phone]  Name: Kimberly Dunn MRN: [id number] Date of Birth: 1991-01-10

## 2019-12-01 NOTE — Patient Instructions (Addendum)
   Try using Unisom to help sleep  Self External Trigger Point Relief    1) Wash your hands and prop yourself up in a way where you can easily reach the vagina. You may wish to have a small hand-held mirror near by.  2) Use the 2 middle fingers to put gentle pressure on the three external pelvic floor muscles and hold pressure and take deep breaths as you allow the tension to release and discomfort to dissipate   3) Repeat the process for any trigger points you find spending between 3-10 minutes on this every 1-2 days until you do not find any more trigger points or you are told otherwise by your therapist.  Self Posterior Fourchette Stretching/Mobilization    1) Wash your hands and prop your body up so you can easily reach the vagina, bring hand-held mirror if desired.  2) Apply lubricant to the thumb and vaginal opening  3) Place thumb ~ 1/2 an inch into the vagina with the pad of the thumb pointed down and apply gentle pressure to the posterior fourchette.  4) Gently sweep the thumb side to side and in/out while maintaining pressure down toward the anus. Make sure the pressure is not so great that your muscles tighten up and guard, just enough to create slight discomfort.  Do this for ~ 3 min. Per night to decrease tightness and tenderness at the vaginal opening.

## 2019-12-07 ENCOUNTER — Ambulatory Visit: Payer: Medicaid Other

## 2019-12-07 ENCOUNTER — Other Ambulatory Visit: Payer: Self-pay

## 2019-12-07 DIAGNOSIS — M533 Sacrococcygeal disorders, not elsewhere classified: Secondary | ICD-10-CM | POA: Diagnosis not present

## 2019-12-07 DIAGNOSIS — R293 Abnormal posture: Secondary | ICD-10-CM

## 2019-12-07 DIAGNOSIS — M62838 Other muscle spasm: Secondary | ICD-10-CM

## 2019-12-07 NOTE — Therapy (Addendum)
[place] [facility] MAIN [facility] SERVICES [address] [place], [place], [address] Phone: [phone]   Fax:  [phone]  Physical Therapy Treatment  The patient has been informed of current processes in place at Outpatient Rehab to protect patients from Covid-19 exposure including social distancing, schedule modifications, and new cleaning procedures. After discussing their particular risk with a therapist based on the patient's personal risk factors, the patient has decided to proceed with in-person therapy.   Patient Details  Name: Kimberly Dunn MRN: [id number] Date of Birth: 05-09-1991 No data recorded  Encounter Date: 12/07/2019    Past Medical History:  Diagnosis Date  . Abnormal Pap smear   . Anemia   . HPV (human papilloma virus) infection     Past Surgical History:  Procedure Laterality Date  . CHOLECYSTECTOMY    . CONDYLOMA EXCISION/FULGURATION    . TONSILLECTOMY      There were no vitals filed for this visit.  Pelvic Floor Physical Therapy Treatment Note  SCREENING  Changes in medications, allergies, or medical history?: none    SUBJECTIVE  Patient reports: Has been pooping really well and has even been able to come off of her stool softener. Has started to have some lower abdomen discomfort/cramping. Has been having insomnia, up until 4 am, then struggles to fall asleep. Eating breakfast around 1-2 in the afternoon. A bowl of cereal or something for "breakfast" and trying to eat a sandwich or something at night. Taking prenatals and Iron. Did work on the posterior fourchette once, felt release but it was not as bad as when we released in the clinic. Has been wearing her belly band. Uses the SIJ belt when she does squats, etc. Has been doing home workouts   Precautions:  [redacted] weeks pregnant  Pain update:  Location of pain: lower abdomen Current pain:  0/10  Max pain:  2/10 Least pain:  0/10 Nature of pain:  crampy  **no increased pain following treatment.  Patient Goals: To be able to walk for exercise for 30 min. Without pain.     OBJECTIVE  Changes in: Posture/Observations:  Hyperlordosis  Range of Motion/Flexibilty:   Strength/MMT:  LE MMT:   Pelvic Floor (From prior visit) [External Exam: Introitus Appears: WNL Skin integrity: WNL Palpation: TTP to L bulbo, B STP ans IC. Cough: parodoxical Prolapse visible?: N/A Scar mobility: not assessed Coordination: able to squeeze, release, and bulge.  Internal Vaginal Exam: Strength (PERF): 3/5, 3 seconds,1 time Symmetry: L>R for TTP Palpation: TTP throughout with greatest at anterior PR/PC B Prolapse: N/A]  Abdominal:   Palpation: (from prior visit) Mild TTP to B proximal adductors.  Gait Analysis:  INTERVENTIONS THIS SESSION:  Self-care: Educated on making sure she is getting sleep, talking to her Dr. Leodis Binet using Unisom as well as checking her magnesium levels to further improve restless leg syndrome and discussing her depression symptoms including lack of appetite and insomnia to improve overall health and get prepared for delivery and post-partum hormone fluctuations. Encouraged to look into topical magnesium cream ([employer]) if levels low rather than oral to prevent diarrhea. Reviewed how to differentiate braxton hicks contractions from real ones, when to contact her dr. And when to wait and time/monitor contractions rest and elevate her feet or go for walk.  Therex: encouraged Pt. to start walking more regularly, to wean herself slowly off of using the SIJ belt to strengthen pelvis, reviewed adduction with ball for pubic symphysis.  Total time: 60 min.  PT Short Term Goals - 09/08/19 1428      PT SHORT TERM GOAL #1   Title  Patient will demonstrate improved pelvic alignment and balance of musculature surrounding the pelvis to facilitate  decreased PFM spasms and decrease pelvic and LB pain.    Baseline  R up-sip and anterior rotation Vs. LLE long    Time  3    Period  Weeks    Status  New    Target Date  09/29/19      PT SHORT TERM GOAL #2   Title  Patient will report a reduction in pain to no greater than 5/10 over the prior week to demonstrate symptom improvement.    Baseline  Max pain of 9/10    Time  3    Period  Weeks    Status  New    Target Date  09/29/19      PT SHORT TERM GOAL #3   Title  Patient will demonstrate HEP x1 in the clinic to demonstrate understanding and proper form to allow for further improvement.    Baseline  Pt. lacks knowledge of therapeutic exercises that will decrease her pain and Sx.    Time  3    Period  Weeks    Status  New    Target Date  09/29/19      PT SHORT TERM GOAL #4   Title  Patient will report consistent use of foot-stool (squatty-potty) for positioning with BM to decrease pain with BM and intra-abdominal pressure.    Baseline  Pt. having a BM every 3-4 days and having to strain. Bristol stool scale 1-2    Time  3    Period  Weeks    Status  New    Target Date  09/29/19        PT Long Term Goals - 09/08/19 1442      PT LONG TERM GOAL #1   Title  Patient will describe pain no greater than 2/10 during walking and exercising at light to moderate intensity for 20-30 min to demonstrate improved functional ability and allow for improved overall health.    Baseline  has increased pain with cat/cow, bridging, walking, sitting, standing, bending, etc. up to 9/10 in the morning and at night.    Time  10    Period  Weeks    Status  New    Target Date  11/17/19      PT LONG TERM GOAL #2   Title  Patient will score at or below 25% on the PGQ to demonstrate a clinically significant decrease in disability and improved functional ability.    Baseline  PGQ: 38/75 (51%)    Time  10    Period  Weeks    Status  New    Target Date  11/17/19      PT LONG TERM GOAL #3   Title   Patient will report no episodes of SUI over the course of the prior two weeks to demonstrate improved functional ability.    Baseline  Pt. having SUI with vomiting and jumping    Time  10    Period  Weeks    Status  New    Target Date  11/17/19      PT LONG TERM GOAL #4   Title  Patient will report having BM's at least every-other day with consistency between [facility] stool scale 3-5 over the prior week to demonstrate decreased constipation.    Baseline  Pt. having BM's every 3-4 days with bristol type 1-2 stool and straining    Time  10    Period  Weeks    Status  New    Target Date  11/17/19      PT LONG TERM GOAL #5   Title  Patient will report no pain with intercourse to demonstrate improved functional ability and allow for natural encouragement of labor when appropriate.    Baseline  Pt. having pain with deeper thrusting and ache following for "a little while"    Time  10    Period  Weeks    Status  New    Target Date  11/17/19            Plan - 12/14/19 0903    Clinical Impression Statement  Pt. Responded well to all interventions today, demonstrating improved understanding of what contractions feel like and when to notify her Dr., how to minimize Pubic symphysis pain, how to wean herself off of SI belt to further strengthee pelvic girdle, as well as understanding and correct performance of all education and exercises provided today. They will continue to benefit from skilled physical therapy to work toward remaining goals and maximize function as well as decrease likelihood of symptom increase or recurrence.     PT Next Visit Plan  educate on standing posture and walking mechanics, deep-core strengthening. ? further release to L>R PFM, reassess external and posterior fourchette and ask about self release attempts.    PT Home Exercise Plan  seated posterior pelvic tilts, pillow positioning in bed, sit-to-stand mechanics, squats, SI belt, walking, pregnancy nutrition, TA in  quad, seated adduction, WIC/dietician, self posterior fourchette and external PFM release    Consulted and Agree with Plan of Care  Patient       Patient will benefit from skilled therapeutic intervention in order to improve the following deficits and impairments:     Visit Diagnosis: Abnormal posture  Other muscle spasm  Sacrococcygeal disorders, not elsewhere classified     Problem List Patient Active Problem List   Diagnosis Date Noted  . Pica in adults 11/07/2019  . Normal labor 02/02/2018  . Anemia of pregnancy in third trimester 12/22/2017  . Heartburn in pregnancy in third trimester 12/22/2017  . Nausea and vomiting in pregnancy 11/05/2017  . History of postpartum hemorrhage 09/17/2017  . 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 Fall from standing 11/23/2011  . Edema or excessive weight gain, antepartum 10/14/2011  . Anemia 10/09/2011  . Abnormal Pap smear and cervical HPV (human papillomavirus) 10/09/2011   Willa Rough DPT, ATC Willa Rough 12/14/2019, 9:06 AM  [place] MAIN [facility] SERVICES [address] [place], [place], [address] Phone: [phone]   Fax:  [phone]  Name: Katurah Karapetian MRN: [id number] Date of Birth: 1990-11-26

## 2019-12-07 NOTE — Patient Instructions (Signed)
   Look for Unisom to help you sleep.   Talk to Dr. Valentino Saxon about checking manesium/ and depression symptoms/ lack of appetite and insomnia.  Look into [employer] magnesium lotion if levels low to prevent diarrhea.   Start to time contractions if they do not go away with putting feet up, drinking water, if you cannot talk through them or they are getting consistent and close together. Before 37-38 weeks call your Dr. If you are experiencing this.

## 2019-12-08 ENCOUNTER — Encounter: Payer: Self-pay | Admitting: Obstetrics and Gynecology

## 2019-12-08 ENCOUNTER — Other Ambulatory Visit: Payer: Self-pay

## 2019-12-08 ENCOUNTER — Ambulatory Visit (INDEPENDENT_AMBULATORY_CARE_PROVIDER_SITE_OTHER): Payer: Medicaid Other | Admitting: Obstetrics and Gynecology

## 2019-12-08 VITALS — BP 100/66 | HR 87 | Wt 226.3 lb

## 2019-12-08 DIAGNOSIS — Z3A33 33 weeks gestation of pregnancy: Secondary | ICD-10-CM

## 2019-12-08 DIAGNOSIS — Z3483 Encounter for supervision of other normal pregnancy, third trimester: Secondary | ICD-10-CM

## 2019-12-08 LAB — POCT URINALYSIS DIPSTICK OB
Bilirubin, UA: NEGATIVE
Blood, UA: NEGATIVE
Glucose, UA: NEGATIVE
Ketones, UA: NEGATIVE
Leukocytes, UA: NEGATIVE
Nitrite, UA: NEGATIVE
Spec Grav, UA: 1.01 (ref 1.010–1.025)
Urobilinogen, UA: 0.2 E.U./dL
pH, UA: 8 (ref 5.0–8.0)

## 2019-12-08 NOTE — [place] Notes (Signed)
ROB: No complaints.  Stool softeners worked too well-patient now has occasional diarrhea.  Does complain of some lower abdominal tightenings with exertion.  After discussion they are likely [facility].  Signs and symptoms of preterm labor discussed.

## 2019-12-15 ENCOUNTER — Ambulatory Visit: Payer: Medicaid Other

## 2019-12-21 ENCOUNTER — Ambulatory Visit (INDEPENDENT_AMBULATORY_CARE_PROVIDER_SITE_OTHER): Payer: Medicaid Other | Admitting: Obstetrics and Gynecology

## 2019-12-21 ENCOUNTER — Other Ambulatory Visit: Payer: Self-pay

## 2019-12-21 ENCOUNTER — Encounter: Payer: Self-pay | Admitting: Obstetrics and Gynecology

## 2019-12-21 VITALS — BP 104/68 | HR 99 | Wt 229.6 lb

## 2019-12-21 DIAGNOSIS — A530 Latent syphilis, unspecified as early or late: Secondary | ICD-10-CM

## 2019-12-21 DIAGNOSIS — Z3483 Encounter for supervision of other normal pregnancy, third trimester: Secondary | ICD-10-CM

## 2019-12-21 DIAGNOSIS — Z3A35 35 weeks gestation of pregnancy: Secondary | ICD-10-CM

## 2019-12-21 LAB — POCT URINALYSIS DIPSTICK OB
Bilirubin, UA: NEGATIVE
Blood, UA: NEGATIVE
Glucose, UA: NEGATIVE
Ketones, UA: NEGATIVE
Leukocytes, UA: NEGATIVE
Nitrite, UA: NEGATIVE
Spec Grav, UA: 1.025 (ref 1.010–1.025)
Urobilinogen, UA: 0.2 E.U./dL
pH, UA: 6 (ref 5.0–8.0)

## 2019-12-21 NOTE — Patient Instructions (Signed)

## 2019-12-21 NOTE — [place] Notes (Signed)
[name] present for routine prenatal care. Pt stated that she noticed contraction when she was walking with her mother-in-law she felt cramping. Pt stated having vaginal pressure/pain.

## 2019-12-21 NOTE — [place] Notes (Signed)
ROB: Complains of cramping in pelvis.  Knows this is normal at this point in pregnancy.  Answered questions regarding COVID testing at labor and delivlery and risk of transmission to fetus if positive.  Answered questions regarding her RPR titers and delivery.  RTC in 1 week.

## 2019-12-23 ENCOUNTER — Ambulatory Visit: Payer: Medicaid Other | Attending: Obstetrics and Gynecology

## 2019-12-23 DIAGNOSIS — M62838 Other muscle spasm: Secondary | ICD-10-CM | POA: Insufficient documentation

## 2019-12-23 DIAGNOSIS — M533 Sacrococcygeal disorders, not elsewhere classified: Secondary | ICD-10-CM | POA: Insufficient documentation

## 2019-12-23 DIAGNOSIS — R293 Abnormal posture: Secondary | ICD-10-CM | POA: Insufficient documentation

## 2019-12-28 ENCOUNTER — Encounter: Payer: Self-pay | Admitting: Obstetrics and Gynecology

## 2019-12-28 ENCOUNTER — Other Ambulatory Visit: Payer: Self-pay

## 2019-12-28 ENCOUNTER — Ambulatory Visit (INDEPENDENT_AMBULATORY_CARE_PROVIDER_SITE_OTHER): Payer: Medicaid Other | Admitting: Obstetrics and Gynecology

## 2019-12-28 VITALS — BP 97/63 | HR 81 | Wt 233.2 lb

## 2019-12-28 DIAGNOSIS — Z3A36 36 weeks gestation of pregnancy: Secondary | ICD-10-CM | POA: Diagnosis not present

## 2019-12-28 DIAGNOSIS — Z3483 Encounter for supervision of other normal pregnancy, third trimester: Secondary | ICD-10-CM | POA: Diagnosis not present

## 2019-12-28 DIAGNOSIS — R12 Heartburn: Secondary | ICD-10-CM

## 2019-12-28 DIAGNOSIS — O26893 Other specified pregnancy related conditions, third trimester: Secondary | ICD-10-CM

## 2019-12-28 LAB — POCT URINALYSIS DIPSTICK OB
Bilirubin, UA: NEGATIVE
Blood, UA: NEGATIVE
Glucose, UA: NEGATIVE
Ketones, UA: NEGATIVE
Leukocytes, UA: NEGATIVE
Nitrite, UA: NEGATIVE
POC,PROTEIN,UA: NEGATIVE
Spec Grav, UA: 1.01 (ref 1.010–1.025)
Urobilinogen, UA: 0.2 E.U./dL
pH, UA: 5.5 (ref 5.0–8.0)

## 2019-12-28 MED ORDER — ESOMEPRAZOLE MAGNESIUM 20 MG PO PACK: 20.0000 mg | PACK | Freq: Every day | ORAL | 2 refills | Status: DC

## 2019-12-28 NOTE — [place] Notes (Signed)
ROB: Patient states she has occasional contractions/cramping but not regular.  Continues to experience heartburn despite use of Protonix-prescription for Nexium given.  GBS-GC/CT performed today.  Patient request cervical exam. Signs and symptoms of labor discussed.

## 2019-12-30 LAB — GC/CHLAMYDIA PROBE AMP
Chlamydia trachomatis, NAA: NEGATIVE
Neisseria Gonorrhoeae by PCR: NEGATIVE

## 2019-12-30 LAB — STREP GP B NAA: Strep Gp B NAA: NEGATIVE

## 2019-12-31 ENCOUNTER — Ambulatory Visit: Payer: Medicaid Other

## 2019-12-31 ENCOUNTER — Other Ambulatory Visit: Payer: Self-pay

## 2019-12-31 DIAGNOSIS — R293 Abnormal posture: Secondary | ICD-10-CM

## 2019-12-31 DIAGNOSIS — M62838 Other muscle spasm: Secondary | ICD-10-CM

## 2019-12-31 DIAGNOSIS — M533 Sacrococcygeal disorders, not elsewhere classified: Secondary | ICD-10-CM | POA: Diagnosis not present

## 2019-12-31 NOTE — Therapy (Signed)
[place] MAIN [facility] SERVICES [address] [place], [place], [address] Phone: [phone]   Fax:  [phone]  Physical Therapy Treatment  The patient has been informed of current processes in place at Outpatient Rehab to protect patients from Covid-19 exposure including social distancing, schedule modifications, and new cleaning procedures. After discussing their particular risk with a therapist based on the patient's personal risk factors, the patient has decided to proceed with in-person therapy.   Patient Details  Name: Kimberly Dunn MRN: [id number] Date of Birth: 1991-05-27 No data recorded  Encounter Date: 12/31/2019  PT End of Session - 01/03/20 0732    Visit Number  9    Number of Visits  12    Date for PT Re-Evaluation  11/17/19    Authorization Type  4-15 through 5-12 4 visits    Authorization Time Period  09/08/2019 through 10/06/2019    Authorization - Visit Number  7    Authorization - Number of Visits  10    Progress Note Due on Visit  10    PT Start Time  1010    PT Stop Time  1100    PT Time Calculation (min)  50 min    Activity Tolerance  Patient tolerated treatment well;No increased pain    Behavior During Therapy  WFL for tasks assessed/performed       Past Medical History:  Diagnosis Date  . Abnormal Pap smear   . Anemia   . HPV (human papilloma virus) infection     Past Surgical History:  Procedure Laterality Date  . CHOLECYSTECTOMY    . CONDYLOMA EXCISION/FULGURATION    . TONSILLECTOMY      There were no vitals filed for this visit.  Pelvic Floor Physical Therapy Treatment Note  SCREENING  Changes in medications, allergies, or medical history?: none    SUBJECTIVE  Patient reports: She "did a thing" played volleyball and served and felt it pull. Is having pain in the L hip-flexors and sciatica feeling on the R hip/butt.  Precautions:  [redacted] weeks pregnant  Pain update:  Location of pain: L  hip, pubic symphysis (R posterior hip) Current pain:  7/10 (3/10) Max pain:  9/10 (5/10) Least pain:  6/10 (3/10) Nature of pain: crampy  **2/10 following treatment.  Patient Goals: To be able to walk for exercise for 30 min. Without pain.     OBJECTIVE  Changes in: Posture/Observations:  Hyperlordosis, L anterior/R posterior rotation.  Range of Motion/Flexibilty:   Strength/MMT:  LE MMT:   Pelvic Floor (From prior visit) [External Exam: Introitus Appears: WNL Skin integrity: WNL Palpation: TTP to L bulbo, B STP ans IC. Cough: parodoxical Prolapse visible?: N/A Scar mobility: not assessed Coordination: able to squeeze, release, and bulge.  Internal Vaginal Exam: Strength (PERF): 3/5, 3 seconds,1 time Symmetry: L>R for TTP Palpation: TTP throughout with greatest at anterior PR/PC B Prolapse: N/A]  Abdominal:   Palpation: TTP to L Iliacus, R Glute med, Piriformis, OI.  Gait Analysis:  INTERVENTIONS THIS SESSION:  Manual: Performed TP release to  Iliacus, R Glute med, Piriformis, OI followed by MET correction for L anterior/R posterior rotation and "plops" to re-align the pelvis and to decrease spasm and pain and allow for improved balance of musculature for improved function and decreased symptoms.  Total time: 60 min.                            PT  Short Term Goals - 09/08/19 1428      PT SHORT TERM GOAL #1   Title  Patient will demonstrate improved pelvic alignment and balance of musculature surrounding the pelvis to facilitate decreased PFM spasms and decrease pelvic and LB pain.    Baseline  R up-sip and anterior rotation Vs. LLE long    Time  3    Period  Weeks    Status  New    Target Date  09/29/19      PT SHORT TERM GOAL #2   Title  Patient will report a reduction in pain to no greater than 5/10 over the prior week to demonstrate symptom improvement.    Baseline  Max pain of 9/10    Time  3    Period  Weeks    Status   New    Target Date  09/29/19      PT SHORT TERM GOAL #3   Title  Patient will demonstrate HEP x1 in the clinic to demonstrate understanding and proper form to allow for further improvement.    Baseline  Pt. lacks knowledge of therapeutic exercises that will decrease her pain and Sx.    Time  3    Period  Weeks    Status  New    Target Date  09/29/19      PT SHORT TERM GOAL #4   Title  Patient will report consistent use of foot-stool (squatty-potty) for positioning with BM to decrease pain with BM and intra-abdominal pressure.    Baseline  Pt. having a BM every 3-4 days and having to strain. Bristol stool scale 1-2    Time  3    Period  Weeks    Status  New    Target Date  09/29/19        PT Long Term Goals - 09/08/19 1442      PT LONG TERM GOAL #1   Title  Patient will describe pain no greater than 2/10 during walking and exercising at light to moderate intensity for 20-30 min to demonstrate improved functional ability and allow for improved overall health.    Baseline  has increased pain with cat/cow, bridging, walking, sitting, standing, bending, etc. up to 9/10 in the morning and at night.    Time  10    Period  Weeks    Status  New    Target Date  11/17/19      PT LONG TERM GOAL #2   Title  Patient will score at or below 25% on the PGQ to demonstrate a clinically significant decrease in disability and improved functional ability.    Baseline  PGQ: 38/75 (51%)    Time  10    Period  Weeks    Status  New    Target Date  11/17/19      PT LONG TERM GOAL #3   Title  Patient will report no episodes of SUI over the course of the prior two weeks to demonstrate improved functional ability.    Baseline  Pt. having SUI with vomiting and jumping    Time  10    Period  Weeks    Status  New    Target Date  11/17/19      PT LONG TERM GOAL #4   Title  Patient will report having BM's at least every-other day with consistency between [facility] stool scale 3-5 over the prior week to  demonstrate decreased constipation.    Baseline  Pt.  having BM's every 3-4 days with bristol type 1-2 stool and straining    Time  10    Period  Weeks    Status  New    Target Date  11/17/19      PT LONG TERM GOAL #5   Title  Patient will report no pain with intercourse to demonstrate improved functional ability and allow for natural encouragement of labor when appropriate.    Baseline  Pt. having pain with deeper thrusting and ache following for "a little while"    Time  10    Period  Weeks    Status  New    Target Date  11/17/19            Plan - 01/03/20 1658    Clinical Impression Statement  Pt. Responded well to all interventions today, demonstrating improved pelvic alignment, decreased pain and spasm, as well as understanding and correct performance of all education and exercises provided today. They will continue to benefit from skilled physical therapy to work toward remaining goals and maximize function as well as decrease likelihood of symptom increase or recurrence.    PT Next Visit Plan  educate on standing posture and walking mechanics, deep-core strengthening. ? further release to L>R PFM, reassess external and posterior fourchette and ask about self release attempts.    PT Home Exercise Plan  seated posterior pelvic tilts, pillow positioning in bed, sit-to-stand mechanics, squats, SI belt, walking, pregnancy nutrition, TA in quad, seated adduction, WIC/dietician, self posterior fourchette and external PFM release    Consulted and Agree with Plan of Care  Patient       Patient will benefit from skilled therapeutic intervention in order to improve the following deficits and impairments:     Visit Diagnosis: No diagnosis found.     Problem List Patient Active Problem List   Diagnosis Date Noted  . Pica in adults 11/07/2019  . Normal labor 02/02/2018  . Anemia of pregnancy in third trimester 12/22/2017  . Heartburn in pregnancy in third trimester 12/22/2017  .  Nausea and vomiting in pregnancy 11/05/2017  . History of postpartum hemorrhage 09/17/2017  . 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 Fall from standing 11/23/2011  . Edema or excessive weight gain, antepartum 10/14/2011  . Anemia 10/09/2011  . Abnormal Pap smear and cervical HPV (human papillomavirus) 10/09/2011   Willa Rough DPT, ATC Willa Rough 01/03/2020, 4:58 PM  [place] MAIN [facility] SERVICES [address] [place], [place], [address] Phone: [phone]   Fax:  [phone]  Name: Kimberly Dunn MRN: [id number] Date of Birth: 1990/09/22

## 2020-01-03 ENCOUNTER — Other Ambulatory Visit: Payer: Self-pay

## 2020-01-03 ENCOUNTER — Ambulatory Visit: Payer: Medicaid Other

## 2020-01-03 DIAGNOSIS — M62838 Other muscle spasm: Secondary | ICD-10-CM | POA: Diagnosis not present

## 2020-01-03 DIAGNOSIS — R293 Abnormal posture: Secondary | ICD-10-CM | POA: Diagnosis not present

## 2020-01-03 DIAGNOSIS — M533 Sacrococcygeal disorders, not elsewhere classified: Secondary | ICD-10-CM

## 2020-01-03 NOTE — Therapy (Signed)
[place] [facility] MAIN [facility] SERVICES [address] [place], [place], [address] Phone: [phone]   Fax:  [phone]  Physical Therapy Treatment  The patient has been informed of current processes in place at Outpatient Rehab to protect patients from Covid-19 exposure including social distancing, schedule modifications, and new cleaning procedures. After discussing their particular risk with a therapist based on the patient's personal risk factors, the patient has decided to proceed with in-person therapy.   Patient Details  Name: Kimberly Dunn MRN: [id number] Date of Birth: 11-Jul-1991 No data recorded  Encounter Date: 01/03/2020  PT End of Session - 01/05/20 0838    Visit Number  10    Number of Visits  12    Date for PT Re-Evaluation  11/17/19    Authorization Type  medicaid    Authorization Time Period  4-15 through 5-12 4 visits    Authorization - Visit Number  8    Authorization - Number of Visits  10    Progress Note Due on Visit  10    PT Start Time  1605    PT Stop Time  1700    PT Time Calculation (min)  55 min    Activity Tolerance  Patient tolerated treatment well;No increased pain    Behavior During Therapy  WFL for tasks assessed/performed       Past Medical History:  Diagnosis Date  . Abnormal Pap smear   . Anemia   . HPV (human papilloma virus) infection     Past Surgical History:  Procedure Laterality Date  . CHOLECYSTECTOMY    . CONDYLOMA EXCISION/FULGURATION    . TONSILLECTOMY      There were no vitals filed for this visit.  Pelvic Floor Physical Therapy Treatment Note  SCREENING  Changes in medications, allergies, or medical history?: none    SUBJECTIVE  Patient reports: Her R hip feels a lot better but she is still feels pain in er L adductors near the pubic symphysis, wonders if she tore something.  Precautions:  [redacted] weeks pregnant  Pain update:  Location of pain: L hip, pubic symphysis (R  posterior hip) Current pain:  7/10 (3/10) Max pain:  9/10 (5/10) Least pain:  6/10 (3/10) Nature of pain: crampy  **no pain following session.  Patient Goals: To be able to walk for exercise for 30 min. Without pain.     OBJECTIVE  Changes in: Posture/Observations:  Hyperlordosis, Pt. Still getting up from lying straight forward.  Range of Motion/Flexibilty:  Decreased hip ER/ABD in supine.  Strength/MMT:  LE MMT:   Pelvic Floor (From prior visit) [External Exam: Introitus Appears: WNL Skin integrity: WNL Palpation: TTP to L bulbo, B STP ans IC. Cough: parodoxical Prolapse visible?: N/A Scar mobility: not assessed Coordination: able to squeeze, release, and bulge.  Internal Vaginal Exam: Strength (PERF): 3/5, 3 seconds,1 time Symmetry: L>R for TTP Palpation: TTP throughout with greatest at anterior PR/PC B Prolapse: N/A]  Abdominal:   Palpation:   TTP to L>R proximal adductors.  Gait Analysis:  INTERVENTIONS THIS SESSION:  Manual: performed TP release to B proximal adductors to decrease spasm and pain and allow for improved balance of musculature for improved function and decreased symptoms.  Therex: Educated on and practiced Supine butterfly stretch To maintain and improve muscle length and allow for improved balance of musculature for long-term symptom relief.  Self-care: Reviewed and practiced how to get in nd out of bed to protect her back an prevent worsening of  her diastasis recti.   Total time: 60 min.                               PT Short Term Goals - 09/08/19 1428      PT SHORT TERM GOAL #1   Title  Patient will demonstrate improved pelvic alignment and balance of musculature surrounding the pelvis to facilitate decreased PFM spasms and decrease pelvic and LB pain.    Baseline  R up-sip and anterior rotation Vs. LLE long    Time  3    Period  Weeks    Status  New    Target Date  09/29/19      PT SHORT TERM  GOAL #2   Title  Patient will report a reduction in pain to no greater than 5/10 over the prior week to demonstrate symptom improvement.    Baseline  Max pain of 9/10    Time  3    Period  Weeks    Status  New    Target Date  09/29/19      PT SHORT TERM GOAL #3   Title  Patient will demonstrate HEP x1 in the clinic to demonstrate understanding and proper form to allow for further improvement.    Baseline  Pt. lacks knowledge of therapeutic exercises that will decrease her pain and Sx.    Time  3    Period  Weeks    Status  New    Target Date  09/29/19      PT SHORT TERM GOAL #4   Title  Patient will report consistent use of foot-stool (squatty-potty) for positioning with BM to decrease pain with BM and intra-abdominal pressure.    Baseline  Pt. having a BM every 3-4 days and having to strain. Bristol stool scale 1-2    Time  3    Period  Weeks    Status  New    Target Date  09/29/19        PT Long Term Goals - 09/08/19 1442      PT LONG TERM GOAL #1   Title  Patient will describe pain no greater than 2/10 during walking and exercising at light to moderate intensity for 20-30 min to demonstrate improved functional ability and allow for improved overall health.    Baseline  has increased pain with cat/cow, bridging, walking, sitting, standing, bending, etc. up to 9/10 in the morning and at night.    Time  10    Period  Weeks    Status  New    Target Date  11/17/19      PT LONG TERM GOAL #2   Title  Patient will score at or below 25% on the PGQ to demonstrate a clinically significant decrease in disability and improved functional ability.    Baseline  PGQ: 38/75 (51%)    Time  10    Period  Weeks    Status  New    Target Date  11/17/19      PT LONG TERM GOAL #3   Title  Patient will report no episodes of SUI over the course of the prior two weeks to demonstrate improved functional ability.    Baseline  Pt. having SUI with vomiting and jumping    Time  10    Period  Weeks     Status  New    Target Date  11/17/19  PT LONG TERM GOAL #4   Title  Patient will report having BM's at least every-other day with consistency between [facility] stool scale 3-5 over the prior week to demonstrate decreased constipation.    Baseline  Pt. having BM's every 3-4 days with bristol type 1-2 stool and straining    Time  10    Period  Weeks    Status  New    Target Date  11/17/19      PT LONG TERM GOAL #5   Title  Patient will report no pain with intercourse to demonstrate improved functional ability and allow for natural encouragement of labor when appropriate.    Baseline  Pt. having pain with deeper thrusting and ache following for "a little while"    Time  10    Period  Weeks    Status  New    Target Date  11/17/19            Plan - 01/05/20 0839    Clinical Impression Statement  Pt. Responded well to all interventions today, demonstrating decreased spasm and pain from 7/10 to 0/10 as well as understanding and correct performance of all education and exercises provided today. They will continue to benefit from skilled physical therapy to work toward remaining goals and maximize function as well as decrease likelihood of symptom increase or recurrence.     PT Next Visit Plan  Review standing posture and walking mechanics, deep-core strengthening. ? further release to L>R PFM, reassess external and posterior fourchette and ask about self release attempts.    PT Home Exercise Plan  seated posterior pelvic tilts, pillow positioning in bed, sit-to-stand mechanics, squats, SI belt, walking, pregnancy nutrition, TA in quad, seated adduction, WIC/dietician, self posterior fourchette and external PFM release, adductor stretch, supine-to-long-sit    Consulted and Agree with Plan of Care  Patient       Patient will benefit from skilled therapeutic intervention in order to improve the following deficits and impairments:     Visit Diagnosis: Abnormal posture  Other muscle  spasm  Sacrococcygeal disorders, not elsewhere classified     Problem List Patient Active Problem List   Diagnosis Date Noted  . Pica in adults 11/07/2019  . Normal labor 02/02/2018  . Anemia of pregnancy in third trimester 12/22/2017  . Heartburn in pregnancy in third trimester 12/22/2017  . Nausea and vomiting in pregnancy 11/05/2017  . History of postpartum hemorrhage 09/17/2017  . 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 Fall from standing 11/23/2011  . Edema or excessive weight gain, antepartum 10/14/2011  . Anemia 10/09/2011  . Abnormal Pap smear and cervical HPV (human papillomavirus) 10/09/2011   Willa Rough DPT, ATC Willa Rough 01/05/2020, 8:41 AM  [place] MAIN [facility] SERVICES [address] [place], [place], [address] Phone: [phone]   Fax:  [phone]  Name: Kimberly Dunn MRN: [id number] Date of Birth: 1990-11-01

## 2020-01-03 NOTE — Patient Instructions (Signed)
Getting In/out of bed     Lying on back, bend left knee and place left arm across chest. Roll all in one movement to the right. Reverse to roll to the left. Always move as one unit.     Once you are lying on you side, move legs to edge of bed. Pull in the pelvic floor and lower tummy and push down with both hands while moving legs off bed to reach sitting position.  *Reverse sequence to return to lying down.  Copyright  VHI. All rights reserved.     Bring feet together and let the knees fall out to the sides. Hold for 5 deep breaths, rest then repeat 2 more times.

## 2020-01-07 ENCOUNTER — Ambulatory Visit (INDEPENDENT_AMBULATORY_CARE_PROVIDER_SITE_OTHER): Payer: Medicaid Other | Admitting: Obstetrics and Gynecology

## 2020-01-07 ENCOUNTER — Encounter: Payer: Self-pay | Admitting: Obstetrics and Gynecology

## 2020-01-07 ENCOUNTER — Other Ambulatory Visit: Payer: Self-pay

## 2020-01-07 VITALS — BP 99/64 | HR 87 | Wt 236.3 lb

## 2020-01-07 DIAGNOSIS — Z3483 Encounter for supervision of other normal pregnancy, third trimester: Secondary | ICD-10-CM

## 2020-01-07 DIAGNOSIS — Z3A37 37 weeks gestation of pregnancy: Secondary | ICD-10-CM

## 2020-01-07 DIAGNOSIS — O2243 Hemorrhoids in pregnancy, third trimester: Secondary | ICD-10-CM

## 2020-01-07 LAB — POCT URINALYSIS DIPSTICK OB
Bilirubin, UA: NEGATIVE
Blood, UA: NEGATIVE
Glucose, UA: NEGATIVE
Ketones, UA: NEGATIVE
Leukocytes, UA: NEGATIVE
Nitrite, UA: NEGATIVE
POC,PROTEIN,UA: NEGATIVE
Spec Grav, UA: 1.025 (ref 1.010–1.025)
Urobilinogen, UA: 0.2 E.U./dL
pH, UA: 6 (ref 5.0–8.0)

## 2020-01-07 NOTE — Progress Notes (Signed)
[name] present for routine prenatal care. Pt c/o of pressure when she walks.

## 2020-01-07 NOTE — Progress Notes (Signed)
ROB: Patient notes some pelvic pressure when walking. Notes she now has a hemorrhoid.  Discussed OTC measures for management. Discussed natural remedies for cervical ripening. RTC in 1 week.

## 2020-01-07 NOTE — Patient Instructions (Signed)

## 2020-01-13 ENCOUNTER — Ambulatory Visit: Payer: Medicaid Other

## 2020-01-18 ENCOUNTER — Encounter: Payer: Self-pay | Admitting: Obstetrics and Gynecology

## 2020-01-18 ENCOUNTER — Ambulatory Visit (INDEPENDENT_AMBULATORY_CARE_PROVIDER_SITE_OTHER): Payer: Medicaid Other | Admitting: Obstetrics and Gynecology

## 2020-01-18 ENCOUNTER — Other Ambulatory Visit: Payer: Self-pay

## 2020-01-18 VITALS — BP 103/72 | HR 88 | Wt 238.1 lb

## 2020-01-18 DIAGNOSIS — Z3483 Encounter for supervision of other normal pregnancy, third trimester: Secondary | ICD-10-CM

## 2020-01-18 DIAGNOSIS — Z3A39 39 weeks gestation of pregnancy: Secondary | ICD-10-CM

## 2020-01-18 LAB — POCT URINALYSIS DIPSTICK OB
Bilirubin, UA: NEGATIVE
Blood, UA: NEGATIVE
Glucose, UA: NEGATIVE
Ketones, UA: NEGATIVE
Leukocytes, UA: NEGATIVE
Nitrite, UA: NEGATIVE
Spec Grav, UA: 1.01
Urobilinogen, UA: 0.2 U/dL
pH, UA: 6

## 2020-01-18 NOTE — Progress Notes (Signed)
ROB: Patient denies contractions.  Has been trying some things to help with cervical ripening.  But so far no cervical change.  Induction for postdates briefly discussed.  Will order BPP with next visit.  Signs and symptoms of labor reviewed.

## 2020-01-18 NOTE — Addendum Note (Signed)
Addended by: Elonda Husky on: 01/18/2020 04:28 PM   Modules accepted: Orders

## 2020-01-18 NOTE — Lactation Note (Signed)
Lactation Consultation Note  Patient Name: Kimberly Dunn ICHTV'G Date: 01/18/2020   [name] states that she plans to bottle feed and declines information on breastfeeding and Ready Set Baby teaching.       Arlyss Gandy 01/18/2020, 4:23 PM

## 2020-01-19 ENCOUNTER — Other Ambulatory Visit: Payer: Self-pay | Admitting: Obstetrics and Gynecology

## 2020-01-19 ENCOUNTER — Telehealth: Payer: Self-pay | Admitting: Obstetrics and Gynecology

## 2020-01-19 DIAGNOSIS — Z3A39 39 weeks gestation of pregnancy: Secondary | ICD-10-CM

## 2020-01-19 NOTE — Addendum Note (Signed)
Addended by: Dorian Pod on: 01/19/2020 09:06 AM   Modules accepted: Orders

## 2020-01-19 NOTE — Telephone Encounter (Signed)
Can you please order a bpp u/s at [facility]. thank you

## 2020-01-19 NOTE — Telephone Encounter (Signed)
Order has been changed to [facility]. Please schedule.

## 2020-01-25 ENCOUNTER — Inpatient Hospital Stay
Admission: EM | Admit: 2020-01-25 | Discharge: 2020-01-27 | DRG: 807 | Disposition: A | Discharge status: Home / Self Care | Payer: Medicaid Other | Attending: Obstetrics and Gynecology | Admitting: Obstetrics and Gynecology

## 2020-01-25 ENCOUNTER — Observation Stay (HOSPITAL_BASED_OUTPATIENT_CLINIC_OR_DEPARTMENT_OTHER)
Admission: EM | Admit: 2020-01-25 | Discharge: 2020-01-25 | Disposition: A | Discharge status: Home / Self Care | Payer: Medicaid Other | Source: Home / Self Care | Admitting: Obstetrics and Gynecology

## 2020-01-25 ENCOUNTER — Inpatient Hospital Stay: Admit: 2020-01-25 | Payer: Self-pay

## 2020-01-25 ENCOUNTER — Other Ambulatory Visit: Payer: Self-pay

## 2020-01-25 ENCOUNTER — Encounter: Payer: Self-pay | Admitting: Obstetrics and Gynecology

## 2020-01-25 ENCOUNTER — Ambulatory Visit
Admission: RE | Admit: 2020-01-25 | Discharge: 2020-01-25 | Disposition: A | Discharge status: Home / Self Care | Payer: Medicaid Other | Source: Ambulatory Visit | Attending: Obstetrics and Gynecology | Admitting: Obstetrics and Gynecology

## 2020-01-25 DIAGNOSIS — D509 Iron deficiency anemia, unspecified: Secondary | ICD-10-CM | POA: Diagnosis present

## 2020-01-25 DIAGNOSIS — K219 Gastro-esophageal reflux disease without esophagitis: Secondary | ICD-10-CM | POA: Diagnosis present

## 2020-01-25 DIAGNOSIS — F5083 Pica in adults: Secondary | ICD-10-CM

## 2020-01-25 DIAGNOSIS — O99013 Anemia complicating pregnancy, third trimester: Secondary | ICD-10-CM | POA: Diagnosis not present

## 2020-01-25 DIAGNOSIS — Z3490 Encounter for supervision of normal pregnancy, unspecified, unspecified trimester: Secondary | ICD-10-CM

## 2020-01-25 DIAGNOSIS — Z3A40 40 weeks gestation of pregnancy: Secondary | ICD-10-CM

## 2020-01-25 DIAGNOSIS — F5089 Other specified eating disorder: Secondary | ICD-10-CM

## 2020-01-25 DIAGNOSIS — Z3A39 39 weeks gestation of pregnancy: Secondary | ICD-10-CM

## 2020-01-25 DIAGNOSIS — O9962 Diseases of the digestive system complicating childbirth: Secondary | ICD-10-CM | POA: Diagnosis present

## 2020-01-25 DIAGNOSIS — Z3A00 Weeks of gestation of pregnancy not specified: Secondary | ICD-10-CM | POA: Diagnosis not present

## 2020-01-25 DIAGNOSIS — Z8759 Personal history of other complications of pregnancy, childbirth and the puerperium: Secondary | ICD-10-CM | POA: Diagnosis not present

## 2020-01-25 DIAGNOSIS — Z20822 Contact with and (suspected) exposure to covid-19: Secondary | ICD-10-CM | POA: Diagnosis present

## 2020-01-25 DIAGNOSIS — O9902 Anemia complicating childbirth: Secondary | ICD-10-CM | POA: Diagnosis present

## 2020-01-25 DIAGNOSIS — O26893 Other specified pregnancy related conditions, third trimester: Secondary | ICD-10-CM | POA: Diagnosis present

## 2020-01-25 DIAGNOSIS — O479 False labor, unspecified: Secondary | ICD-10-CM

## 2020-01-25 DIAGNOSIS — Z8619 Personal history of other infectious and parasitic diseases: Secondary | ICD-10-CM | POA: Diagnosis present

## 2020-01-25 DIAGNOSIS — O480 Post-term pregnancy: Secondary | ICD-10-CM | POA: Insufficient documentation

## 2020-01-25 LAB — CBC
HCT: 33.6 % — ABNORMAL LOW (ref 36.0–46.0)
Hemoglobin: 11.2 g/dL — ABNORMAL LOW (ref 12.0–15.0)
MCH: 27.2 pg (ref 26.0–34.0)
MCHC: 33.3 g/dL (ref 30.0–36.0)
MCV: 81.6 fL (ref 80.0–100.0)
Platelets: 246 10*3/uL (ref 150–400)
RBC: 4.12 MIL/uL (ref 3.87–5.11)
RDW: 15.8 % — ABNORMAL HIGH (ref 11.5–15.5)
WBC: 11.2 10*3/uL — ABNORMAL HIGH (ref 4.0–10.5)
nRBC: 0 % (ref 0.0–0.2)

## 2020-01-25 LAB — TYPE AND SCREEN
ABO/RH(D): A POS
Antibody Screen: NEGATIVE

## 2020-01-25 LAB — SARS CORONAVIRUS 2 BY RT PCR (HOSPITAL ORDER, PERFORMED IN ~~LOC~~ HOSPITAL LAB): SARS Coronavirus 2: NEGATIVE

## 2020-01-25 MED ORDER — LACTATED RINGERS IV SOLN
125.0000 mL/h | INTRAVENOUS | Status: DC
  Administered 2020-01-25: 125 mL/h via INTRAVENOUS

## 2020-01-25 MED ORDER — LACTATED RINGERS IV SOLN: 500.0000 mL | INTRAVENOUS | Status: DC | PRN

## 2020-01-25 MED ORDER — SIMETHICONE 80 MG PO CHEW: 80.0000 mg | CHEWABLE_TABLET | ORAL | Status: DC | PRN

## 2020-01-25 MED ORDER — OXYTOCIN 40 UNITS IN NORMAL SALINE INFUSION - SIMPLE MED: 2.5000 [IU]/h | INTRAVENOUS | Status: DC

## 2020-01-25 MED ORDER — LACTATED RINGERS IV BOLUS
1000.0000 mL | Freq: Once | INTRAVENOUS | Status: AC
  Administered 2020-01-25: 1000 mL via INTRAVENOUS

## 2020-01-25 MED ORDER — SENNOSIDES-DOCUSATE SODIUM 8.6-50 MG PO TABS
2.0000 | ORAL_TABLET | ORAL | Status: DC
  Administered 2020-01-26 – 2020-01-27 (×2): 2 via ORAL
  Filled 2020-01-25 (×2): qty 2

## 2020-01-25 MED ORDER — OXYTOCIN 10 UNIT/ML IJ SOLN
INTRAMUSCULAR | Status: AC
  Filled 2020-01-25: qty 2

## 2020-01-25 MED ORDER — IBUPROFEN 800 MG PO TABS
800.0000 mg | ORAL_TABLET | Freq: Four times a day (QID) | ORAL | Status: DC
  Administered 2020-01-25 – 2020-01-27 (×4): 800 mg via ORAL
  Filled 2020-01-25 (×4): qty 1

## 2020-01-25 MED ORDER — BENZOCAINE-MENTHOL 20-0.5 % EX AERO
1.0000 "application " | INHALATION_SPRAY | CUTANEOUS | Status: DC | PRN
  Administered 2020-01-25: 1 via TOPICAL
  Filled 2020-01-25: qty 56

## 2020-01-25 MED ORDER — LIDOCAINE HCL (PF) 1 % IJ SOLN: 30.0000 mL | INTRAMUSCULAR | Status: DC | PRN

## 2020-01-25 MED ORDER — ACETAMINOPHEN 325 MG PO TABS
650.0000 mg | ORAL_TABLET | ORAL | Status: DC | PRN
  Administered 2020-01-25 – 2020-01-26 (×5): 650 mg via ORAL
  Filled 2020-01-25 (×4): qty 2

## 2020-01-25 MED ORDER — LIDOCAINE HCL (PF) 1 % IJ SOLN
INTRAMUSCULAR | Status: AC
  Filled 2020-01-25: qty 30

## 2020-01-25 MED ORDER — SOD CITRATE-CITRIC ACID 500-334 MG/5ML PO SOLN: 30.0000 mL | ORAL | Status: DC | PRN

## 2020-01-25 MED ORDER — OXYCODONE-ACETAMINOPHEN 5-325 MG PO TABS: 2.0000 | ORAL_TABLET | ORAL | Status: DC | PRN

## 2020-01-25 MED ORDER — ONDANSETRON HCL 4 MG/2ML IJ SOLN: 4.0000 mg | Freq: Four times a day (QID) | INTRAMUSCULAR | Status: DC | PRN

## 2020-01-25 MED ORDER — AMMONIA AROMATIC IN INHA
RESPIRATORY_TRACT | Status: AC
  Filled 2020-01-25: qty 10

## 2020-01-25 MED ORDER — PANTOPRAZOLE SODIUM 40 MG PO PACK
20.0000 mg | PACK | Freq: Every day | ORAL | Status: DC
  Filled 2020-01-25 (×2): qty 20

## 2020-01-25 MED ORDER — OXYCODONE-ACETAMINOPHEN 5-325 MG PO TABS: 1.0000 | ORAL_TABLET | ORAL | Status: DC | PRN

## 2020-01-25 MED ORDER — BUTORPHANOL TARTRATE 1 MG/ML IJ SOLN
1.0000 mg | INTRAMUSCULAR | Status: DC | PRN
  Administered 2020-01-25: 1 mg via INTRAVENOUS
  Filled 2020-01-25: qty 1

## 2020-01-25 MED ORDER — OXYTOCIN BOLUS FROM INFUSION: 500.0000 mL | Freq: Once | INTRAVENOUS | Status: DC

## 2020-01-25 MED ORDER — ACETAMINOPHEN 325 MG PO TABS: 650.0000 mg | ORAL_TABLET | ORAL | Status: DC | PRN

## 2020-01-25 MED ORDER — MISOPROSTOL 200 MCG PO TABS
ORAL_TABLET | ORAL | Status: AC
  Filled 2020-01-25: qty 4

## 2020-01-25 MED ORDER — ZOLPIDEM TARTRATE 5 MG PO TABS: 5.0000 mg | ORAL_TABLET | Freq: Every evening | ORAL | Status: DC | PRN

## 2020-01-25 MED ORDER — ESOMEPRAZOLE MAGNESIUM 20 MG PO PACK: 20.0000 mg | PACK | Freq: Every day | ORAL | Status: DC

## 2020-01-25 MED ORDER — PRENATAL MULTIVITAMIN CH
1.0000 | ORAL_TABLET | Freq: Every day | ORAL | Status: DC
  Administered 2020-01-26 – 2020-01-27 (×2): 1 via ORAL
  Filled 2020-01-25 (×2): qty 1

## 2020-01-25 MED ORDER — ONDANSETRON HCL 4 MG PO TABS: 4.0000 mg | ORAL_TABLET | ORAL | Status: DC | PRN

## 2020-01-25 MED ORDER — DIBUCAINE (PERIANAL) 1 % EX OINT: 1.0000 "application " | TOPICAL_OINTMENT | CUTANEOUS | Status: DC | PRN

## 2020-01-25 MED ORDER — DIPHENHYDRAMINE HCL 25 MG PO CAPS: 25.0000 mg | ORAL_CAPSULE | Freq: Four times a day (QID) | ORAL | Status: DC | PRN

## 2020-01-25 MED ORDER — COCONUT OIL OIL
1.0000 "application " | TOPICAL_OIL | Status: DC | PRN
  Administered 2020-01-25: 1 via TOPICAL
  Filled 2020-01-25: qty 120

## 2020-01-25 MED ORDER — ONDANSETRON HCL 4 MG/2ML IJ SOLN: 4.0000 mg | INTRAMUSCULAR | Status: DC | PRN

## 2020-01-25 MED ORDER — OXYTOCIN 40 UNITS IN NORMAL SALINE INFUSION - SIMPLE MED
INTRAVENOUS | Status: AC
  Filled 2020-01-25: qty 1000

## 2020-01-25 MED ORDER — WITCH HAZEL-GLYCERIN EX PADS: 1.0000 "application " | MEDICATED_PAD | CUTANEOUS | Status: DC | PRN

## 2020-01-25 NOTE — OB Triage Note (Signed)
Pt. Presented in triage with c/o of CTX q 8-10 min.  They began "yesterday at 0500 and have gotten more uncomfortable".  + FM, no VB, no LOF.

## 2020-01-25 NOTE — OB Triage Note (Signed)
Communication with patient for delay in D/C.  Waiting for provider to put them in the system.

## 2020-01-25 NOTE — OB Triage Note (Signed)
Pt presents to the ED c/o ctx. Pt is a [id number] [redacted]w[redacted]d. Pt was discharged home earlier this morning after experiencing ctx and pt is returning to the ED reporting more regular and stronger ctx. Pt denies leaking of fluid or vaginal bleeding and states positive fetal movement. Pt reports losing her mucus plug at 1100 this morning and reports pain 8/10 in abdomen and back. External monitors applied and assessing. VS. SVE performed 7/80/-3 performed by J.Lunsford, RN

## 2020-01-25 NOTE — H&P (Signed)
Obstetric History and Physical  Kimberly Dunn is a 29 y.o. A6T0160 with IUP at [redacted]w[redacted]d presenting for complaints of contractions, since yesterday.  She was seen in triage overnight for labor evaluation with contractions every 4-5 minutes, but no cervical change was noted and contractions spaced out to every 5-8 minutes. Patient states she has been having  regular, every 3-4 minutes contractions, no vaginal bleeding, intact membranes, with active fetal movement.    Prenatal Course Source of Care: [facility] with onset of care at 13 weeks Pregnancy complications or risks: Patient Active Problem List   Diagnosis Date Noted  . Pregnancy 01/25/2020  . Indication for care in labor or delivery 01/25/2020  . Pica in adults 11/07/2019  . Normal labor 02/02/2018  . Anemia of pregnancy in third trimester 12/22/2017  . Heartburn in pregnancy in third trimester 12/22/2017  . History of postpartum hemorrhage 09/17/2017  . 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 Fall from standing 11/23/2011  . Edema or excessive weight gain, antepartum 10/14/2011  . Anemia 10/09/2011  . Abnormal Pap smear and cervical HPV (human papillomavirus) 10/09/2011   She plans to breastfeed She desires IUD for postpartum contraception.   Prenatal labs and studies: ABO, Rh: --/--/PENDING (05/18 1616) Antibody: PENDING (05/18 1616) Rubella: 2.73 (10/19 1455) RPR: Reactive (02/24 0850)  HBsAg: Negative (10/19 1455)  HIV: Non Reactive (10/19 1455)  FUX:NATFTDDU/-- (04/20 1605) 1 hr Glucola  normal Genetic screening normal Anatomy US normal    OB History  Gravida Para Term Preterm AB Living  4 2 2  0 1 2  SAB TAB Ectopic Multiple Live Births  1 0 0 0 2    # Outcome Date GA Lbr Len/2nd Weight Sex Delivery Anes PTL Lv  4 Current           3 Term 02/02/18 [redacted]w[redacted]d  3600 g F Vag-Spont None  LIV     Birth Comments: N/A  2 SAB 2018          1 Term 12/17/11 [redacted]w[redacted]d / 00:25 3402 g F Vag-Spont None  LIV     Past Medical History:  Diagnosis Date  . Abnormal Pap smear   . Anemia   . HPV (human papilloma virus) infection     Past Surgical History:  Procedure Laterality Date  . CHOLECYSTECTOMY    . CONDYLOMA EXCISION/FULGURATION    . TONSILLECTOMY      Social History   Socioeconomic History  . Marital status: Single    Spouse name: Luis  . Number of children: 2  . Years of education: 78  . Highest education level: GED or equivalent  Occupational History  . Occupation: Unemployed  Tobacco Use  . Smoking status: Never Smoker  . Smokeless tobacco: Never Used  Substance and Sexual Activity  . Alcohol use: No  . Drug use: No  . Sexual activity: Yes    Birth control/protection: None    Comment: desires Implanon  Other Topics Concern  . Not on file  Social History Narrative  . Not on file   Social Determinants of Health   Financial Resource Strain:   . Difficulty of Paying Living Expenses:   Food Insecurity:   . Worried About Charity fundraiser in the Last Year:   . Arboriculturist in the Last Year:   Transportation Needs:   . Film/video editor (Medical):   Marland Kitchen Lack of Transportation (Non-Medical):   Physical Activity:   . Days of Exercise per Week:   .  Minutes of Exercise per Session:   Stress:   . Feeling of Stress :   Social Connections:   . Frequency of Communication with Friends and Family:   . Frequency of Social Gatherings with Friends and Family:   . Attends Religious Services:   . Active Member of Clubs or Organizations:   . Attends Banker Meetings:   Marland Kitchen Marital Status:     Family History  Problem Relation Age of Onset  . Diabetes Mother   . Hypertension Father   . Hypertension Sister   . Anemia Sister   . Depression Sister   . Anesthesia problems Neg Hx     Medications Prior to Admission  Medication Sig Dispense Refill Last Dose  . pantoprazole (PROTONIX) 20 MG tablet Take 1 tablet (20 mg total) by mouth 2 (two) times daily  before a meal. 60 tablet 3 01/24/2020 at Unknown time  . Prenatal Vit-Fe Fumarate-FA (PRENATAL MULTIVITAMIN) TABS tablet Take 1 tablet by mouth daily at 12 noon.   01/24/2020 at Unknown time  . acetaminophen (TYLENOL) 325 MG tablet Take 650 mg by mouth every 6 (six) hours as needed.     . docusate sodium (COLACE) 100 MG capsule Take 1 capsule (100 mg total) by mouth 2 (two) times daily as needed. 30 capsule 2   . Doxylamine-Pyridoxine (DICLEGIS) 10-10 MG TBEC Take 2 tablets by mouth at bedtime. If symptoms persist, add one tablet in the morning and one in the afternoon (Patient not taking: Reported on 01/25/2020) 100 tablet 5   . esomeprazole (NEXIUM) 20 MG packet Take 20 mg by mouth daily before breakfast. (Patient not taking: Reported on 01/25/2020) 30 each 2   . Fe Cbn-Fe Gluc-FA-B12-C-DSS (FERRALET 90) 90-1 MG TABS Take 1 tablet by mouth daily. 30 tablet 3   . ferrous sulfate 325 (65 FE) MG tablet Take 325 mg by mouth daily with breakfast.       No Known Allergies  Review of Systems: Negative except for what is mentioned in HPI.  Physical Exam: BP 128/77 (BP Location: Left Arm)   Pulse 96   Temp 98.7 F (37.1 C) (Oral)   Resp 18   Ht 5\' 8"  (1.727 m)   Wt 108 kg   LMP  (LMP Unknown)   BMI 36.20 kg/m  CONSTITUTIONAL: Well-developed, well-nourished female in no acute distress.  HENT:  Normocephalic, atraumatic, External right and left ear normal. Oropharynx is clear and moist EYES: Conjunctivae and EOM are normal. Pupils are equal, round, and reactive to light. No scleral icterus.  NECK: Normal range of motion, supple, no masses SKIN: Skin is warm and dry. No rash noted. Not diaphoretic. No erythema. No pallor. NEUROLOGIC: Alert and oriented to person, place, and time. Normal reflexes, muscle tone coordination. No cranial nerve deficit noted. PSYCHIATRIC: Normal mood and affect. Normal behavior. Normal judgment and thought content. CARDIOVASCULAR: Normal heart rate noted, regular  rhythm RESPIRATORY: Effort and breath sounds normal, no problems with respiration noted ABDOMEN: Soft, nontender, nondistended, gravid. MUSCULOSKELETAL: Normal range of motion. No edema and no tenderness. 2+ distal pulses.  Cervical Exam: Dilatation 7cm   Effacement 80%   Station -3   Presentation: cephalic FHT:  Baseline rate 145 bpm   Variability moderate  Accelerations present   Decelerations none Contractions: Every 3-4 mins   Pertinent Labs/Studies:   Results for orders placed or performed during the hospital encounter of 01/25/20 (from the past 24 hour(s))  CBC     Status: Abnormal  Collection Time: 01/25/20  4:14 PM  Result Value Ref Range   WBC 11.2 (H) 4.0 - 10.5 K/uL   RBC 4.12 3.87 - 5.11 MIL/uL   Hemoglobin 11.2 (L) 12.0 - 15.0 g/dL   HCT 82.9 (L) 56.2 - 13.0 %   MCV 81.6 80.0 - 100.0 fL   MCH 27.2 26.0 - 34.0 pg   MCHC 33.3 30.0 - 36.0 g/dL   RDW 86.5 (H) 78.4 - 69.6 %   Platelets 246 150 - 400 K/uL   nRBC 0.0 0.0 - 0.2 %  Type and screen [facility]     Status: None (Preliminary result)   Collection Time: 01/25/20  4:16 PM  Result Value Ref Range   ABO/RH(D) PENDING    Antibody Screen PENDING    Sample Expiration      01/28/2020,2359 Performed at [facility], [address], [place], [place] [address]     Assessment : Kimberly Dunn is a 29 y.o. [id number] at [redacted]w[redacted]d being admitted for labor.  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 Augmentation as ordered as per protocol. Analgesia as needed. FWB: Reassuring fetal heart tracing.  GBS negative Delivery plan: Hopeful for vaginal delivery    Hildred Laser, MD [facility]

## 2020-01-25 NOTE — Final Progress Note (Signed)
L&D OB Triage Note  Kimberly Dunn is a 29 y.o. [id number] female at [redacted]w[redacted]d, EDD Estimated Date of Delivery: 01/25/20 who presented to triage for complaints of contractions.  She was evaluated by the nurses with no significant findings for labor. Vital signs stable. An NST was performed and has been reviewed by MD. She was treated with PO fluids.   NST INTERPRETATION: Indications: rule out uterine contractions  Mode: External Baseline Rate (A): 135 bpm Variability: Moderate Accelerations: 15 x 15 Decelerations: None     Contraction Frequency (min): 6-10  Impression: reactive   Plan: NST performed was reviewed and was found to be reactive. She was discharged home with bleeding/labor precautions.  Continue routine prenatal care. Follow up with OB/GYN as previously scheduled.     Hildred Laser, MD  [facility]

## 2020-01-25 NOTE — OB Triage Note (Signed)
Reviewed D/C instructions and labor precautions with pt.   Pt. Will f/u with scheduled U/S today.

## 2020-01-26 LAB — RPR
RPR Ser Ql: REACTIVE — AB
RPR Titer: 1:8 {titer}

## 2020-01-26 MED ORDER — DM-GUAIFENESIN ER 30-600 MG PO TB12
1.0000 | ORAL_TABLET | Freq: Once | ORAL | Status: AC
  Administered 2020-01-26: 1 via ORAL
  Filled 2020-01-26: qty 1

## 2020-01-26 MED ORDER — MENTHOL 3 MG MT LOZG
1.0000 | LOZENGE | OROMUCOSAL | Status: DC | PRN
  Administered 2020-01-26: 3 mg via ORAL
  Filled 2020-01-26: qty 9

## 2020-01-26 NOTE — Progress Notes (Signed)
Pt called out while on the toilet and stated she thought she passed a clot. Pt passed 1 baseball-sized clot. No continued constant bleeding/trickle. Peri pad changed and pt helped back to bed. Fundus firm and U/2 at this time. Reassured pt and explained to call out for any obvious changes in bleeding or clots.

## 2020-01-26 NOTE — Progress Notes (Addendum)
Post Partum Day # 1, s/p SVD  Subjective: no complaints, up ad lib, voiding and tolerating PO  Objective: Temp:  [98 F (36.7 C)-98.9 F (37.2 C)] 98.5 F (36.9 C) (05/19 0727) Pulse Rate:  [70-96] 77 (05/19 0727) Resp:  [16-20] 20 (05/19 0727) BP: (104-130)/(63-92) 111/67 (05/19 0727) SpO2:  [99 %-100 %] 100 % (05/19 0727) Weight:  [199 kg] 108 kg (05/18 1524)  Physical Exam:  General: alert and no distress  Lungs: clear to auscultation bilaterally Breasts: normal appearance, no masses or tenderness Heart: regular rate and rhythm, S1, S2 normal, no murmur, click, rub or gallop Abdomen: soft, non-tender; bowel sounds normal; no masses,  no organomegaly Pelvis: Lochia: appropriate, Uterine Fundus: firm Extremities: DVT Evaluation: No evidence of DVT seen on physical exam. Negative Homan's sign. No cords or calf tenderness. No significant calf/ankle edema.  Recent Labs    01/25/20 1614  HGB 11.2*  HCT 33.6*    Assessment/Plan: Doing well postpartum.  Breastfeeding and formula feeding, Lactation consult as needed Circumcision declined  Contraception IUD Plan for discharge tomorrow   LOS: 1 day   Hildred Laser, MD [facility] 01/26/2020 8:07 AM

## 2020-01-27 LAB — T.PALLIDUM AB, TOTAL: T Pallidum Abs: REACTIVE — AB

## 2020-01-27 MED ORDER — IBUPROFEN 800 MG PO TABS
800.0000 mg | ORAL_TABLET | Freq: Four times a day (QID) | ORAL | Status: DC
  Administered 2020-01-27: 800 mg via ORAL
  Filled 2020-01-27: qty 1

## 2020-01-27 MED ORDER — IBUPROFEN 800 MG PO TABS
800.0000 mg | ORAL_TABLET | Freq: Three times a day (TID) | ORAL | 1 refills | Status: DC | PRN
Start: 2020-01-27 — End: 2020-03-08

## 2020-01-27 NOTE — Progress Notes (Signed)
Pt discharged with infant. Discharge instructions, prescriptions, and follow up appointments given to and reviewed with patient. Pt verbalized understanding. Escorted out by auxillary.  

## 2020-01-27 NOTE — Discharge Summary (Signed)
Postpartum Discharge Summary     Patient Name: Kimberly Dunn DOB: 21-Sep-1990 MRN: [id number]  Date of admission: 01/25/2020 Delivery date:01/25/2020  Delivering provider: Rubie Maid  Date of discharge: 01/27/2020  Admitting diagnosis: Indication for care in labor or delivery [O75.9] Intrauterine pregnancy: [redacted]w[redacted]d    Secondary diagnosis:  Active Problems:   History of postpartum hemorrhage   History of latent syphilis   Anemia of pregnancy in third trimester   Heartburn in pregnancy in third trimester   Additional problems: None   Discharge diagnosis: Term Pregnancy Delivered                                              Post partum procedures:None Augmentation: None Complications: None  Hospital course: Onset of Labor With Vaginal Delivery      29y.o. yo [id number] admitted in Active Labor on 01/25/2020. Patient had an uncomplicated labor course as follows:  Membrane Rupture Time/Date: 7:08 PM ,01/25/2020   Delivery Method:Vaginal, Spontaneous  Episiotomy: None  Lacerations:    Patient had an uncomplicated postpartum course.  She is ambulating, tolerating a regular diet, passing flatus, and urinating well. Patient is discharged home in stable condition on 01/27/20.  Newborn Data: Birth date:01/25/2020  Birth time:7:17 PM  Gender:Female  Living status:Living  Apgars:9 ,9  Weight:4110 g   Magnesium Sulfate received: No BMZ received: No Rhophylac:No MMR:No T-DaP:Given prenatally Flu: Given prenatally Transfusion:No  Physical exam  Vitals:   01/26/20 0727 01/26/20 1603 01/26/20 2311 01/27/20 0808  BP: 111/67 118/65 99/78 105/72  Pulse: 77 80 80 80  Resp: '20 18 18 18  '$ Temp: 98.5 F (36.9 C) 98.7 F (37.1 C) 98 F (36.7 C) 97.9 F (36.6 C)  TempSrc:   Oral Oral  SpO2: 100% 99% 92% 100%  Weight:      Height:       General: alert, cooperative and no distress Lochia: appropriate Uterine Fundus: firm Incision: N/A DVT Evaluation: No  evidence of DVT seen on physical exam. Negative Homan's sign. No cords or calf tenderness. No significant calf/ankle edema. Labs: Lab Results  Component Value Date   WBC 11.2 (H) 01/25/2020   HGB 11.2 (L) 01/25/2020   HCT 33.6 (L) 01/25/2020   MCV 81.6 01/25/2020   PLT 246 01/25/2020   CMP Latest Ref Rng & Units 05/31/2019  Glucose 70 - 99 mg/dL 101(H)  BUN 6 - 20 mg/dL 10  Creatinine 0.44 - 1.00 mg/dL 0.76  Sodium 135 - 145 mmol/L 137  Potassium 3.5 - 5.1 mmol/L 3.7  Chloride 98 - 111 mmol/L 103  CO2 22 - 32 mmol/L 26  Calcium 8.9 - 10.3 mg/dL 9.3  Total Protein 6.5 - 8.1 g/dL 7.0  Total Bilirubin 0.3 - 1.2 mg/dL 0.6  Alkaline Phos 38 - 126 U/L 44  AST 15 - 41 U/L 12(L)  ALT 0 - 44 U/L 8   Edinburgh Score: Edinburgh Postnatal Depression Scale Screening Tool 01/26/2020  I have been able to laugh and see the funny side of things. 0  I have looked forward with enjoyment to things. 0  I have blamed myself unnecessarily when things went wrong. 2  I have been anxious or worried for no good reason. 1  I have felt scared or panicky for no good reason. 0  Things have been getting on top of  me. 0  I have been so unhappy that I have had difficulty sleeping. 0  I have felt sad or miserable. 0  I have been so unhappy that I have been crying. 0  The thought of harming myself has occurred to me. 0  Edinburgh Postnatal Depression Scale Total 3      After visit meds:  Allergies as of 01/27/2020   No Known Allergies     Medication List    STOP taking these medications   acetaminophen 325 MG tablet Commonly known as: TYLENOL   Doxylamine-Pyridoxine 10-10 MG Tbec Commonly known as: Diclegis   Ferralet 90 90-1 MG Tabs   pantoprazole 20 MG tablet Commonly known as: Protonix     TAKE these medications   docusate sodium 100 MG capsule Commonly known as: COLACE Take 1 capsule (100 mg total) by mouth 2 (two) times daily as needed.   esomeprazole 20 MG packet Commonly known  as: NexIUM Take 20 mg by mouth daily before breakfast.   ferrous sulfate 325 (65 FE) MG tablet Take 325 mg by mouth daily with breakfast.   ibuprofen 800 MG tablet Commonly known as: ADVIL Take 1 tablet (800 mg total) by mouth every 8 (eight) hours as needed.   prenatal multivitamin Tabs tablet Take 1 tablet by mouth daily at 12 noon.        Discharge home in stable condition Infant Feeding: Bottle and Breast Infant Disposition:home with mother Discharge instruction: per After Visit Summary and Postpartum booklet. Activity: Advance as tolerated. Pelvic rest for 6 weeks.  Diet: routine diet Anticipated Birth Control: Nexplanon Postpartum Appointment:6 weeks Additional Postpartum F/U: N/A Future Appointments: Future Appointments  Date Time Provider [place]  02/03/2020  2:30 PM Willa Rough, PT [facility] None   Follow up Visit: Follow-up Information    Rubie Maid, MD Follow up.   Specialties: Obstetrics and Gynecology, Radiology Why: 6 weeks for postpartum visit and Nexplanon insertion Contact information: [address] [address] [phone]               01/27/2020 Rubie Maid, MD

## 2020-01-27 NOTE — Progress Notes (Signed)
Pt would like to use a hand pump; RN brought pump kit to room and set the hand pump; RN showed pt how to use hand pump

## 2020-02-03 ENCOUNTER — Ambulatory Visit: Payer: Medicaid Other | Attending: Obstetrics and Gynecology

## 2020-03-08 ENCOUNTER — Ambulatory Visit (INDEPENDENT_AMBULATORY_CARE_PROVIDER_SITE_OTHER): Payer: Medicaid Other | Admitting: Obstetrics and Gynecology

## 2020-03-08 ENCOUNTER — Other Ambulatory Visit: Payer: Self-pay

## 2020-03-08 ENCOUNTER — Encounter: Payer: Self-pay | Admitting: Obstetrics and Gynecology

## 2020-03-08 DIAGNOSIS — Z30017 Encounter for initial prescription of implantable subdermal contraceptive: Secondary | ICD-10-CM

## 2020-03-08 LAB — POCT URINE PREGNANCY: Preg Test, Ur: NEGATIVE

## 2020-03-08 NOTE — Progress Notes (Signed)
PT is present today for her postpartum visit. Pt stated that she is breastfeeding and have had sexually intercourse recently w/o any protection. Pt stated that she would like to get the Nexplanon for birth control. EPDS=3.  Pt stated that she is doing well no complaints.

## 2020-03-08 NOTE — Progress Notes (Addendum)
OBSTETRICS POSTPARTUM CLINIC PROGRESS NOTE  Subjective:     Kimberly Dunn is a 29 y.o. [phone] female who presents for a postpartum visit. She is 6 weeks postpartum following a spontaneous vaginal delivery. I have fully reviewed the prenatal and intrapartum course. The delivery was at 40 gestational weeks.  Anesthesia: none. Postpartum course has been well. Baby's course has been well. Baby is feeding by both breast and bottle - Kimberly Dunn Start. Bleeding: patient has not resumed menses, with No LMP recorded. Bowel function is normal. Bladder function is normal. Patient is sexually active. Has been using condoms.  Contraception method desired is Nexplanon. Postpartum depression screening: negative (EPDS = 3).  The following portions of the patient's history were reviewed and updated as appropriate: allergies, current medications, past family history, past medical history, past social history, past surgical history and problem list.  Review of Systems Pertinent items noted in HPI and remainder of comprehensive ROS otherwise negative.   Objective:    BP 94/62   Pulse 62   Ht 5\' 8"  (1.727 m)   Wt 216 lb 6.4 oz (98.2 kg)   Breastfeeding Yes   BMI 32.90 kg/m   General:  alert and no distress   Breasts:  inspection negative, no nipple discharge or bleeding, no masses or nodularity palpable  Lungs: clear to auscultation bilaterally  Heart:  regular rate and rhythm, S1, S2 normal, no murmur, click, rub or gallop  Abdomen: soft, non-tender; bowel sounds normal; no masses,  no organomegaly.     Vulva:  normal  Vagina: normal vagina, no discharge, exudate, lesion, or erythema  Cervix:  no cervical motion tenderness and no lesions  Corpus: normal size, contour, position, consistency, mobility, non-tender  Adnexa:  normal adnexa and no mass, fullness, tenderness  Rectal Exam: Not performed.         Labs:  Lab Results  Component Value Date   HGB 11.2 (L) 01/25/2020    Results for  orders placed or performed in visit on 03/08/20  POCT urine pregnancy  Result Value Ref Range   Preg Test, Ur Negative Negative     Assessment:   Postpartum care following vaginal delivery Nexplanon insertion Lactating mother  Plan:    1. Contraception: Nexplanon - desires insertion today. UPT negative. See procedure note below.  2. Lactating mother, breastfeeding overall going well. Is supplementing as she is not sure she makes enough milk and her baby is still hungry at times. Can consider use of OTC supplements/herbal remedies to help increase supply.  3. Follow up in: 4-6 months for annual exam, or sooner as needed.    GYNECOLOGY OFFICE PROCEDURE NOTE  Kimberly Dunn is a 29 y.o. [phone] here for Nexplanon insertion.  Last pap smear was on 08/27/2017 and was normal.  No other gynecologic concerns.  Nexplanon Insertion Procedure Patient identified, informed consent performed, consent signed.   Patient does understand that irregular bleeding is a very common side effect of this medication. She was advised to have backup contraception for one week after placement. Pregnancy test in clinic today was negative.  Appropriate time out taken.  Patient's left arm was prepped and draped in the usual sterile fashion. The ruler used to measure and mark insertion area.  Patient was prepped with alcohol swab and then injected with 3 ml of 1% lidocaine.  She was prepped with betadine, Nexplanon removed from packaging,  Device confirmed in needle, then inserted full length of needle and withdrawn per handbook instructions. Nexplanon was  able to palpated in the patient's arm; patient palpated the insert herself. There was minimal blood loss.  Patient insertion site covered with guaze and a pressure bandage to reduce any bruising.  The patient tolerated the procedure well and was given post procedure instructions.    Lot: [id number] Exp: 03/05/2021 NDC 6333-5456-25    Hildred Laser,  MD [facility]

## 2020-03-08 NOTE — Patient Instructions (Signed)
NEXPLANON PLACEMENT POST-PROCEDURE INSTRUCTIONS  1. You may take Ibuprofen, Aleve or Tylenol for pain if needed.  Pain should resolve within in 24 hours.  2. You may have intercourse after 24 hours.  If you using this for birth control, it is effective immediately.  3. You need to call if you have any fever, heavy bleeding, or redness at insertion site. Irregular bleeding is common the first several months after having a Nexplanonplaced. You do not need to call for this reason unless you are concerned.  4. Shower or bathe as normal.  You can remove the bandage after 24 hours.     Etonogestrel implant What is this medicine? ETONOGESTREL (et oh noe JES trel) is a contraceptive (birth control) device. It is used to prevent pregnancy. It can be used for up to 3 years. This medicine may be used for other purposes; ask your health care provider or pharmacist if you have questions. COMMON BRAND NAME(S): Implanon, Nexplanon What should I tell my health care provider before I take this medicine? They need to know if you have any of these conditions:  abnormal vaginal bleeding  blood vessel disease or blood clots  breast, cervical, endometrial, ovarian, liver, or uterine cancer  diabetes  gallbladder disease  heart disease or recent heart attack  high blood pressure  high cholesterol or triglycerides  kidney disease  liver disease  migraine headaches  seizures  stroke  tobacco smoker  an unusual or allergic reaction to etonogestrel, anesthetics or antiseptics, other medicines, foods, dyes, or preservatives  pregnant or trying to get pregnant  breast-feeding How should I use this medicine? This device is inserted just under the skin on the inner side of your upper arm by a health care professional. Talk to your pediatrician regarding the use of this medicine in children. Special care may be needed. Overdosage: If you think you have taken too much of this medicine contact  a poison control center or emergency room at once. NOTE: This medicine is only for you. Do not share this medicine with others. What if I miss a dose? This does not apply. What may interact with this medicine? Do not take this medicine with any of the following medications:  amprenavir  fosamprenavir This medicine may also interact with the following medications:  acitretin  aprepitant  armodafinil  bexarotene  bosentan  carbamazepine  certain medicines for fungal infections like fluconazole, ketoconazole, itraconazole and voriconazole  certain medicines to treat hepatitis, HIV or AIDS  cyclosporine  felbamate  griseofulvin  lamotrigine  modafinil  oxcarbazepine  phenobarbital  phenytoin  primidone  rifabutin  rifampin  rifapentine  St. John's wort  topiramate This list may not describe all possible interactions. Give your health care provider a list of all the medicines, herbs, non-prescription drugs, or dietary supplements you use. Also tell them if you smoke, drink alcohol, or use illegal drugs. Some items may interact with your medicine. What should I watch for while using this medicine? This product does not protect you against HIV infection (AIDS) or other sexually transmitted diseases. You should be able to feel the implant by pressing your fingertips over the skin where it was inserted. Contact your doctor if you cannot feel the implant, and use a non-hormonal birth control method (such as condoms) until your doctor confirms that the implant is in place. Contact your doctor if you think that the implant may have broken or become bent while in your arm. You will receive a user card  from your health care provider after the implant is inserted. The card is a record of the location of the implant in your upper arm and when it should be removed. Keep this card with your health records. What side effects may I notice from receiving this medicine? Side  effects that you should report to your doctor or health care professional as soon as possible:  allergic reactions like skin rash, itching or hives, swelling of the face, lips, or tongue  breast lumps, breast tissue changes, or discharge  breathing problems  changes in emotions or moods  coughing up blood  if you feel that the implant may have broken or bent while in your arm  high blood pressure  pain, irritation, swelling, or bruising at the insertion site  scar at site of insertion  signs of infection at the insertion site such as fever, and skin redness, pain or discharge  signs and symptoms of a blood clot such as breathing problems; changes in vision; chest pain; severe, sudden headache; pain, swelling, warmth in the leg; trouble speaking; sudden numbness or weakness of the face, arm or leg  signs and symptoms of liver injury like dark yellow or brown urine; general ill feeling or flu-like symptoms; light-colored stools; loss of appetite; nausea; right upper belly pain; unusually weak or tired; yellowing of the eyes or skin  unusual vaginal bleeding, discharge Side effects that usually do not require medical attention (report to your doctor or health care professional if they continue or are bothersome):  acne  breast pain or tenderness  headache  irregular menstrual bleeding  nausea This list may not describe all possible side effects. Call your doctor for medical advice about side effects. You may report side effects to FDA at 1-800-FDA-1088. Where should I keep my medicine? This drug is given in a hospital or clinic and will not be stored at home. NOTE: This sheet is a summary. It may not cover all possible information. If you have questions about this medicine, talk to your doctor, pharmacist, or health care provider.  2020 Elsevier/Gold Standard (2019-06-08 11:33:04)

## 2020-07-10 NOTE — Progress Notes (Deleted)
Pt present for annual exam. Pt stated  

## 2020-07-11 ENCOUNTER — Encounter: Payer: Medicaid Other | Admitting: Obstetrics and Gynecology

## 2020-09-21 DIAGNOSIS — Z20822 Contact with and (suspected) exposure to covid-19: Secondary | ICD-10-CM | POA: Diagnosis not present

## 2020-09-21 DIAGNOSIS — R110 Nausea: Secondary | ICD-10-CM | POA: Diagnosis not present

## 2020-09-21 DIAGNOSIS — R519 Headache, unspecified: Secondary | ICD-10-CM | POA: Diagnosis not present

## 2020-10-20 ENCOUNTER — Encounter: Payer: Medicaid Other | Admitting: Obstetrics and Gynecology

## 2020-12-31 DIAGNOSIS — R42 Dizziness and giddiness: Secondary | ICD-10-CM | POA: Diagnosis not present

## 2021-01-23 ENCOUNTER — Encounter: Payer: Self-pay | Admitting: Obstetrics and Gynecology

## 2021-01-23 ENCOUNTER — Other Ambulatory Visit (HOSPITAL_COMMUNITY)
Admission: RE | Admit: 2021-01-23 | Discharge: 2021-01-23 | Disposition: A | Discharge status: Home / Self Care | Payer: Medicaid Other | Source: Ambulatory Visit | Attending: Obstetrics and Gynecology | Admitting: Obstetrics and Gynecology

## 2021-01-23 ENCOUNTER — Ambulatory Visit (INDEPENDENT_AMBULATORY_CARE_PROVIDER_SITE_OTHER): Payer: Medicaid Other | Admitting: Obstetrics and Gynecology

## 2021-01-23 ENCOUNTER — Other Ambulatory Visit: Payer: Self-pay

## 2021-01-23 VITALS — BP 95/65 | HR 58 | Ht 68.0 in | Wt 210.6 lb

## 2021-01-23 DIAGNOSIS — E669 Obesity, unspecified: Secondary | ICD-10-CM

## 2021-01-23 DIAGNOSIS — Z113 Encounter for screening for infections with a predominantly sexual mode of transmission: Secondary | ICD-10-CM | POA: Diagnosis not present

## 2021-01-23 DIAGNOSIS — Z01419 Encounter for gynecological examination (general) (routine) without abnormal findings: Secondary | ICD-10-CM | POA: Diagnosis not present

## 2021-01-23 DIAGNOSIS — Z124 Encounter for screening for malignant neoplasm of cervix: Secondary | ICD-10-CM | POA: Diagnosis not present

## 2021-01-23 DIAGNOSIS — R42 Dizziness and giddiness: Secondary | ICD-10-CM

## 2021-01-23 DIAGNOSIS — E66811 Obesity, class 1: Secondary | ICD-10-CM

## 2021-01-23 NOTE — Progress Notes (Signed)
Pt present for annual exam. Pt stated that about a month again she went to Urgent Care due to having dizziness. Pt stated that she was requested to monitor for symptoms and see when she has them. Pt stated that she has not seen the symptoms since then.

## 2021-01-23 NOTE — Patient Instructions (Signed)
Preventive Care 21-31 Years Old, Female Preventive care refers to lifestyle choices and visits with your health care provider that can promote health and wellness. This includes:  A yearly physical exam. This is also called an annual wellness visit.  Regular dental and eye exams.  Immunizations.  Screening for certain conditions.  Healthy lifestyle choices, such as: ? Eating a healthy diet. ? Getting regular exercise. ? Not using drugs or products that contain nicotine and tobacco. ? Limiting alcohol use. What can I expect for my preventive care visit? Physical exam Your health care provider may check your:  Height and weight. These may be used to calculate your BMI (body mass index). BMI is a measurement that tells if you are at a healthy weight.  Heart rate and blood pressure.  Body temperature.  Skin for abnormal spots. Counseling Your health care provider may ask you questions about your:  Past medical problems.  Family's medical history.  Alcohol, tobacco, and drug use.  Emotional well-being.  Home life and relationship well-being.  Sexual activity.  Diet, exercise, and sleep habits.  Work and work environment.  Access to firearms.  Method of birth control.  Menstrual cycle.  Pregnancy history. What immunizations do I need? Vaccines are usually given at various ages, according to a schedule. Your health care provider will recommend vaccines for you based on your age, medical history, and lifestyle or other factors, such as travel or where you work.   What tests do I need? Blood tests  Lipid and cholesterol levels. These may be checked every 5 years starting at age 20.  Hepatitis C test.  Hepatitis B test. Screening  Diabetes screening. This is done by checking your blood sugar (glucose) after you have not eaten for a while (fasting).  STD (sexually transmitted disease) testing, if you are at risk.  BRCA-related cancer screening. This may be  done if you have a family history of breast, ovarian, tubal, or peritoneal cancers.  Pelvic exam and Pap test. This may be done every 3 years starting at age 21. Starting at age 30, this may be done every 5 years if you have a Pap test in combination with an HPV test. Talk with your health care provider about your test results, treatment options, and if necessary, the need for more tests.   Follow these instructions at home: Eating and drinking  Eat a healthy diet that includes fresh fruits and vegetables, whole grains, lean protein, and low-fat dairy products.  Take vitamin and mineral supplements as recommended by your health care provider.  Do not drink alcohol if: ? Your health care provider tells you not to drink. ? You are pregnant, may be pregnant, or are planning to become pregnant.  If you drink alcohol: ? Limit how much you have to 0-1 drink a day. ? Be aware of how much alcohol is in your drink. In the [place] one drink equals one 12 oz bottle of beer (355 mL), one 5 oz glass of wine (148 mL), or one 1 oz glass of hard liquor (44 mL).   Lifestyle  Take daily care of your teeth and gums. Brush your teeth every morning and night with fluoride toothpaste. Floss one time each day.  Stay active. Exercise for at least 30 minutes 5 or more days each week.  Do not use any products that contain nicotine or tobacco, such as cigarettes, e-cigarettes, and chewing tobacco. If you need help quitting, ask your health care provider.  Do not   use drugs.  If you are sexually active, practice safe sex. Use a condom or other form of protection to prevent STIs (sexually transmitted infections).  If you do not wish to become pregnant, use a form of birth control. If you plan to become pregnant, see your health care provider for a prepregnancy visit.  Find healthy ways to cope with stress, such as: ? Meditation, yoga, or listening to music. ? Journaling. ? Talking to a trusted  person. ? Spending time with friends and family. Safety  Always wear your seat belt while driving or riding in a vehicle.  Do not drive: ? If you have been drinking alcohol. Do not ride with someone who has been drinking. ? When you are tired or distracted. ? While texting.  Wear a helmet and other protective equipment during sports activities.  If you have firearms in your house, make sure you follow all gun safety procedures.  Seek help if you have been physically or sexually abused. What's next?  Go to your health care provider once a year for an annual wellness visit.  Ask your health care provider how often you should have your eyes and teeth checked.  Stay up to date on all vaccines. This information is not intended to replace advice given to you by your health care provider. Make sure you discuss any questions you have with your health care provider. Document Revised: 04/23/2020 Document Reviewed: 05/07/2018 Elsevier Patient Education  2021 Elsevier Inc.     Breast Self-Awareness Breast self-awareness means being familiar with how your breasts look and feel. It involves checking your breasts regularly and reporting any changes to your health care provider. Practicing breast self-awareness is important. Sometimes changes may not be harmful (are benign), but sometimes a change in your breasts can be a sign of a serious medical problem. It is important to learn how to do this procedure correctly so that you can catch problems early, when treatment is more likely to be successful. All women should practice breast self-awareness, including women who have had breast implants. What you need:  A mirror.  A well-lit room. How to do a breast self-exam A breast self-exam is one way to learn what is normal for your breasts and whether your breasts are changing. To do a breast self-exam: Look for changes 1. Remove all the clothing above your waist. 2. Stand in front of a mirror  in a room with good lighting. 3. Put your hands on your hips. 4. Push your hands firmly downward. 5. Compare your breasts in the mirror. Look for differences between them (asymmetry), such as: ? Differences in shape. ? Differences in size. ? Puckers, dips, and bumps in one breast and not the other. 6. Look at each breast for changes in the skin, such as: ? Redness. ? Scaly areas. 7. Look for changes in your nipples, such as: ? Discharge. ? Bleeding. ? Dimpling. ? Redness. ? A change in position.   Feel for changes Carefully feel your breasts for lumps and changes. It is best to do this while lying on your back on the floor, and again while sitting or standing in the tub or shower with soapy water on your skin. Feel each breast in the following way: 1. Place the arm on the side of the breast you are examining above your head. 2. Feel your breast with the other hand. 3. Start in the nipple area and make -inch (2 cm) overlapping circles to feel your breast. Use   the pads of your three middle fingers to do this. Apply light pressure, then medium pressure, then firm pressure. The light pressure will allow you to feel the tissue closest to the skin. The medium pressure will allow you to feel the tissue that is a little deeper. The firm pressure will allow you to feel the tissue close to the ribs. 4. Continue the overlapping circles, moving downward over the breast until you feel your ribs below your breast. 5. Move one finger-width toward the center of the body. Continue to use the -inch (2 cm) overlapping circles to feel your breast as you move slowly up toward your collarbone. 6. Continue the up-and-down exam using all three pressures until you reach your armpit.   Write down what you find Writing down what you find can help you remember what to discuss with your health care provider. Write down:  What is normal for each breast.  Any changes that you find in each breast, including: ? The  kind of changes you find. ? Any pain or tenderness. ? Size and location of any lumps.  Where you are in your menstrual cycle, if you are still menstruating. General tips and recommendations  Examine your breasts every month.  If you are breastfeeding, the best time to examine your breasts is after a feeding or after using a breast pump.  If you menstruate, the best time to examine your breasts is 5-7 days after your period. Breasts are generally lumpier during menstrual periods, and it may be more difficult to notice changes.  With time and practice, you will become more familiar with the variations in your breasts and more comfortable with the exam. Contact a health care provider if you:  See a change in the shape or size of your breasts or nipples.  See a change in the skin of your breast or nipples, such as a reddened or scaly area.  Have unusual discharge from your nipples.  Find a lump or thick area that was not there before.  Have pain in your breasts.  Have any concerns related to your breast health. Summary  Breast self-awareness includes looking for physical changes in your breasts, as well as feeling for any changes within your breasts.  Breast self-awareness should be performed in front of a mirror in a well-lit room.  You should examine your breasts every month. If you menstruate, the best time to examine your breasts is 5-7 days after your menstrual period.  Let your health care provider know of any changes you notice in your breasts, including changes in size, changes on the skin, pain or tenderness, or unusual fluid from your nipples. This information is not intended to replace advice given to you by your health care provider. Make sure you discuss any questions you have with your health care provider. Document Revised: 04/14/2018 Document Reviewed: 04/14/2018 Elsevier Patient Education  2021 [employer].     Dizziness Dizziness is a common problem. It  makes you feel unsteady or light-headed. You may feel like you are about to pass out (faint). Dizziness can lead to getting hurt if you stumble or fall. Dizziness can be caused by many things, including:  Medicines.  Not having enough water in your body (dehydration).  Illness. Follow these instructions at home: Eating and drinking  Drink enough fluid to keep your pee (urine) clear or pale yellow. This helps to keep you from getting dehydrated. Try to drink more clear fluids, such as water.  Do not  drink alcohol.  Limit how much caffeine you drink or eat, if your doctor tells you to do that.  Limit how much salt (sodium) you drink or eat, if your doctor tells you to do that.   Activity  Avoid making quick movements. ? When you stand up from sitting in a chair, steady yourself until you feel okay. ? In the morning, first sit up on the side of the bed. When you feel okay, stand slowly while you hold onto something. Do this until you know that your balance is fine.  If you need to stand in one place for a long time, move your legs often. Tighten and relax the muscles in your legs while you are standing.  Do not drive or use heavy machinery if you feel dizzy.  Avoid bending down if you feel dizzy. Place items in your home so you can reach them easily without leaning over.   Lifestyle  Do not use any products that contain nicotine or tobacco, such as cigarettes and e-cigarettes. If you need help quitting, ask your doctor.  Try to lower your stress level. You can do this by using methods such as yoga or meditation. Talk with your doctor if you need help. General instructions  Watch your dizziness for any changes.  Take over-the-counter and prescription medicines only as told by your doctor. Talk with your doctor if you think that you are dizzy because of a medicine that you are taking.  Tell a friend or a family member that you are feeling dizzy. If he or she notices any changes in  your behavior, have this person call your doctor.  Keep all follow-up visits as told by your doctor. This is important. Contact a doctor if:  Your dizziness does not go away.  Your dizziness or light-headedness gets worse.  You feel sick to your stomach (nauseous).  You have trouble hearing.  You have new symptoms.  You are unsteady on your feet.  You feel like the room is spinning. Get help right away if:  You throw up (vomit) or have watery poop (diarrhea), and you cannot eat or drink anything.  You have trouble: ? Talking. ? Walking. ? Swallowing. ? Using your arms, hands, or legs.  You feel generally weak.  You are not thinking clearly, or you have trouble forming sentences. A friend or family member may notice this.  You have: ? Chest pain. ? Pain in your belly (abdomen). ? Shortness of breath. ? Sweating.  Your vision changes.  You are bleeding.  You have a very bad headache.  You have neck pain or a stiff neck.  You have a fever. These symptoms may be an emergency. Do not wait to see if the symptoms will go away. Get medical help right away. Call your local emergency services (911 in the [place] Do not drive yourself to the hospital. Summary  Dizziness makes you feel unsteady or light-headed. You may feel like you are about to pass out (faint).  Drink enough fluid to keep your pee (urine) clear or pale yellow. Do not drink alcohol.  Avoid making quick movements if you feel dizzy.  Watch your dizziness for any changes. This information is not intended to replace advice given to you by your health care provider. Make sure you discuss any questions you have with your health care provider. Document Revised: 05/17/2020 Document Reviewed: 09/12/2016 Elsevier Patient Education  [employer].

## 2021-01-23 NOTE — Progress Notes (Signed)
GYNECOLOGY ANNUAL PHYSICAL EXAM PROGRESS NOTE  Subjective:    Kimberly Dunn is a 30 y.o. [phone] female who presents for an annual exam.  The patient is sexually active. The patient wears seatbelts: yes. The patient participates in regular exercise: no. Has the patient ever been transfused or tattooed?: yes (professional tattoo). The patient reports that there is not domestic violence in her life.   The patient desires to note the following today:  1. Does report history of COVID-19 infection in January.  2. Reports occasional dizzy spells lately. Has been trying to increase her water intake. Was seen at Urgent Care a few months ago but could not figure out the cause. Has not had any in the past month. Thinks it may also be related to her h/o COVID.     Menstrual History: Menarche age: 31 Patient's last menstrual period was 11/07/2020 (within days). Period Duration (Days): 6-7 Period Pattern: (!) Irregular Menstrual Flow: Moderate,Heavy,Light Menstrual Control: Thin pad,Panty liner Menstrual Control Change Freq (Hours): 2-6 Dysmenorrhea: (!) Severe Dysmenorrhea Symptoms: Cramping,Nausea,Headache  Gynecologic History: Contraception: Nexplanon History of STI's: Syphilis (2019), treated. Last Pap: 08/27/2017. Results were: normal.  Remote h/o abnormal pap smear x 1 in 2012, ASCUS HRZ HPV+.    Upstream - 01/23/21 0813      Pregnancy Intention Screening   Does the patient want to become pregnant in the next year? No    Does the patient's partner want to become pregnant in the next year? Yes    Would the patient like to discuss contraceptive options today? No      Contraception Wrap Up   Current Method Hormonal Implant          The pregnancy intention screening data noted above was reviewed. Potential methods of contraception were not discussed. The patient elected to continue with Hormonal Implant.     OB History  Gravida Para Term Preterm AB Living  4 3 3  0 1 3   SAB IAB Ectopic Multiple Live Births  1 0 0 0 3    # Outcome Date GA Lbr Len/2nd Weight Sex Delivery Anes PTL Lv  4 Term 01/25/20 [redacted]w[redacted]d / 00:12 9 lb 1 oz (4.11 kg) M Vag-Spont None  LIV     Name: Smartt,BOY Ileana     Apgar1: 9  Apgar5: 9  3 Term 02/02/18 [redacted]w[redacted]d  7 lb 15 oz (3.6 kg) F Vag-Spont None  LIV     Birth Comments: N/A     Name: EVI, MCCOMB     Apgar1: 9  Apgar5: 9  2 SAB 2018          1 Term 12/17/11 [redacted]w[redacted]d / 00:25 7 lb 8 oz (3.402 kg) F Vag-Spont None  LIV     Name: Dauria,GIRL Imoni     Apgar1: 8  Apgar5: 9    Past Medical History:  Diagnosis Date  . Abnormal Pap smear   . Anemia   . HPV (human papilloma virus) infection     Past Surgical History:  Procedure Laterality Date  . CHOLECYSTECTOMY    . CONDYLOMA EXCISION/FULGURATION    . TONSILLECTOMY      Family History  Problem Relation Age of Onset  . Diabetes Mother   . Hypertension Father   . Hypertension Sister   . Anemia Sister   . Depression Sister   . Anesthesia problems Neg Hx     Social History   Socioeconomic History  . Marital status: Single    Spouse  name: Jonetta Speak  . Number of children: 2  . Years of education: 80  . Highest education level: GED or equivalent  Occupational History  . Occupation: Unemployed  Tobacco Use  . Smoking status: Never Smoker  . Smokeless tobacco: Never Used  Vaping Use  . Vaping Use: Never used  Substance and Sexual Activity  . Alcohol use: No  . Drug use: No  . Sexual activity: Yes    Birth control/protection: Implant    Comment: desires Implanon  Other Topics Concern  . Not on file  Social History Narrative  . Not on file   Social Determinants of Health   Financial Resource Strain: Not on file  Food Insecurity: Not on file  Transportation Needs: Not on file  Physical Activity: Not on file  Stress: Not on file  Social Connections: Not on file  Intimate Partner Violence: Not on file    Current Outpatient Medications on  File Prior to Visit  Medication Sig Dispense Refill  . acetaminophen (TYLENOL) 500 MG tablet Take 500 mg by mouth every 6 (six) hours as needed.    . etonogestrel (NEXPLANON) 68 MG IMPL implant 1 each by Subdermal route once.    . Multiple Vitamin (MULTI-VITAMINS) TABS Take by mouth.    . Pseudoeph-Doxylamine-DM-APAP (NYQUIL MULTI-SYMPTOM PO) Take by mouth. As needed    . [DISCONTINUED] sodium chloride (OCEAN NASAL SPRAY) 0.65 % nasal spray Place 1 spray into the nose as needed for congestion. 45 mL 3   No current facility-administered medications on file prior to visit.    No Known Allergies    Review of Systems Constitutional: negative for chills, fatigue, fevers and sweats Eyes: negative for irritation, redness and visual disturbance Ears, nose, mouth, throat, and face: negative for hearing loss, nasal congestion, snoring and tinnitus Respiratory: negative for asthma, cough, sputum Cardiovascular: negative for chest pain, dyspnea, exertional chest pressure/discomfort, irregular heart beat, palpitations and syncope Gastrointestinal: negative for abdominal pain, change in bowel habits, nausea and vomiting Genitourinary: negative for abnormal menstrual periods, genital lesions, sexual problems and vaginal discharge, dysuria and urinary incontinence Integument/breast: negative for breast lump, breast tenderness and nipple discharge Hematologic/lymphatic: negative for bleeding and easy bruising Musculoskeletal:negative for back pain and muscle weakness Neurological: negative for headaches, vertigo and weakness. Positive for dizziness (see HPI).  Endocrine: negative for diabetic symptoms including polydipsia, polyuria and skin dryness Allergic/Immunologic: negative for hay fever and urticaria       Objective:  Blood pressure 95/65, pulse (!) 58, height 5\' 8"  (1.727 m), weight 210 lb 9.6 oz (95.5 kg), last menstrual period 11/07/2020, currently breastfeeding. Body mass index is 32.02  kg/m.  General Appearance:    Alert, cooperative, no distress, appears stated age, mild obesity  Head:    Normocephalic, without obvious abnormality, atraumatic  Eyes:    PERRL, conjunctiva/corneas clear, EOM's intact, both eyes  Ears:    Normal external ear canals, both ears  Nose:   Nares normal, septum midline, mucosa normal, no drainage or sinus tenderness  Throat:   Lips, mucosa, and tongue normal; teeth and gums normal  Neck:   Supple, symmetrical, trachea midline, no adenopathy; thyroid: no enlargement/tenderness/nodules; no carotid bruit or JVD  Back:     Symmetric, no curvature, ROM normal, no CVA tenderness  Lungs:     Clear to auscultation bilaterally, respirations unlabored  Chest Wall:    No tenderness or deformity   Heart:    Regular rate and rhythm, S1 and S2 normal, no murmur, rub  or gallop  Breast Exam:    No tenderness, masses, or nipple abnormality  Abdomen:     Soft, non-tender, bowel sounds active all four quadrants, no masses, no organomegaly.    Genitalia:    Pelvic:external genitalia normal, vagina without lesions, discharge, or tenderness, rectovaginal septum  normal. Cervix normal in appearance, no cervical motion tenderness, no adnexal masses or tenderness.  Uterus normal size, shape, mobile, regular contours, nontender.  Rectal:    Normal external sphincter.  No hemorrhoids appreciated. Internal exam not done.   Extremities:   Extremities normal, atraumatic, no cyanosis or edema  Pulses:   2+ and symmetric all extremities  Skin:   Skin color, texture, turgor normal, no rashes or lesions  Lymph nodes:   Cervical, supraclavicular, and axillary nodes normal  Neurologic:   CNII-XII intact, normal strength, sensation and reflexes throughout   .  Labs:  Lab Results  Component Value Date   WBC 11.2 (H) 01/25/2020   HGB 11.2 (L) 01/25/2020   HCT 33.6 (L) 01/25/2020   MCV 81.6 01/25/2020   PLT 246 01/25/2020    Lab Results  Component Value Date   CREATININE  0.76 05/31/2019   BUN 10 05/31/2019   NA 137 05/31/2019   K 3.7 05/31/2019   CL 103 05/31/2019   CO2 26 05/31/2019    Lab Results  Component Value Date   ALT 8 05/31/2019   AST 12 (L) 05/31/2019   ALKPHOS 44 05/31/2019   BILITOT 0.6 05/31/2019    No results found for: TSH   Assessment:   1. Encounter for well woman exam with routine gynecological exam   2. Pap smear for cervical cancer screening   3. Screen for STD (sexually transmitted disease)   4. Obesity (BMI 30.0-34.9)   5. Dizziness of unknown cause    Plan:     Blood tests: CBC with diff, Comprehensive metabolic panel, Lipoproteins, TSH and HgbA1c. Breast self exam technique reviewed and patient encouraged to perform self-exam monthly. Contraception: Nexplanon (due for removal in 2 years). Discussed healthy lifestyle modifications. Pap smear performed today. Continue routine screenings.  Offered STD screening, patient desires vaginal cultures only, declines serologic testing. COVID vaccination status:  Has had COVID-19 infection.  Also received Moderna series, but has not yet gotten her booster.  Dizzy spells unclear cause, discussed differential.  Notes she has not had an episode in the past month. Advised that if she continues to have episodes, could consider referral to ENT, or Neurology if needed. Agreed that these could possibly be related to COVID infection as many different residual side effects and sequale of the virus have been noted.   Follow up in 1 year for annual exam.   Hildred Laser, MD [facility]

## 2021-01-24 LAB — COMPREHENSIVE METABOLIC PANEL
ALT: 13 IU/L (ref 0–32)
AST: 9 IU/L (ref 0–40)
Albumin/Globulin Ratio: 1.5 (ref 1.2–2.2)
Albumin: 4.1 g/dL (ref 3.9–5.0)
Alkaline Phosphatase: 47 IU/L (ref 44–121)
BUN/Creatinine Ratio: 15 (ref 9–23)
BUN: 11 mg/dL (ref 6–20)
Bilirubin Total: 0.5 mg/dL (ref 0.0–1.2)
CO2: 21 mmol/L (ref 20–29)
Calcium: 8.8 mg/dL (ref 8.7–10.2)
Chloride: 104 mmol/L (ref 96–106)
Creatinine, Ser: 0.74 mg/dL (ref 0.57–1.00)
Globulin, Total: 2.7 g/dL (ref 1.5–4.5)
Glucose: 91 mg/dL (ref 65–99)
Potassium: 4.3 mmol/L (ref 3.5–5.2)
Sodium: 139 mmol/L (ref 134–144)
Total Protein: 6.8 g/dL (ref 6.0–8.5)
eGFR: 112 mL/min/{1.73_m2} (ref >59)

## 2021-01-24 LAB — CERVICOVAGINAL ANCILLARY ONLY
Chlamydia: NEGATIVE
Comment: NEGATIVE
Comment: NORMAL
Neisseria Gonorrhea: NEGATIVE

## 2021-01-24 LAB — LIPID PANEL
Chol/HDL Ratio: 2.3 ratio (ref 0.0–4.4)
Cholesterol, Total: 122 mg/dL (ref 100–199)
HDL: 54 mg/dL (ref >39)
LDL Chol Calc (NIH): 55 mg/dL (ref 0–99)
Triglycerides: 58 mg/dL (ref 0–149)
VLDL Cholesterol Cal: 13 mg/dL (ref 5–40)

## 2021-01-24 LAB — HEMOGLOBIN A1C
Est. average glucose Bld gHb Est-mCnc: 108 mg/dL
Hgb A1c MFr Bld: 5.4 % (ref 4.8–5.6)

## 2021-01-24 LAB — TSH: TSH: 1.01 u[IU]/mL (ref 0.450–4.500)

## 2021-01-29 LAB — CYTOLOGY - PAP
Comment: NEGATIVE
Diagnosis: NEGATIVE
High risk HPV: NEGATIVE

## 2021-03-16 DIAGNOSIS — A059 Bacterial foodborne intoxication, unspecified: Secondary | ICD-10-CM | POA: Diagnosis not present

## 2021-04-23 ENCOUNTER — Telehealth: Payer: Self-pay | Admitting: Obstetrics and Gynecology

## 2021-04-24 NOTE — Telephone Encounter (Signed)
ERROR

## 2021-04-25 DIAGNOSIS — M7918 Myalgia, other site: Secondary | ICD-10-CM | POA: Diagnosis not present

## 2021-04-25 DIAGNOSIS — M545 Low back pain, unspecified: Secondary | ICD-10-CM | POA: Diagnosis not present

## 2021-04-25 DIAGNOSIS — M549 Dorsalgia, unspecified: Secondary | ICD-10-CM | POA: Diagnosis not present

## 2021-05-15 ENCOUNTER — Telehealth: Payer: Self-pay | Admitting: Obstetrics and Gynecology

## 2021-05-15 ENCOUNTER — Ambulatory Visit (INDEPENDENT_AMBULATORY_CARE_PROVIDER_SITE_OTHER): Payer: Medicaid Other | Admitting: Obstetrics and Gynecology

## 2021-05-15 ENCOUNTER — Other Ambulatory Visit: Payer: Self-pay

## 2021-05-15 ENCOUNTER — Encounter: Payer: Self-pay | Admitting: Obstetrics and Gynecology

## 2021-05-15 VITALS — BP 104/70 | HR 75 | Ht 68.0 in | Wt 202.1 lb

## 2021-05-15 DIAGNOSIS — R198 Other specified symptoms and signs involving the digestive system and abdomen: Secondary | ICD-10-CM | POA: Diagnosis not present

## 2021-05-15 DIAGNOSIS — G8929 Other chronic pain: Secondary | ICD-10-CM | POA: Diagnosis not present

## 2021-05-15 DIAGNOSIS — R1030 Lower abdominal pain, unspecified: Secondary | ICD-10-CM

## 2021-05-15 DIAGNOSIS — M5441 Lumbago with sciatica, right side: Secondary | ICD-10-CM | POA: Diagnosis not present

## 2021-05-15 NOTE — Progress Notes (Signed)
    GYNECOLOGY PROGRESS NOTE  Subjective:    Patient ID: Kimberly Dunn, female    DOB: June 30, 1991, 30 y.o.   MRN: [id number]  HPI  Patient is a 30 y.o. [phone] female who presents for back pain. Patient stated that during and after the birth of her son over 1 year ago she has noticed mid/lower back pain that spreads down to her right knee. Patient stated that after light house duties she is unable to walk and her back feels like "it is going to break".  Can only walk actively/exercising for 10 minutes at a time before noting the pain. Notes her son weighs as much as a 94-year old does.  Does back exercises, notes that this helps but takes a while for the pain to get better (several hours).   Also complains of abdominal/GI issues since having a GI bug last month.  Has had issues with digestion and pain. Has been eliminating certain foods from her diet.  Sometimes notes problems with diarrhea, then alternates with constipation with 3-4 days.  Is taking a detox tea to attempt to stay regular.   The following portions of the patient's history were reviewed and updated as appropriate: allergies, current medications, past family history, past medical history, past social history, past surgical history, and problem list.   Review of Systems Pertinent items noted in HPI and remainder of comprehensive ROS otherwise negative.   Objective:   Blood pressure 104/70, pulse 75, height 5\' 8"  (1.727 m), weight 202 lb 1.6 oz (91.7 kg), last menstrual period 04/19/2021, currently breastfeeding.  Body mass index is 30.73 kg/m. General appearance: alert, cooperative, appears stated age, and no distress Abdomen: soft, non-tender; bowel sounds normal; no masses,  no organomegaly Back: symmetric, no curvature. ROM normal. No CVA tenderness. Extremities: extremities normal, atraumatic, no cyanosis or edema. Positive raised straight leg test on right.  Neurologic: Grossly intact   Assessment:   1. Chronic  bilateral low back pain with right-sided sciatica   2. Lower abdominal pain   3. Change in bowel function      Plan:   Discussed proper lifting techniques. Encouraged seeing chiropractor or physical therapy for back pain. Continue back exercises.  Lower abdominal pain with changes in bowel function s/p viral GI infection.  Desires referral to GI.    06/19/2021, MD [facility]

## 2021-05-15 NOTE — Telephone Encounter (Signed)
Kimberly Dunn called in and stated the chiropractor that she was referred to by Dr. Valentino Saxon is going to cost $60 per hour.  So, Kimberly Dunn would like to be referred to [name] at [facility].  Please advise.

## 2021-05-15 NOTE — Patient Instructions (Signed)

## 2021-05-16 ENCOUNTER — Other Ambulatory Visit: Payer: Self-pay

## 2021-05-16 DIAGNOSIS — R1030 Lower abdominal pain, unspecified: Secondary | ICD-10-CM

## 2021-05-16 DIAGNOSIS — G8929 Other chronic pain: Secondary | ICD-10-CM

## 2021-05-16 NOTE — Telephone Encounter (Signed)
Patient called no answer and vm picked up.

## 2021-05-16 NOTE — Telephone Encounter (Signed)
Spoke to pt and she aware that a new referral to rehab at [facility] has been placed for her.

## 2021-07-11 ENCOUNTER — Ambulatory Visit: Payer: Medicaid Other | Admitting: Gastroenterology

## 2022-01-01 ENCOUNTER — Other Ambulatory Visit: Payer: Self-pay

## 2022-01-01 ENCOUNTER — Emergency Department: Payer: Medicaid Other

## 2022-01-01 ENCOUNTER — Emergency Department
Admission: EM | Admit: 2022-01-01 | Discharge: 2022-01-01 | Disposition: A | Discharge status: Home / Self Care | Payer: Medicaid Other | Attending: Emergency Medicine | Admitting: Emergency Medicine

## 2022-01-01 DIAGNOSIS — R519 Headache, unspecified: Secondary | ICD-10-CM | POA: Diagnosis not present

## 2022-01-01 DIAGNOSIS — N939 Abnormal uterine and vaginal bleeding, unspecified: Secondary | ICD-10-CM | POA: Diagnosis not present

## 2022-01-01 DIAGNOSIS — M79605 Pain in left leg: Secondary | ICD-10-CM | POA: Diagnosis not present

## 2022-01-01 DIAGNOSIS — M79652 Pain in left thigh: Secondary | ICD-10-CM | POA: Diagnosis not present

## 2022-01-01 DIAGNOSIS — M79662 Pain in left lower leg: Secondary | ICD-10-CM | POA: Diagnosis not present

## 2022-01-01 LAB — URINALYSIS, ROUTINE W REFLEX MICROSCOPIC
Bacteria, UA: NONE SEEN
Bilirubin Urine: NEGATIVE
Glucose, UA: NEGATIVE mg/dL
Ketones, ur: NEGATIVE mg/dL
Leukocytes,Ua: NEGATIVE
Nitrite: NEGATIVE
Protein, ur: NEGATIVE mg/dL
Specific Gravity, Urine: 1.011 (ref 1.005–1.030)
pH: 7 (ref 5.0–8.0)

## 2022-01-01 LAB — COMPREHENSIVE METABOLIC PANEL
ALT: 13 U/L (ref 0–44)
AST: 17 U/L (ref 15–41)
Albumin: 4.1 g/dL (ref 3.5–5.0)
Alkaline Phosphatase: 43 U/L (ref 38–126)
Anion gap: 7 (ref 5–15)
BUN: 12 mg/dL (ref 6–20)
CO2: 27 mmol/L (ref 22–32)
Calcium: 9.3 mg/dL (ref 8.9–10.3)
Chloride: 104 mmol/L (ref 98–111)
Creatinine, Ser: 0.7 mg/dL (ref 0.44–1.00)
GFR, Estimated: >60 mL/min (ref >60)
Glucose, Bld: 102 mg/dL — ABNORMAL HIGH (ref 70–99)
Potassium: 3.6 mmol/L (ref 3.5–5.1)
Sodium: 138 mmol/L (ref 135–145)
Total Bilirubin: 0.8 mg/dL (ref 0.3–1.2)
Total Protein: 7.7 g/dL (ref 6.5–8.1)

## 2022-01-01 LAB — PROTIME-INR
INR: 1.1 (ref 0.8–1.2)
Prothrombin Time: 13.9 seconds (ref 11.4–15.2)

## 2022-01-01 LAB — CBC WITH DIFFERENTIAL/PLATELET
Abs Immature Granulocytes: 0.02 10*3/uL (ref 0.00–0.07)
Basophils Absolute: 0 10*3/uL (ref 0.0–0.1)
Basophils Relative: 1 %
Eosinophils Absolute: 0.1 10*3/uL (ref 0.0–0.5)
Eosinophils Relative: 1 %
HCT: 34 % — ABNORMAL LOW (ref 36.0–46.0)
Hemoglobin: 11 g/dL — ABNORMAL LOW (ref 12.0–15.0)
Immature Granulocytes: 0 %
Lymphocytes Relative: 28 %
Lymphs Abs: 1.9 10*3/uL (ref 0.7–4.0)
MCH: 26 pg (ref 26.0–34.0)
MCHC: 32.4 g/dL (ref 30.0–36.0)
MCV: 80.4 fL (ref 80.0–100.0)
Monocytes Absolute: 0.4 10*3/uL (ref 0.1–1.0)
Monocytes Relative: 6 %
Neutro Abs: 4.4 10*3/uL (ref 1.7–7.7)
Neutrophils Relative %: 64 %
Platelets: 307 10*3/uL (ref 150–400)
RBC: 4.23 MIL/uL (ref 3.87–5.11)
RDW: 14.6 % (ref 11.5–15.5)
WBC: 6.9 10*3/uL (ref 4.0–10.5)
nRBC: 0 % (ref 0.0–0.2)

## 2022-01-01 LAB — SEDIMENTATION RATE: Sed Rate: 19 mm/hr (ref 0–20)

## 2022-01-01 LAB — POC URINE PREG, ED: Preg Test, Ur: NEGATIVE

## 2022-01-01 MED ORDER — MELOXICAM 15 MG PO TABS: 15.0000 mg | ORAL_TABLET | Freq: Every day | ORAL | 0 refills | Status: DC

## 2022-01-01 MED ORDER — BUTALBITAL-APAP-CAFFEINE 50-325-40 MG PO TABS
1.0000 | ORAL_TABLET | Freq: Once | ORAL | Status: AC
  Administered 2022-01-01: 1 via ORAL
  Filled 2022-01-01: qty 1

## 2022-01-01 MED ORDER — KETOROLAC TROMETHAMINE 30 MG/ML IJ SOLN
30.0000 mg | Freq: Once | INTRAMUSCULAR | Status: AC
  Administered 2022-01-01: 30 mg via INTRAVENOUS
  Filled 2022-01-01: qty 1

## 2022-01-01 MED ORDER — BUTALBITAL-APAP-CAFFEINE 50-325-40 MG PO TABS: 1.0000 | ORAL_TABLET | Freq: Four times a day (QID) | ORAL | 0 refills | Status: DC | PRN

## 2022-01-01 NOTE — ED Provider Notes (Signed)
? ?[facility] ?Provider Note ? ?Patient Contact: 6:34 PM (approximate) ? ? ?History  ? ?Headache and thigh pain ? ? ?HPI ? ?Kimberly Dunn is a 31 y.o. female who presents to the emergency department complaining of sudden onset left-sided headache and left thigh pain.  Patient states that the pain woke her up this morning roughly around 230.  She denies any recent trauma to her head or leg.  She denies any fevers, chills, URI symptoms.  No recent illnesses.  Patient denies any unilateral weakness.  She has been trying to stretch her leg, used Excedrin earlier today without complete improvement.  Excedrin did help her headache somewhat.  No visual changes, neck pain or stiffness, chest pain, GI complaints.  Patient states that she has infrequent menstrual cycles due to the Nexplanon, currently has slight vaginal bleeding.  Patient with a history of anemia, history of latent syphilis.  She states that she has been treated for the syphilis. ?  ? ? ?Physical Exam  ? ?Triage Vital Signs: ?ED Triage Vitals  ?Enc Vitals Group  ?   BP 01/01/22 1800 (!) 119/95  ?   Pulse Rate 01/01/22 1800 84  ?   Resp 01/01/22 1800 15  ?   Temp 01/01/22 1800 98.4 ?F (36.9 ?C)  ?   Temp Source 01/01/22 1800 Oral  ?   SpO2 01/01/22 1800 100 %  ?   Weight 01/01/22 1801 210 lb (95.3 kg)  ?   Height 01/01/22 1801 $RemoveBefor'5\' 8"'nEABnMNPCKEz$  (1.727 m)  ?   Head Circumference --   ?   Peak Flow --   ?   Pain Score 01/01/22 1801 5  ?   Pain Loc --   ?   Pain Edu? --   ?   Excl. in [place]? --   ? ? ?Most recent vital signs: ?Vitals:  ? 01/01/22 1800 01/01/22 2111  ?BP: (!) 119/95 121/75  ?Pulse: 84 (!) 58  ?Resp: 15 16  ?Temp: 98.4 ?F (36.9 ?C)   ?SpO2: 100% 99%  ? ? ? ?General: Alert and in no acute distress. ?Eyes:  PERRL. EOMI. ?Head: No acute traumatic findings.  No palpable abnormality about the left temporal region.  No palpable cords.  Slight tenderness in this region however.  No overlying skin changes.  ?Neck: No stridor. No cervical  spine tenderness to palpation.  ?Cardiovascular:  Good peripheral perfusion ?Respiratory: Normal respiratory effort without tachypnea or retractions. Lungs CTAB. Good air entry to the bases with no decreased or absent breath sounds. ?Musculoskeletal: Full range of motion to all extremities.  Visualization of the left lower extremity reveals no gross erythema, edema.  Patient is tender to palpation along the medial thigh.  There is no palpable lesions in this area.  Again no overlying skin changes.  No significant warmth to palpation. ?Neurologic:  No gross focal neurologic deficits are appreciated.  ?Skin:   No rash noted ?Other: ? ? ?ED Results / Procedures / Treatments  ? ?Labs ?(all labs ordered are listed, but only abnormal results are displayed) ?Labs Reviewed  ?COMPREHENSIVE METABOLIC PANEL - Abnormal; Notable for the following components:  ?    Result Value  ? Glucose, Bld 102 (*)   ? All other components within normal limits  ?CBC WITH DIFFERENTIAL/PLATELET - Abnormal; Notable for the following components:  ? Hemoglobin 11.0 (*)   ? HCT 34.0 (*)   ? All other components within normal limits  ?URINALYSIS, ROUTINE W REFLEX MICROSCOPIC - Abnormal;  Notable for the following components:  ? Color, Urine STRAW (*)   ? APPearance CLEAR (*)   ? Hgb urine dipstick LARGE (*)   ? All other components within normal limits  ?PROTIME-INR  ?SEDIMENTATION RATE  ?C-REACTIVE PROTEIN  ?POC URINE PREG, ED  ? ? ? ?EKG ? ? ? ? ?RADIOLOGY ? ?I personally viewed and evaluated these images as part of my medical decision making, as well as reviewing the written report by the radiologist. ? ?ED Provider Interpretation: No acute findings on imaging of the head, ultrasound left lower extremity ? ?CT Head Wo Contrast ? ?Result Date: 01/01/2022 ?CLINICAL DATA:  Headache, sudden, severe EXAM: CT HEAD WITHOUT CONTRAST TECHNIQUE: Contiguous axial images were obtained from the base of the skull through the vertex without intravenous contrast.  RADIATION DOSE REDUCTION: This exam was performed according to the departmental dose-optimization program which includes automated exposure control, adjustment of the mA and/or kV according to patient size and/or use of iterative reconstruction technique. COMPARISON:  None. FINDINGS: Brain: No evidence of acute infarction, hemorrhage, hydrocephalus, extra-axial collection or mass lesion/mass effect. Vascular: No hyperdense vessel or unexpected calcification. Skull: Normal. Negative for fracture or focal lesion. Sinuses/Orbits: No acute finding. Other: None. IMPRESSION: No acute intracranial abnormality. Electronically Signed   By: Keane Police D.O.   On: 01/01/2022 19:24  ? ?US Venous Img Lower Unilateral Left ? ?Result Date: 01/01/2022 ?CLINICAL DATA:  Left thigh pain EXAM: LEFT LOWER EXTREMITY VENOUS DOPPLER ULTRASOUND TECHNIQUE: Gray-scale sonography with compression, as well as color and duplex ultrasound, were performed to evaluate the deep venous system(s) from the level of the common femoral vein through the popliteal and proximal calf veins. COMPARISON:  None. FINDINGS: VENOUS Normal compressibility of the common femoral, superficial femoral, and popliteal veins, as well as the visualized calf veins. Visualized portions of profunda femoral vein and great saphenous vein unremarkable. No filling defects to suggest DVT on grayscale or color Doppler imaging. Doppler waveforms show normal direction of venous flow, normal respiratory plasticity and response to augmentation. OTHER None. Limitations: none IMPRESSION: Negative. Electronically Signed   By: Fidela Salisbury M.D.   On: 01/01/2022 20:40   ? ?PROCEDURES: ? ?Critical Care performed: No ? ?Procedures ? ? ?MEDICATIONS ORDERED IN ED: ?Medications  ?ketorolac (TORADOL) 30 MG/ML injection 30 mg (has no administration in time range)  ?butalbital-acetaminophen-caffeine (FIORICET) 50-325-40 MG per tablet 1 tablet (has no administration in time range)   ? ? ? ?IMPRESSION / MDM / ASSESSMENT AND PLAN / ED COURSE  ?I reviewed the triage vital signs and the nursing notes. ?             ?               ? ?Differential diagnosis includes, but is not limited to, migraine, CVA, temporal arteritis, DVT, leg strain, sciatica ? ? ?Patient's diagnosis is consistent with headache, leg pain.  Patient presents to the ED with sudden onset of left sided headache and left leg pain.  She states that the pain was bad enough to wake her out of sleep.  She states that she has felt off today.  Overall exam is reassuring with no acute deficits on cranial nerve testing, no acute findings to the left lower extremity.  Given her complaints patient had imaging, labs performed.  Work-up to include CT scan, ultrasound, labs is reassuring.  Slight amount of hemoglobin seen in urinalysis but she is menstruating at this time.  Patient with otherwise reassuring labs  no elevation in ESR or CRP to be concern for temporal arteritis.  Patient has reassuring white blood cell count, hepatic and renal labs..  This time is in the controlled medication for the patient.  Follow-up with primary care as needed.  Return precautions discussed with the patient.  Patient is given ED precautions to return to the ED for any worsening or new symptoms. ? ? ? ?  ? ? ?FINAL CLINICAL IMPRESSION(S) / ED DIAGNOSES  ? ?Final diagnoses:  ?Bad headache  ?Pain of left lower extremity  ? ? ? ?Rx / DC Orders  ? ?ED Discharge Orders   ? ?      Ordered  ?  meloxicam (MOBIC) 15 MG tablet  Daily       ? 01/01/22 2224  ?  butalbital-acetaminophen-caffeine (FIORICET) 50-325-40 MG tablet  Every 6 hours PRN       ? 01/01/22 2224  ? ?  ?  ? ?  ? ? ? ?Note:  This document was prepared using Dragon voice recognition software and may include unintentional dictation errors. ?  ?Darletta Moll, PA-C ?01/01/22 2224 ? ?  ?Harvest Dark, MD ?01/01/22 2233 ? ?

## 2022-01-01 NOTE — ED Notes (Signed)
See triage note  presents with left side headache and some photo sensitivity   also havin sharp stabbing pain to inner thigh   ?

## 2022-01-01 NOTE — Progress Notes (Deleted)
    GYNECOLOGY PROGRESS NOTE  Subjective:    Patient ID: Kimberly Dunn, female    DOB: 1991/08/16, 31 y.o.   MRN: [id number]  HPI  Patient is a 31 y.o. [phone] female who presents for groin pain and headaches.  {Common ambulatory SmartLinks:19316}  Review of Systems {ros; complete:30496}   Objective:   currently breastfeeding. There is no height or weight on file to calculate BMI. General appearance: {general exam:16600} Abdomen: {abdominal exam:16834} Pelvic: {pelvic exam:16852::"cervix normal in appearance","external genitalia normal","no adnexal masses or tenderness","no cervical motion tenderness","rectovaginal septum normal","uterus normal size, shape, and consistency","vagina normal without discharge"} Extremities: {extremity exam:5109} Neurologic: {neuro exam:17854}   Assessment:   No diagnosis found.   Plan:   There are no diagnoses linked to this encounter.    Hildred Laser, MD [facility]

## 2022-01-01 NOTE — ED Notes (Signed)
Pt signed esignature  d/c inst to pt.   

## 2022-01-01 NOTE — ED Triage Notes (Signed)
Pt to ED for HA across L side of head and face ace and photosensitivity since last night. ? ?Pt also complaining of sharp and stabbing pain to L inner thigh.  ? ?Slept poorly last night and feels "drained and out of it". Pt took Excedrin around 1130am with some relief. ?

## 2022-01-02 ENCOUNTER — Telehealth: Payer: Self-pay

## 2022-01-02 LAB — C-REACTIVE PROTEIN: CRP: 0.7 mg/dL (ref <1.0)

## 2022-01-02 NOTE — Telephone Encounter (Signed)
Transition Care Management Unsuccessful Follow-up Telephone Call ? ?Date of discharge and from where:  01/01/2022 from [facility] ? ?Attempts:  1st Attempt ? ?Reason for unsuccessful TCM follow-up call:  Left voice message ? ? ? ?

## 2022-01-03 ENCOUNTER — Encounter: Payer: Medicaid Other | Admitting: Obstetrics and Gynecology

## 2022-01-03 NOTE — Telephone Encounter (Signed)
Transition Care Management Unsuccessful Follow-up Telephone Call ? ?Date of discharge and from where:  01/01/2022-[facility] ? ?Attempts:  2nd Attempt ? ?Reason for unsuccessful TCM follow-up call:  Left voice message ? ?  ?

## 2022-01-04 NOTE — Telephone Encounter (Signed)
Transition Care Management Unsuccessful Follow-up Telephone Call ? ?Date of discharge and from where:  01/01/2022 from [facility] ? ?Attempts:  3rd Attempt ? ?Reason for unsuccessful TCM follow-up call:  Unable to reach patient ? ? ? ?

## 2022-01-24 ENCOUNTER — Encounter: Payer: Medicaid Other | Admitting: Obstetrics and Gynecology

## 2022-04-02 ENCOUNTER — Encounter: Payer: Medicaid Other | Admitting: Obstetrics and Gynecology

## 2022-04-02 NOTE — Progress Notes (Unsigned)
GYNECOLOGY ANNUAL PHYSICAL EXAM PROGRESS NOTE  Subjective:    Kimberly Dunn is a 31 y.o. [phone] female who presents for an annual exam. The patient has no complaints today. The patient {is/is not/has never been:13135} sexually active. The patient participates in regular exercise: {yes/no/not asked:9010}. Has the patient ever been transfused or tattooed?: {yes/no/not asked:9010}. The patient reports that there {is/is not:9024} domestic violence in her life.    Menstrual History: Menarche age: *** No LMP recorded.     Gynecologic History:  Contraception: {method:5051} History of STI's:  Last Pap: ***. Results were: {norm/abn:16337}.  ***Denies/Notes h/o abnormal pap smears. Last mammogram: ***. Results were: {norm/abn:16337}       OB History  Gravida Para Term Preterm AB Living  4 3 3  0 1 3  SAB IAB Ectopic Multiple Live Births  1 0 0 0 3    # Outcome Date GA Lbr Len/2nd Weight Sex Delivery Anes PTL Lv  4 Term 01/25/20 [redacted]w[redacted]d / 00:12 9 lb 1 oz (4.11 kg) M Vag-Spont None  LIV     Name: Kimberly Dunn     Apgar1: 9  Apgar5: 9  3 Term 02/02/18 [redacted]w[redacted]d  7 lb 15 oz (3.6 kg) F Vag-Spont None  LIV     Birth Comments: N/A     Name: Kimberly Dunn     Apgar1: 9  Apgar5: 9  2 SAB 2018          1 Term 12/17/11 [redacted]w[redacted]d / 00:25 7 lb 8 oz (3.402 kg) F Vag-Spont None  LIV     Name: Kimberly Dunn     Apgar1: 8  Apgar5: 9    Past Medical History:  Diagnosis Date   Abnormal Pap smear    Anemia    HPV (human papilloma virus) infection     Past Surgical History:  Procedure Laterality Date   CHOLECYSTECTOMY     CONDYLOMA EXCISION/FULGURATION     TONSILLECTOMY      Family History  Problem Relation Age of Onset   Diabetes Mother    Hypertension Father    Hypertension Sister    Anemia Sister    Depression Sister    Anesthesia problems Neg Hx     Social History   Socioeconomic History   Marital status: Single    Spouse name: Luis    Number of children: 2   Years of education: 13   Highest education level: GED or equivalent  Occupational History   Occupation: Unemployed  Tobacco Use   Smoking status: Never   Smokeless tobacco: Never  Vaping Use   Vaping Use: Some days  Substance and Sexual Activity   Alcohol use: No   Drug use: No   Sexual activity: Yes    Birth control/protection: Implant    Comment: desires Implanon  Other Topics Concern   Not on file  Social History Narrative   Not on file   Social Determinants of Health   Financial Resource Strain: Not on file  Food Insecurity: Not on file  Transportation Needs: Not on file  Physical Activity: Not on file  Stress: Not on file  Social Connections: Not on file  Intimate Partner Violence: Not on file    Current Outpatient Medications on File Prior to Visit  Medication Sig Dispense Refill   acetaminophen (TYLENOL) 500 MG tablet Take 500 mg by mouth every 6 (six) hours as needed.     butalbital-acetaminophen-caffeine (FIORICET) 50-325-40 MG tablet Take 1 tablet by mouth every 6 (six)  hours as needed for headache. 10 tablet 0   etonogestrel (NEXPLANON) 68 MG IMPL implant 1 each by Subdermal route once.     Ibuprofen 200 MG CAPS Take by mouth.     meloxicam (MOBIC) 15 MG tablet Take 1 tablet (15 mg total) by mouth daily. 30 tablet 0   Multiple Vitamin (MULTI-VITAMINS) TABS Take by mouth.     [DISCONTINUED] sodium chloride (OCEAN NASAL SPRAY) 0.65 % nasal spray Place 1 spray into the nose as needed for congestion. 45 mL 3   No current facility-administered medications on file prior to visit.    No Known Allergies   Review of Systems Constitutional: negative for chills, fatigue, fevers and sweats Eyes: negative for irritation, redness and visual disturbance Ears, nose, mouth, throat, and face: negative for hearing loss, nasal congestion, snoring and tinnitus Respiratory: negative for asthma, cough, sputum Cardiovascular: negative for chest pain,  dyspnea, exertional chest pressure/discomfort, irregular heart beat, palpitations and syncope Gastrointestinal: negative for abdominal pain, change in bowel habits, nausea and vomiting Genitourinary: negative for abnormal menstrual periods, genital lesions, sexual problems and vaginal discharge, dysuria and urinary incontinence Integument/breast: negative for breast lump, breast tenderness and nipple discharge Hematologic/lymphatic: negative for bleeding and easy bruising Musculoskeletal:negative for back pain and muscle weakness Neurological: negative for dizziness, headaches, vertigo and weakness Endocrine: negative for diabetic symptoms including polydipsia, polyuria and skin dryness Allergic/Immunologic: negative for hay fever and urticaria      Objective:  currently breastfeeding. There is no height or weight on file to calculate BMI.    General Appearance:    Alert, cooperative, no distress, appears stated age  Head:    Normocephalic, without obvious abnormality, atraumatic  Eyes:    PERRL, conjunctiva/corneas clear, EOM's intact, both eyes  Ears:    Normal external ear canals, both ears  Nose:   Nares normal, septum midline, mucosa normal, no drainage or sinus tenderness  Throat:   Lips, mucosa, and tongue normal; teeth and gums normal  Neck:   Supple, symmetrical, trachea midline, no adenopathy; thyroid: no enlargement/tenderness/nodules; no carotid bruit or JVD  Back:     Symmetric, no curvature, ROM normal, no CVA tenderness  Lungs:     Clear to auscultation bilaterally, respirations unlabored  Chest Wall:    No tenderness or deformity   Heart:    Regular rate and rhythm, S1 and S2 normal, no murmur, rub or gallop  Breast Exam:    No tenderness, masses, or nipple abnormality  Abdomen:     Soft, non-tender, bowel sounds active all four quadrants, no masses, no organomegaly.    Genitalia:    Pelvic:external genitalia normal, vagina without lesions, discharge, or tenderness,  rectovaginal septum  normal. Cervix normal in appearance, no cervical motion tenderness, no adnexal masses or tenderness.  Uterus normal size, shape, mobile, regular contours, nontender.  Rectal:    Normal external sphincter.  No hemorrhoids appreciated. Internal exam not done.   Extremities:   Extremities normal, atraumatic, no cyanosis or edema  Pulses:   2+ and symmetric all extremities  Skin:   Skin color, texture, turgor normal, no rashes or lesions  Lymph nodes:   Cervical, supraclavicular, and axillary nodes normal  Neurologic:   CNII-XII intact, normal strength, sensation and reflexes throughout   .  Labs:  Lab Results  Component Value Date   WBC 6.9 01/01/2022   HGB 11.0 (L) 01/01/2022   HCT 34.0 (L) 01/01/2022   MCV 80.4 01/01/2022   PLT 307 01/01/2022  Lab Results  Component Value Date   CREATININE 0.70 01/01/2022   BUN 12 01/01/2022   NA 138 01/01/2022   K 3.6 01/01/2022   CL 104 01/01/2022   CO2 27 01/01/2022    Lab Results  Component Value Date   ALT 13 01/01/2022   AST 17 01/01/2022   ALKPHOS 43 01/01/2022   BILITOT 0.8 01/01/2022    Lab Results  Component Value Date   TSH 1.010 01/23/2021     Assessment:   No diagnosis found.   Plan:  Blood tests: {blood tests:13147}. Breast self exam technique reviewed and patient encouraged to perform self-exam monthly. Contraception: {contraceptive methods:5051}. Discussed healthy lifestyle modifications. Mammogram {discussed/ordered:14545} Pap smear {discussed/ordered:14545}. COVID vaccination status: Follow up in 1 year for annual exam   Loney Laurence, CMA [facility]

## 2022-04-03 ENCOUNTER — Encounter: Payer: Self-pay | Admitting: Obstetrics and Gynecology

## 2022-04-03 ENCOUNTER — Ambulatory Visit (INDEPENDENT_AMBULATORY_CARE_PROVIDER_SITE_OTHER): Payer: Medicaid Other | Admitting: Obstetrics and Gynecology

## 2022-04-03 VITALS — BP 106/62 | HR 62 | Resp 16 | Ht 68.0 in | Wt 215.8 lb

## 2022-04-03 DIAGNOSIS — E669 Obesity, unspecified: Secondary | ICD-10-CM | POA: Diagnosis not present

## 2022-04-03 DIAGNOSIS — Z975 Presence of (intrauterine) contraceptive device: Secondary | ICD-10-CM

## 2022-04-03 DIAGNOSIS — Z01419 Encounter for gynecological examination (general) (routine) without abnormal findings: Secondary | ICD-10-CM | POA: Diagnosis not present

## 2022-04-03 DIAGNOSIS — Z3009 Encounter for other general counseling and advice on contraception: Secondary | ICD-10-CM | POA: Diagnosis not present

## 2022-04-29 NOTE — Progress Notes (Unsigned)
    GYNECOLOGY OFFICE PROCEDURE NOTE  Kimberly Dunn is a 30 y.o. [phone] here for Nexplanon removal. She did not like the Nexplanon, she was having breakthrough bleeding cramping, weight gain and nausea.Last pap smear was on 01/23/2021 and was normal.  No other gynecologic concerns.  Nexplanon Removal Patient identified, informed consent performed, consent signed.   Appropriate time out taken. Nexplanon site identified.  Area prepped in usual sterile fashon. Two ml of 1% lidocaine was used to anesthetize the area at the distal end of the implant. A small stab incision was made right beside the implant on the distal portion. The Nexplanon rod was grasped using hemostats and removed without difficulty. There was minimal blood loss.There were no complications.   Steri-strips were applied over the small incision.  A pressure bandage was applied to reduce any bruising.  The patient tolerated the procedure well and was given post procedure instructions.  Patient is planning to use combined OCPs for contraception.   Hildred Laser, MD [facility]

## 2022-05-01 ENCOUNTER — Encounter: Payer: Self-pay | Admitting: Obstetrics and Gynecology

## 2022-05-01 ENCOUNTER — Ambulatory Visit: Payer: Medicaid Other | Admitting: Obstetrics and Gynecology

## 2022-05-01 ENCOUNTER — Other Ambulatory Visit: Payer: Self-pay | Admitting: Obstetrics and Gynecology

## 2022-05-01 VITALS — BP 111/70 | HR 76 | Resp 16 | Ht 68.0 in | Wt 217.5 lb

## 2022-05-01 DIAGNOSIS — Z3046 Encounter for surveillance of implantable subdermal contraceptive: Secondary | ICD-10-CM | POA: Diagnosis not present

## 2022-05-01 DIAGNOSIS — N921 Excessive and frequent menstruation with irregular cycle: Secondary | ICD-10-CM

## 2022-05-01 DIAGNOSIS — Z975 Presence of (intrauterine) contraceptive device: Secondary | ICD-10-CM

## 2022-05-01 MED ORDER — BALCOLTRA 0.1-20 MG-MCG(21) PO TABS: 1.0000 | ORAL_TABLET | Freq: Every day | ORAL | 3 refills | Status: DC

## 2022-05-01 NOTE — Patient Instructions (Signed)
NEXPLANON Removal POST-PROCEDURE INSTRUCTIONS  You may take Ibuprofen, Aleve or Tylenol for pain if needed.  Pain should resolve within in 24 hours.  If you are switching to another method of birth control, use a back-up method for the first month.    Shower or bathe as normal.  You can remove the bandage after 24 hours.

## 2022-07-01 IMAGING — CT CT HEAD W/O CM
4 series · 17 of 47 positions shown, 19 images · non-contrast
Comparison: None.

CLINICAL DATA: Headache, sudden, severe



[Series 2: head wo · axial · 0.41mm/px · z∈[+96,+206]mm · 7 of 30 slices shown, 9 images]
[im 4/30  brain]
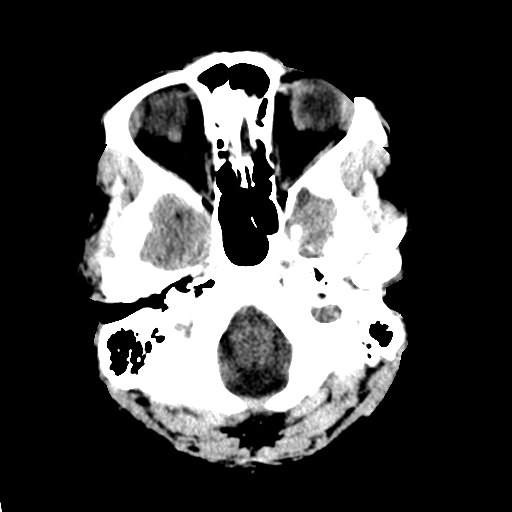
[im 4/30  bone]
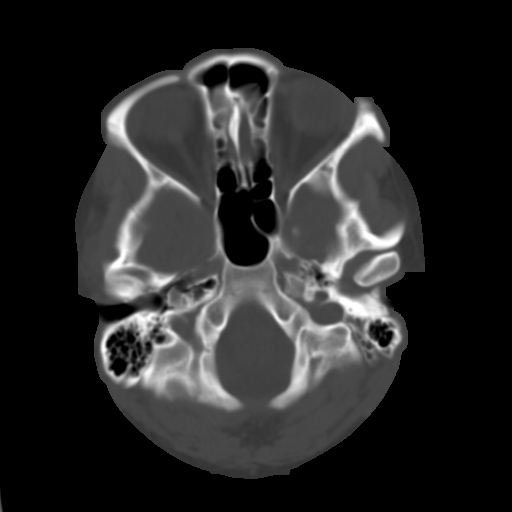
[im 8/30  brain]
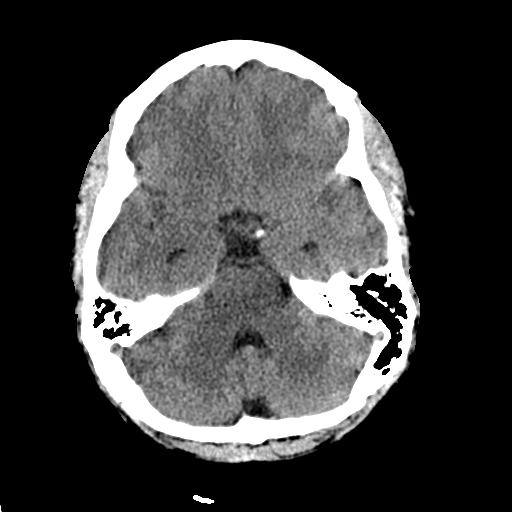
[im 11/30  brain]
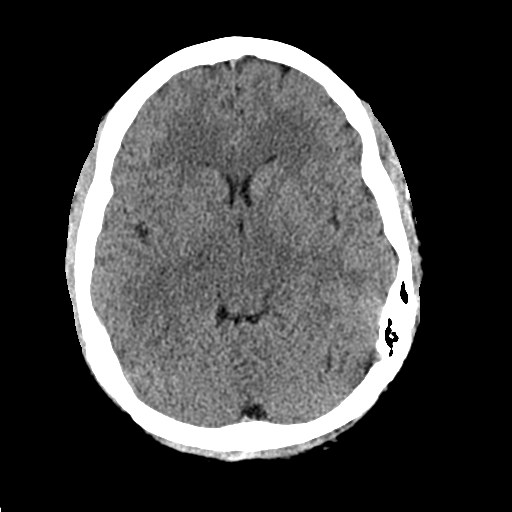
[im 15/30  brain]
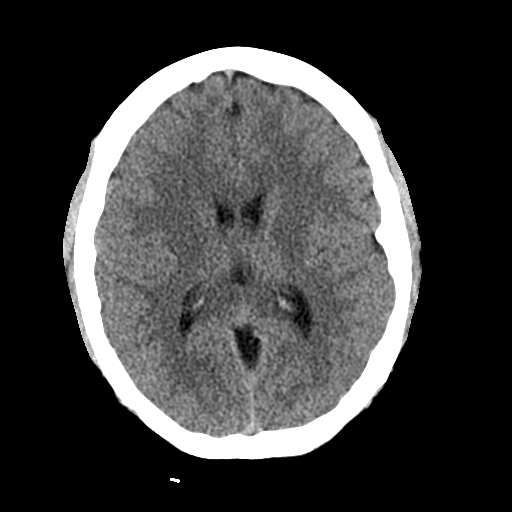
[im 19/30  brain]
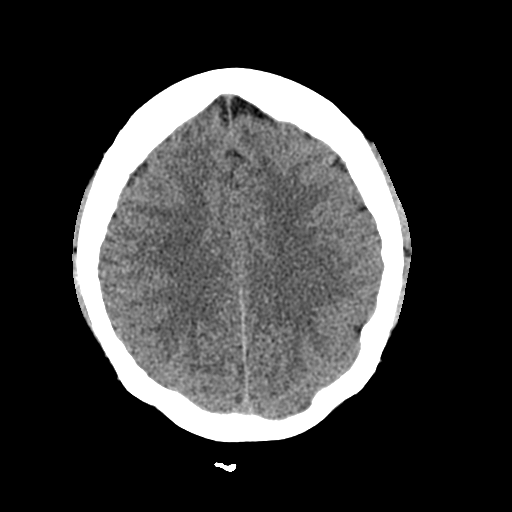
[im 19/30  bone]
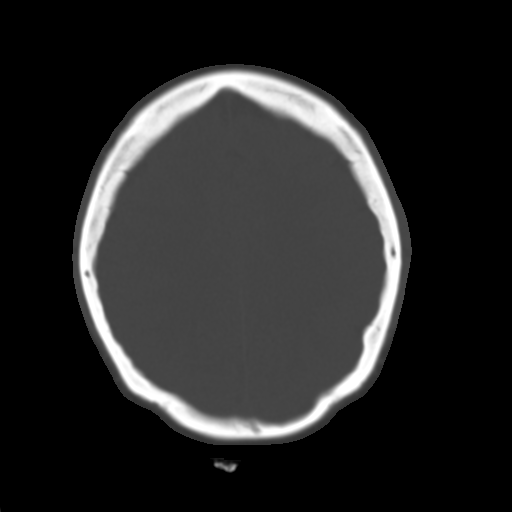
[im 22/30  brain]
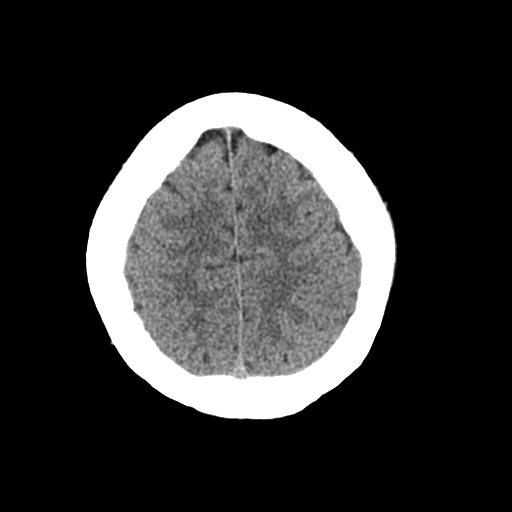
[im 26/30  brain]
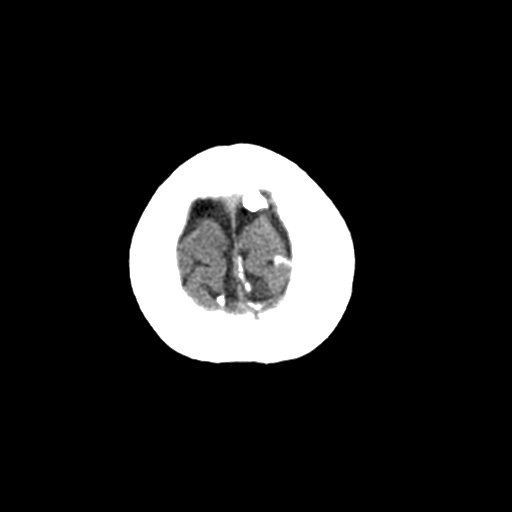

[Series 3: head bone · axial · 0.41mm/px · z∈[+95,+145]mm · 4 of 74 slices shown]
[im 8/74  bone]
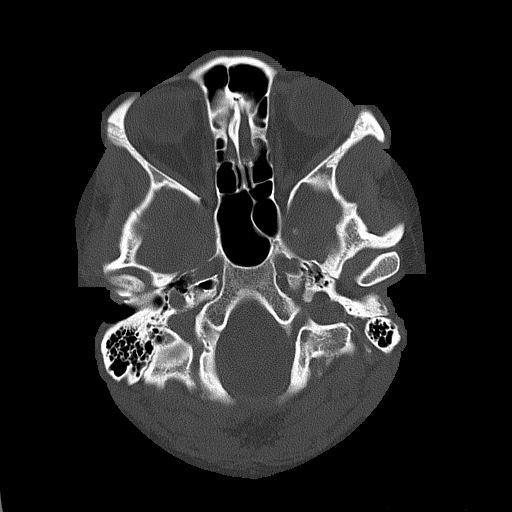
[im 15/74  bone]
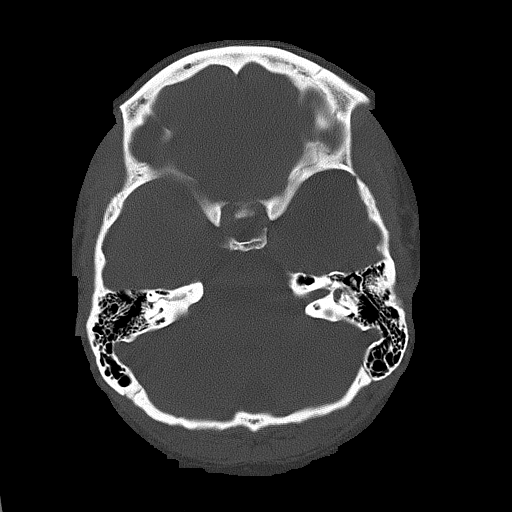
[im 22/74  bone]
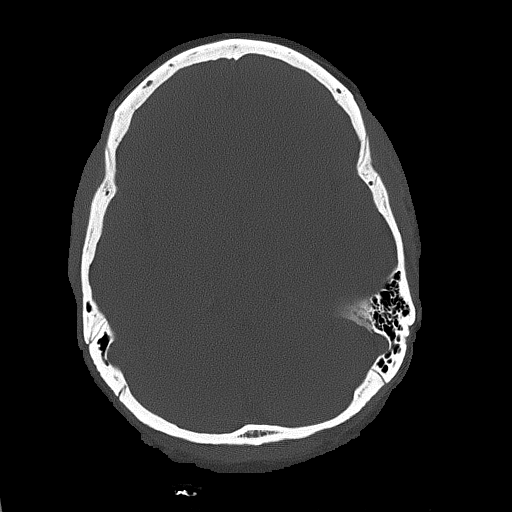
[im 33/74  bone]
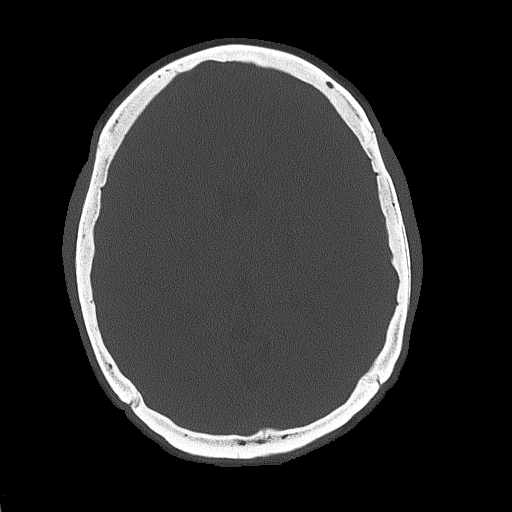

[Series 4: coronal soft tissue · coronal · 0.33mm/px · 3 of 63 slices shown]
[im 21/63  brain]
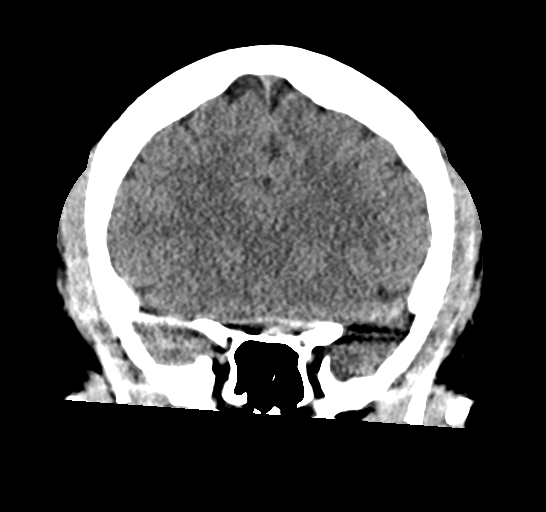
[im 28/63  brain]
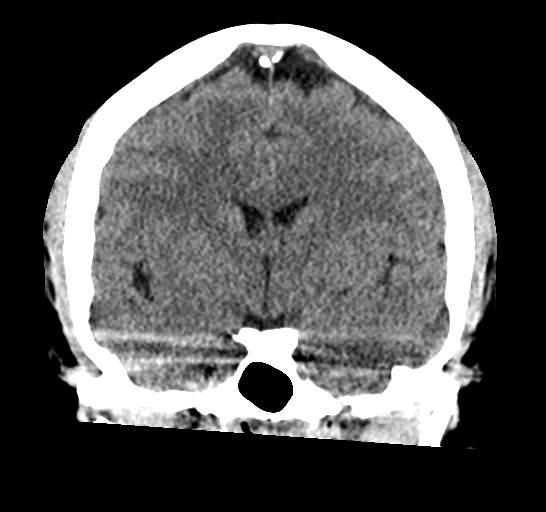
[im 35/63  brain]
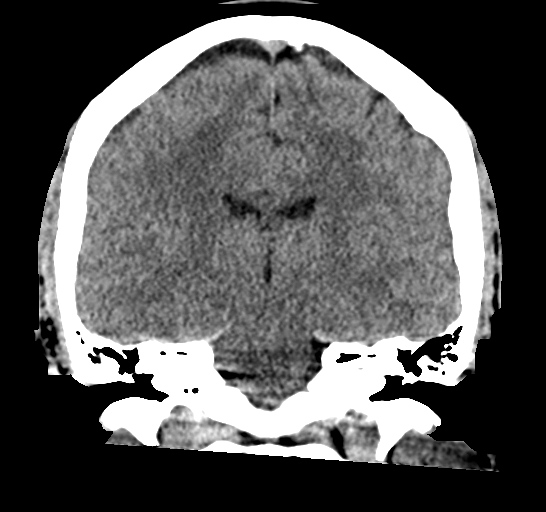

[Series 5: sagittal soft tissue · sagittal · 0.33mm/px · 3 of 53 slices shown]
[im 18/53  brain]
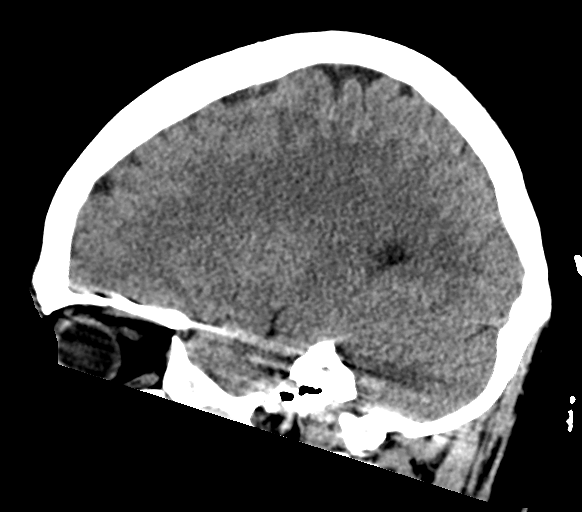
[im 27/53  brain]
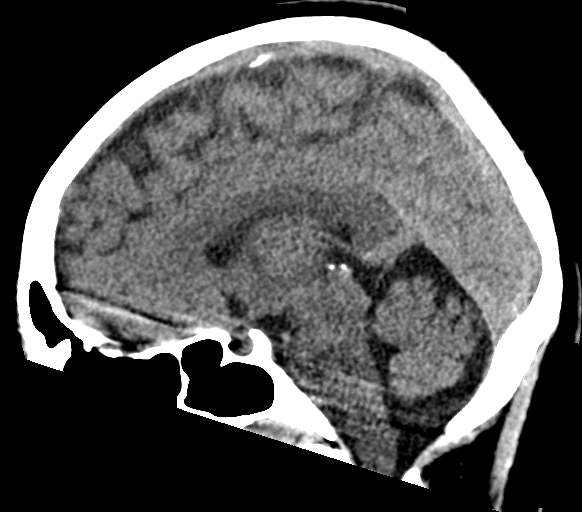
[im 35/53  brain]
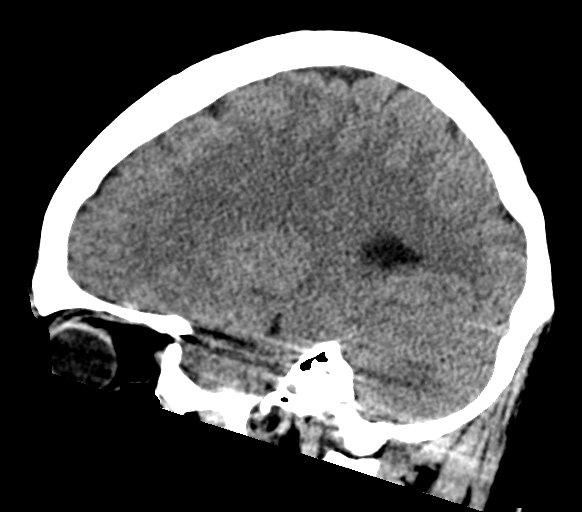

[17 of 47 positions shown; findings below may reference images not displayed]

FINDINGS: Brain: No evidence of acute infarction, hemorrhage, hydrocephalus,
extra-axial collection or mass lesion/mass effect.

Vascular: No hyperdense vessel or unexpected calcification.

Skull: Normal. Negative for fracture or focal lesion.

Sinuses/Orbits: No acute finding.

Other: None.
IMPRESSION: No acute intracranial abnormality.

## 2022-07-26 ENCOUNTER — Ambulatory Visit: Payer: Medicaid Other | Admitting: Obstetrics and Gynecology

## 2022-07-26 ENCOUNTER — Encounter: Payer: Self-pay | Admitting: Obstetrics and Gynecology

## 2022-07-26 VITALS — BP 107/64 | HR 71 | Resp 16 | Ht 68.0 in | Wt 218.0 lb

## 2022-07-26 DIAGNOSIS — Z30017 Encounter for initial prescription of implantable subdermal contraceptive: Secondary | ICD-10-CM

## 2022-07-26 MED ORDER — ETONOGESTREL 68 MG ~~LOC~~ IMPL
68.0000 mg | DRUG_IMPLANT | Freq: Once | SUBCUTANEOUS | Status: AC
  Administered 2022-07-26: 68 mg via SUBCUTANEOUS

## 2022-07-26 NOTE — Progress Notes (Signed)
      GYNECOLOGY OFFICE PROCEDURE NOTE  Kimberly Dunn is a 31 y.o. [phone] here for Nexplanon insertion.  Last pap smear was on 01/23/2021 and was normal.  No other gynecologic concerns.   Nexplanon Insertion Procedure Patient identified, informed consent performed, consent signed.   Patient does understand that irregular bleeding is a very common side effect of this medication. She was advised to have backup contraception for one week after placement. Pregnancy test in clinic today was negative.  Appropriate time out taken.  Patient's left arm was prepped and draped in the usual sterile fashion. The ruler used to measure and mark insertion area.  Patient was prepped with alcohol swab and then injected with 3 ml of 1% lidocaine.  She was prepped with betadine, Nexplanon removed from packaging,  Device confirmed in needle, then inserted full length of needle and withdrawn per handbook instructions. Nexplanon was able to palpated in the patient's arm; patient palpated the insert herself. There was minimal blood loss.  Patient insertion site covered with guaze and a pressure bandage to reduce any bruising.  The patient tolerated the procedure well and was given post procedure instructions.    Lot: [id number] Exp: 01/27/2024   Hildred Laser, MD [facility]

## 2022-07-26 NOTE — Patient Instructions (Signed)
NEXPLANON PLACEMENT POST-PROCEDURE INSTRUCTIONS  You may take Ibuprofen, Aleve or Tylenol for pain if needed.  Pain should resolve within in 24 hours.  You may have intercourse after 24 hours.  If you using this for birth control, use a back up method x 1 week.  You need to call if you have any fever, heavy bleeding, or redness at insertion site. Irregular bleeding is common the first several months after having a Nexplanonplaced. You do not need to call for this reason unless you are concerned.  Shower or bathe as normal.  You can remove the bandage after 24 hours.

## 2022-07-29 ENCOUNTER — Encounter: Payer: Self-pay | Admitting: Obstetrics and Gynecology

## 2022-10-08 DIAGNOSIS — Z6831 Body mass index (BMI) 31.0-31.9, adult: Secondary | ICD-10-CM | POA: Diagnosis not present

## 2022-10-08 DIAGNOSIS — E669 Obesity, unspecified: Secondary | ICD-10-CM | POA: Diagnosis not present

## 2022-10-11 DIAGNOSIS — E669 Obesity, unspecified: Secondary | ICD-10-CM | POA: Diagnosis not present

## 2022-10-17 DIAGNOSIS — M545 Low back pain, unspecified: Secondary | ICD-10-CM | POA: Diagnosis not present

## 2022-10-17 DIAGNOSIS — G8929 Other chronic pain: Secondary | ICD-10-CM | POA: Diagnosis not present

## 2022-10-17 DIAGNOSIS — M533 Sacrococcygeal disorders, not elsewhere classified: Secondary | ICD-10-CM | POA: Diagnosis not present

## 2022-10-28 ENCOUNTER — Encounter: Payer: Self-pay | Admitting: Obstetrics and Gynecology

## 2022-12-02 ENCOUNTER — Other Ambulatory Visit (HOSPITAL_COMMUNITY)
Admission: RE | Admit: 2022-12-02 | Discharge: 2022-12-02 | Disposition: A | Discharge status: Home / Self Care | Payer: Medicaid Other | Source: Ambulatory Visit | Attending: Obstetrics and Gynecology | Admitting: Obstetrics and Gynecology

## 2022-12-02 ENCOUNTER — Ambulatory Visit (INDEPENDENT_AMBULATORY_CARE_PROVIDER_SITE_OTHER): Payer: Medicaid Other

## 2022-12-02 VITALS — BP 101/68 | HR 74 | Ht 68.0 in | Wt 211.6 lb

## 2022-12-02 DIAGNOSIS — Z113 Encounter for screening for infections with a predominantly sexual mode of transmission: Secondary | ICD-10-CM | POA: Insufficient documentation

## 2022-12-02 NOTE — Progress Notes (Signed)
    NURSE VISIT NOTE  Subjective:    Patient ID: Kimberly Dunn, female    DOB: 1991-05-06, 32 y.o.   MRN: [id number]  HPI  Patient is a 32 y.o. [phone] female who presents for STD Screening. Patient admits to history of known exposure to STD. She is requesting vaginal cultures and blood work today.    Objective:    BP 101/68   Pulse 74   Ht 5\' 8"  (1.727 m)   Wt 211 lb 9.6 oz (96 kg)   BMI 32.17 kg/m      Assessment:   1. Screening for STD (sexually transmitted disease)       Plan:   GC and chlamydia DNA  probe sent to lab. Treatment: wait for results.  ROV prn if symptoms persist or worsen.   Marykay Lex, CMA

## 2022-12-04 LAB — CERVICOVAGINAL ANCILLARY ONLY
Bacterial Vaginitis (gardnerella): NEGATIVE
Candida Glabrata: NEGATIVE
Candida Vaginitis: NEGATIVE
Chlamydia: NEGATIVE
Comment: NEGATIVE
Comment: NEGATIVE
Comment: NEGATIVE
Comment: NEGATIVE
Comment: NEGATIVE
Comment: NORMAL
Neisseria Gonorrhea: NEGATIVE
Trichomonas: NEGATIVE

## 2022-12-04 LAB — RPR: RPR Ser Ql: REACTIVE — AB

## 2022-12-04 LAB — RPR, QUANT+TP ABS (REFLEX)
Rapid Plasma Reagin, Quant: 1:2 {titer} — ABNORMAL HIGH
T Pallidum Abs: REACTIVE — AB

## 2022-12-04 LAB — HIV ANTIBODY (ROUTINE TESTING W REFLEX): HIV Screen 4th Generation wRfx: NONREACTIVE

## 2022-12-04 LAB — HEPATITIS C ANTIBODY: Hep C Virus Ab: NONREACTIVE

## 2022-12-04 LAB — HEPATITIS B SURFACE ANTIGEN: Hepatitis B Surface Ag: NEGATIVE

## 2023-02-14 ENCOUNTER — Ambulatory Visit: Payer: Self-pay

## 2023-02-14 NOTE — Progress Notes (Deleted)
    NURSE VISIT NOTE  Subjective:    Patient ID: Kimberly Dunn, female    DOB: Jan 05, 1991, 32 y.o.   MRN: [id number]       HPI  Patient is a 32 y.o. [phone] female who presents for {UTI Symptoms:210800002} for {0-10:33138} {TIME; UNITS DAY/WEEK/MONTH:19136}.  Patient denies {UTI Symptoms:210800002}.  Patient {does/does not:33181} have a history of recurrent UTI.  Patient {does/does not:33181} have a history of pyelonephritis.    Objective:    There were no vitals taken for this visit.   Lab Review  No results found for any visits on 02/14/23.  Assessment:   No diagnosis found.   Plan:   {AOB UTI PLAN:28528:p}   Cornelius Moras, CMA

## 2024-01-15 ENCOUNTER — Other Ambulatory Visit: Payer: Self-pay

## 2024-01-15 ENCOUNTER — Emergency Department
Admission: EM | Admit: 2024-01-15 | Discharge: 2024-01-16 | Disposition: A | Discharge status: Home / Self Care | Payer: Self-pay | Attending: Emergency Medicine | Admitting: Emergency Medicine

## 2024-01-15 ENCOUNTER — Emergency Department: Payer: Self-pay

## 2024-01-15 DIAGNOSIS — R1031 Right lower quadrant pain: Secondary | ICD-10-CM | POA: Diagnosis not present

## 2024-01-15 DIAGNOSIS — R109 Unspecified abdominal pain: Secondary | ICD-10-CM

## 2024-01-15 LAB — COMPREHENSIVE METABOLIC PANEL WITH GFR
ALT: 18 U/L (ref 0–44)
AST: 21 U/L (ref 15–41)
Albumin: 4 g/dL (ref 3.5–5.0)
Alkaline Phosphatase: 41 U/L (ref 38–126)
Anion gap: 8 (ref 5–15)
BUN: 12 mg/dL (ref 6–20)
CO2: 24 mmol/L (ref 22–32)
Calcium: 9.1 mg/dL (ref 8.9–10.3)
Chloride: 107 mmol/L (ref 98–111)
Creatinine, Ser: 0.75 mg/dL (ref 0.44–1.00)
GFR, Estimated: >60 mL/min (ref >60)
Glucose, Bld: 100 mg/dL — ABNORMAL HIGH (ref 70–99)
Potassium: 3.6 mmol/L (ref 3.5–5.1)
Sodium: 139 mmol/L (ref 135–145)
Total Bilirubin: 0.9 mg/dL (ref 0.0–1.2)
Total Protein: 7.6 g/dL (ref 6.5–8.1)

## 2024-01-15 LAB — CBC
HCT: 33.2 % — ABNORMAL LOW (ref 36.0–46.0)
Hemoglobin: 10.9 g/dL — ABNORMAL LOW (ref 12.0–15.0)
MCH: 26.9 pg (ref 26.0–34.0)
MCHC: 32.8 g/dL (ref 30.0–36.0)
MCV: 82 fL (ref 80.0–100.0)
Platelets: 314 10*3/uL (ref 150–400)
RBC: 4.05 MIL/uL (ref 3.87–5.11)
RDW: 13.7 % (ref 11.5–15.5)
WBC: 7.3 10*3/uL (ref 4.0–10.5)
nRBC: 0 % (ref 0.0–0.2)

## 2024-01-15 LAB — LIPASE, BLOOD: Lipase: 48 U/L (ref 11–51)

## 2024-01-15 LAB — POC URINE PREG, ED: Preg Test, Ur: NEGATIVE

## 2024-01-15 LAB — URINALYSIS, ROUTINE W REFLEX MICROSCOPIC
Bacteria, UA: NONE SEEN
Bilirubin Urine: NEGATIVE
Glucose, UA: NEGATIVE mg/dL
Ketones, ur: NEGATIVE mg/dL
Leukocytes,Ua: NEGATIVE
Nitrite: NEGATIVE
Protein, ur: NEGATIVE mg/dL
Specific Gravity, Urine: 1.012 (ref 1.005–1.030)
pH: 5 (ref 5.0–8.0)

## 2024-01-15 MED ORDER — IOHEXOL 300 MG/ML  SOLN
100.0000 mL | Freq: Once | INTRAMUSCULAR | Status: AC | PRN
  Administered 2024-01-15: 100 mL via INTRAVENOUS

## 2024-01-15 MED ORDER — ONDANSETRON HCL 4 MG/2ML IJ SOLN: 4.0000 mg | Freq: Once | INTRAMUSCULAR | Status: DC

## 2024-01-15 MED ORDER — MORPHINE SULFATE (PF) 2 MG/ML IV SOLN: 2.0000 mg | Freq: Once | INTRAVENOUS | Status: DC

## 2024-01-15 MED ORDER — SODIUM CHLORIDE 0.9 % IV BOLUS
1000.0000 mL | Freq: Once | INTRAVENOUS | Status: AC
  Administered 2024-01-15: 1000 mL via INTRAVENOUS

## 2024-01-15 NOTE — ED Provider Notes (Signed)
 11:45 PM  Assumed care at shift change.  Patient here with right lower quadrant pain that started after lifting weights during a workout.  Labs, urine unremarkable.  CT of the abdomen pelvis shows normal appendix.  Transvaginal ultrasound pending.  3:30 AM  Pt's transvaginal ultrasound reviewed and interpreted by myself and the radiologist and shows normal right ovary with normal blood flow.  She does have a left ovarian cyst but all of the pain is localized in the right lower abdomen.  She is not having any GU symptoms.  Agree with previous provider that symptoms likely consistent with muscle strain.  Recommended Tylenol , Motrin .  Had offered her stronger pain medication for home which she declines.  Will provide her with an abdominal binder per her request for pain control.  Recommended rest, no heavy lifting or intense physical activity until symptoms significantly improved.   At this time, I do not feel there is any life-threatening condition present. I reviewed all nursing notes, vitals, pertinent previous records.  All lab and urine results, EKGs, imaging ordered have been independently reviewed and interpreted by myself.  I reviewed all available radiology reports from any imaging ordered this visit.  Based on my assessment, I feel the patient is safe to be discharged home without further emergent workup and can continue workup as an outpatient as needed. Discussed all findings, treatment plan as well as usual and customary return precautions.  They verbalize understanding and are comfortable with this plan.  Outpatient follow-up has been provided as needed.  All questions have been answered.    Daiel Strohecker, Clover Dao, DO 01/16/24 [phone]

## 2024-01-15 NOTE — ED Provider Notes (Signed)
 [facility] Provider Note    Event Date/Time   First MD Initiated Contact with Patient 01/15/24 2122     (approximate)   History   Abdominal Pain   HPI  Kimberly Dunn is a 33 y.o. female who presents to the emergency department today with concerns for abdominal pain.  It started today.  The patient states that she first noticed some discomfort when she was lifting.  She was able to finish her workout.  Afterwards however she started having more pain.  Initially the pain was in the center of her abdomen however it is now more in the right lower quadrant.  She describes it as a cutting sensation.  She denies any change in bowel or bladder.  No nausea or vomiting.  Denies similar pain in the past.     Physical Exam   Triage Vital Signs: ED Triage Vitals  Encounter Vitals Group     BP 01/15/24 2037 138/77     Systolic BP Percentile --      Diastolic BP Percentile --      Pulse Rate 01/15/24 2037 77     Resp 01/15/24 2037 18     Temp 01/15/24 2037 98.9 F (37.2 C)     Temp Source 01/15/24 2037 Oral     SpO2 01/15/24 2037 100 %     Weight 01/15/24 2036 211 lb (95.7 kg)     Height 01/15/24 2036 5\' 8"  (1.727 m)     Head Circumference --      Peak Flow --      Pain Score 01/15/24 2036 5     Pain Loc --      Pain Education --      Exclude from Growth Chart --     Most recent vital signs: Vitals:   01/15/24 2037  BP: 138/77  Pulse: 77  Resp: 18  Temp: 98.9 F (37.2 C)  SpO2: 100%   General: Awake, alert, oriented. CV:  Good peripheral perfusion. Regular rate and rhythm. Resp:  Normal effort. Lungs clear. Abd:  No distention. Tender to palpation in the right lower quadrant.   ED Results / Procedures / Treatments   Labs (all labs ordered are listed, but only abnormal results are displayed) Labs Reviewed  COMPREHENSIVE METABOLIC PANEL WITH GFR - Abnormal; Notable for the following components:      Result Value   Glucose, Bld 100 (*)     All other components within normal limits  CBC - Abnormal; Notable for the following components:   Hemoglobin 10.9 (*)    HCT 33.2 (*)    All other components within normal limits  URINALYSIS, ROUTINE W REFLEX MICROSCOPIC - Abnormal; Notable for the following components:   Color, Urine YELLOW (*)    APPearance CLEAR (*)    Hgb urine dipstick LARGE (*)    All other components within normal limits  LIPASE, BLOOD  POC URINE PREG, ED     EKG  None   RADIOLOGY I independently interpreted and visualized the CT abd/pel. My interpretation: No free air Radiology interpretation:  IMPRESSION:  No acute abnormality in the abdomen or pelvis. Normal appendix.      PROCEDURES:  Critical Care performed: No    MEDICATIONS ORDERED IN ED: Medications - No data to display   IMPRESSION / MDM / ASSESSMENT AND PLAN / ED COURSE  I reviewed the triage vital signs and the nursing notes.  Differential diagnosis includes, but is not limited to, appendicitis, kidney stone, UTI, ovarian pathology, muscle strain  Patient's presentation is most consistent with acute presentation with potential threat to life or bodily function.   Patient presented to the emergency department today because of concerns for right lower quadrant pain.  Pain initially was more generalized.  On exam she is tender in the right lower quadrant without rebound or guarding.  Blood work without concerning leukocytosis, UA with some red blood cells however patient is currently menstruating.  Did obtain a CT scan given concern for possible appendicitis.  This did show a normal appendix.  Did show a large left ovarian cyst.  Will get ultrasound to evaluate for potential ovarian torsion.  If negative I do think it be reasonable for patient be discharged.  Could potentially be muscle strain given that she was lifting when the pain started.  I did discuss this possibility with the patient.   FINAL  CLINICAL IMPRESSION(S) / ED DIAGNOSES   Final diagnoses:  Abdominal pain, unspecified abdominal location        Rx / DC Orders     Note:  This document was prepared using Dragon voice recognition software and may include unintentional dictation errors.    Marylynn Soho, MD 01/15/24 [phone]

## 2024-01-15 NOTE — ED Triage Notes (Signed)
 Pt reports RLQ abdominal pain that started around 12pm today after working out at the gym today. She reports it feels like "cuts" and the pain is worse with any movement. + nausea, denies vomiting or diarrhea.

## 2024-01-16 ENCOUNTER — Emergency Department: Payer: Self-pay

## 2024-01-16 NOTE — Discharge Instructions (Signed)
You may alternate Tylenol 1000 mg every 6 hours as needed for pain, fever and Ibuprofen 800 mg every 6-8 hours as needed for pain, fever.  Please take Ibuprofen with food.  Do not take more than 4000 mg of Tylenol (acetaminophen) in a 24 hour period. ° °

## 2024-01-16 NOTE — ED Notes (Signed)
 No abdominal binders in ED, charge RN calling central supply
